# Patient Record
Sex: Female | Born: 1990 | Race: Black or African American | Hispanic: No | Marital: Single | Smoking: Former smoker
Health system: Southern US, Community
[De-identification: ages and names within clinical notes are randomized; demographics above are authoritative.]

## PROBLEM LIST (undated history)

## (undated) ENCOUNTER — Inpatient Hospital Stay (HOSPITAL_COMMUNITY): Payer: Self-pay

## (undated) DIAGNOSIS — E66813 Obesity, class 3: Secondary | ICD-10-CM

## (undated) DIAGNOSIS — Z973 Presence of spectacles and contact lenses: Secondary | ICD-10-CM

## (undated) DIAGNOSIS — Q513 Bicornate uterus: Secondary | ICD-10-CM

## (undated) DIAGNOSIS — S82153A Displaced fracture of unspecified tibial tuberosity, initial encounter for closed fracture: Secondary | ICD-10-CM

## (undated) DIAGNOSIS — O24419 Gestational diabetes mellitus in pregnancy, unspecified control: Secondary | ICD-10-CM

## (undated) DIAGNOSIS — O139 Gestational [pregnancy-induced] hypertension without significant proteinuria, unspecified trimester: Secondary | ICD-10-CM

## (undated) DIAGNOSIS — O1490 Unspecified pre-eclampsia, unspecified trimester: Secondary | ICD-10-CM

## (undated) HISTORY — PX: OPEN REDUCTION INTERNAL FIXATION (ORIF) TIBIAL TUBERCLE: SHX6482

## (undated) HISTORY — PX: WISDOM TOOTH EXTRACTION: SHX21

## (undated) HISTORY — DX: Gestational diabetes mellitus in pregnancy, unspecified control: O24.419

## (undated) HISTORY — DX: Gestational (pregnancy-induced) hypertension without significant proteinuria, unspecified trimester: O13.9

## (undated) HISTORY — DX: Unspecified pre-eclampsia, unspecified trimester: O14.90

---
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
## 2013-03-04 ENCOUNTER — Encounter (HOSPITAL_COMMUNITY): Payer: Self-pay | Admitting: Emergency Medicine

## 2013-03-04 ENCOUNTER — Emergency Department (HOSPITAL_COMMUNITY)
Admission: EM | Admit: 2013-03-04 | Discharge: 2013-03-04 | Disposition: A | Discharge status: Home / Self Care | Payer: Self-pay | Attending: Emergency Medicine | Admitting: Emergency Medicine

## 2013-03-04 DIAGNOSIS — F17200 Nicotine dependence, unspecified, uncomplicated: Secondary | ICD-10-CM | POA: Insufficient documentation

## 2013-03-04 DIAGNOSIS — K047 Periapical abscess without sinus: Secondary | ICD-10-CM | POA: Insufficient documentation

## 2013-03-04 MED ORDER — PENICILLIN V POTASSIUM 500 MG PO TABS: 500.0000 mg | ORAL_TABLET | Freq: Four times a day (QID) | ORAL | Status: AC

## 2013-03-04 MED ORDER — HYDROCODONE-ACETAMINOPHEN 5-325 MG PO TABS: 1.0000 | ORAL_TABLET | ORAL | Status: DC | PRN

## 2013-03-04 MED ORDER — HYDROCODONE-ACETAMINOPHEN 5-325 MG PO TABS
2.0000 | ORAL_TABLET | Freq: Once | ORAL | Status: AC
  Administered 2013-03-04: 2 via ORAL
  Filled 2013-03-04: qty 2

## 2013-03-04 NOTE — ED Notes (Signed)
Pt presents with swelling to right jaw area onset this morning. Pt denies injury. Reports that she had a toothache on same side earlier this week but no longer has the toothache. Pt tried over the counter pain reliever without relief.

## 2013-03-04 NOTE — ED Provider Notes (Signed)
CSN: [id number]     Arrival date & time 03/04/13  1152 History  This chart was scribed for non-physician practitioner, Coral Ceo, PA-C working with Juliet Rude. Rubin Payor, MD by Greggory Stallion, ED scribe. This patient was seen in room TR06C/TR06C and the patient's care was started at 1:41 PM.   Chief Complaint  Patient presents with  . Facial Swelling   The history is provided by the patient. No language interpreter was used.   HPI Comments: Tonya Walls is a 22 y.o. female with no PMH who presents to the Emergency Department complaining of swelling to her right jaw area that started earlier this morning. Denies injury or trauma to her face. States she had a toothache on the same side earlier this week but denies current dental pain. Pt has tried acetaminophen with no relief. Denies fever, chills, diaphoresis, abdominal pain, nausea, emesis, ear pain, sore throat, difficulty swallowing, cough, eye pain, neck pain, headache.  History reviewed. No pertinent past medical history. History reviewed. No pertinent past surgical history. No family history on file. History  Substance Use Topics  . Smoking status: Current Every Day Smoker  . Smokeless tobacco: Not on file  . Alcohol Use: No   OB History   Grav Para Term Preterm Abortions TAB SAB Ect Mult Living                 Review of Systems  Constitutional: Negative for fever, chills and diaphoresis.  HENT: Positive for dental problem and facial swelling (right jaw). Negative for ear pain, sore throat and trouble swallowing.   Eyes: Negative for pain.  Respiratory: Negative for cough.   Gastrointestinal: Negative for nausea, vomiting and abdominal pain.  Musculoskeletal: Negative for neck pain.  Neurological: Negative for headaches.  All other systems reviewed and are negative.   Allergies  Review of patient's allergies indicates no known allergies.  Home Medications  No current outpatient prescriptions on file.  BP 122/65   Pulse 114  Temp(Src) 99.6 F (37.6 C) (Oral)  Resp 20  Ht 5\' 2"  (1.575 m)  Wt 248 lb 5 oz (112.634 kg)  BMI 45.41 kg/m2  SpO2 98%  LMP 02/26/2013  Filed Vitals:   03/04/13 1202 03/04/13 1405  BP: 122/65 119/69  Pulse: 114 98  Temp: 99.6 F (37.6 C) 99.4 F (37.4 C)  TempSrc: Oral Oral  Resp: 20 15  Height: 5\' 2"  (1.575 m)   Weight: 248 lb 5 oz (112.634 kg)   SpO2: 98% 100%    Physical Exam  Nursing note and vitals reviewed. Constitutional: She is oriented to person, place, and time. She appears well-developed and well-nourished. No distress.  HENT:  Head: Normocephalic and atraumatic.    Right Ear: External ear normal.  Left Ear: External ear normal.  Nose: Nose normal.  Mouth/Throat: Oropharynx is clear and moist. No oropharyngeal exudate.    Tenderness to palpation to right mandible with surrounding edema, which does not go past the jaw line.  Dental cary present to the right lower molar.  Palpable dental abscess to the inner right lower jaw, which is indurated with mild fluctuance.  TM's gray and translucent.  No trismus.   Eyes: Conjunctivae and EOM are normal. Pupils are equal, round, and reactive to light.  Neck: Normal range of motion. Neck supple. No tracheal deviation present.  No submental fullness.  No LAD throughout.  No tenderness or edema to the neck throughout  Cardiovascular: Normal rate, regular rhythm and normal heart sounds.  Exam reveals no gallop and no friction rub.   No murmur heard. Pulmonary/Chest: Effort normal and breath sounds normal. No respiratory distress. She has no wheezes. She has no rhonchi. She has no rales. She exhibits no tenderness.  Abdominal: Soft. There is no tenderness.  Musculoskeletal: Normal range of motion. She exhibits no edema and no tenderness.  Neurological: She is alert and oriented to person, place, and time.  Skin: Skin is warm and dry.  Psychiatric: She has a normal mood and affect. Her behavior is normal.    ED  Course  Procedures (including critical care time)  DIAGNOSTIC STUDIES: Oxygen Saturation is 98% on RA, normal by my interpretation.    COORDINATION OF CARE: 1:47 PM-Discussed treatment plan which includes I&D and an antibiotic with pt at bedside and pt agreed to plan. Advised pt to follow up with a dentist.  INCISION AND DRAINAGE Performed by: Coral Ceo, PA-C Consent: Verbal consent obtained. Risks and benefits: risks, benefits and alternatives were discussed Type: abscess  Body area: right mandible  Anesthesia: local infiltration  Incision was made with a scalpel 11 blade   Local anesthetic: 0.5% bupivacaine   Anesthetic total: 0.5 ml  Drainage amount: none  Packing material: 1/4 in iodoform gauze  Patient tolerance: Patient tolerated the procedure well with no immediate complications.  Dental block successful for pain reduction.    Labs Review Labs Reviewed - No data to display Imaging Review No results found.  EKG Interpretation   None       MDM   Tonya Walls is a 22 y.o. female with no PMH who presents to the Emergency Department complaining of swelling to her right jaw area that started earlier this morning.    Rechecks  3:00 PM = Pain improved after dental block and Vicodin.     Etiology of facial swelling likely due to dental cary with developing abscess.  Unable to drain abscess, however, dental block led to a reduction in pain.  No concerning signs/symptoms for Ludwig's Angina at this time.  Patient afebrile and non-toxic in appearance.  Patient instructed to follow-up with a dentist for further evaluation and management.  Resources provided.  Return precautions were given.  Patient in agreement with discharge and plan. Visitor in ED to drive patient home.     Discharge Medication List as of 03/04/2013  2:54 PM    START taking these medications   Details  HYDROcodone-acetaminophen (NORCO/VICODIN) 5-325 MG per tablet Take 1-2 tablets by mouth  every 4 (four) hours as needed., Starting 03/04/2013, Until Discontinued, Print    penicillin v potassium (VEETID) 500 MG tablet Take 1 tablet (500 mg total) by mouth 4 (four) times daily., Starting 03/04/2013, Last dose on Sun 03/11/13, Print       Final impressions: 1. Dental abscess      Thomasenia Sales   I personally performed the services described in this documentation, which was scribed in my presence. The recorded information has been reviewed and is accurate.        Jillyn Ledger, PA-C 03/09/13 0030

## 2013-03-12 NOTE — ED Provider Notes (Signed)
Medical screening examination/treatment/procedure(s) were performed by non-physician practitioner and as supervising physician I was immediately available for consultation/collaboration.  EKG Interpretation   None        Kippy Melena R. Kross Swallows, MD 03/12/13 1542 

## 2013-12-07 ENCOUNTER — Encounter (HOSPITAL_COMMUNITY): Payer: Self-pay | Admitting: Emergency Medicine

## 2013-12-07 ENCOUNTER — Emergency Department (HOSPITAL_COMMUNITY)
Admission: EM | Admit: 2013-12-07 | Discharge: 2013-12-07 | Disposition: A | Discharge status: Home / Self Care | Payer: Self-pay | Attending: Emergency Medicine | Admitting: Emergency Medicine

## 2013-12-07 DIAGNOSIS — Z3202 Encounter for pregnancy test, result negative: Secondary | ICD-10-CM | POA: Insufficient documentation

## 2013-12-07 DIAGNOSIS — R109 Unspecified abdominal pain: Secondary | ICD-10-CM | POA: Insufficient documentation

## 2013-12-07 DIAGNOSIS — N926 Irregular menstruation, unspecified: Secondary | ICD-10-CM | POA: Insufficient documentation

## 2013-12-07 DIAGNOSIS — R112 Nausea with vomiting, unspecified: Secondary | ICD-10-CM | POA: Insufficient documentation

## 2013-12-07 DIAGNOSIS — Z87891 Personal history of nicotine dependence: Secondary | ICD-10-CM | POA: Insufficient documentation

## 2013-12-07 DIAGNOSIS — Z79899 Other long term (current) drug therapy: Secondary | ICD-10-CM | POA: Insufficient documentation

## 2013-12-07 DIAGNOSIS — R42 Dizziness and giddiness: Secondary | ICD-10-CM | POA: Insufficient documentation

## 2013-12-07 LAB — COMPREHENSIVE METABOLIC PANEL
ALBUMIN: 3.7 g/dL (ref 3.5–5.2)
ALT: 15 U/L (ref 0–35)
ANION GAP: 12 (ref 5–15)
AST: 14 U/L (ref 0–37)
Alkaline Phosphatase: 53 U/L (ref 39–117)
BUN: 15 mg/dL (ref 6–23)
CALCIUM: 9.2 mg/dL (ref 8.4–10.5)
CHLORIDE: 104 meq/L (ref 96–112)
CO2: 21 mEq/L (ref 19–32)
CREATININE: 0.61 mg/dL (ref 0.50–1.10)
GFR calc Af Amer: >90 mL/min (ref >90)
GFR calc non Af Amer: >90 mL/min (ref >90)
Glucose, Bld: 95 mg/dL (ref 70–99)
Potassium: 4 mEq/L (ref 3.7–5.3)
Sodium: 137 mEq/L (ref 137–147)
TOTAL PROTEIN: 7.5 g/dL (ref 6.0–8.3)
Total Bilirubin: 0.3 mg/dL (ref 0.3–1.2)

## 2013-12-07 LAB — CBC WITH DIFFERENTIAL/PLATELET
BASOS ABS: 0 10*3/uL (ref 0.0–0.1)
BASOS PCT: 0 % (ref 0–1)
EOS ABS: 0.1 10*3/uL (ref 0.0–0.7)
Eosinophils Relative: 2 % (ref 0–5)
HEMATOCRIT: 32.1 % — AB (ref 36.0–46.0)
HEMOGLOBIN: 10 g/dL — AB (ref 12.0–15.0)
Lymphocytes Relative: 26 % (ref 12–46)
Lymphs Abs: 1.3 10*3/uL (ref 0.7–4.0)
MCH: 24 pg — AB (ref 26.0–34.0)
MCHC: 31.2 g/dL (ref 30.0–36.0)
MCV: 77.2 fL — ABNORMAL LOW (ref 78.0–100.0)
MONO ABS: 0.4 10*3/uL (ref 0.1–1.0)
MONOS PCT: 8 % (ref 3–12)
NEUTROS ABS: 3.1 10*3/uL (ref 1.7–7.7)
Neutrophils Relative %: 64 % (ref 43–77)
Platelets: 345 10*3/uL (ref 150–400)
RBC: 4.16 MIL/uL (ref 3.87–5.11)
RDW: 16.2 % — AB (ref 11.5–15.5)
WBC: 4.9 10*3/uL (ref 4.0–10.5)

## 2013-12-07 LAB — URINALYSIS, ROUTINE W REFLEX MICROSCOPIC
BILIRUBIN URINE: NEGATIVE
Glucose, UA: NEGATIVE mg/dL
Hgb urine dipstick: NEGATIVE
Ketones, ur: 15 mg/dL — AB
NITRITE: NEGATIVE
PH: 5.5 (ref 5.0–8.0)
Protein, ur: NEGATIVE mg/dL
SPECIFIC GRAVITY, URINE: 1.03 (ref 1.005–1.030)
UROBILINOGEN UA: 0.2 mg/dL (ref 0.0–1.0)

## 2013-12-07 LAB — POC URINE PREG, ED: PREG TEST UR: NEGATIVE

## 2013-12-07 LAB — URINE MICROSCOPIC-ADD ON

## 2013-12-07 MED ORDER — OMEPRAZOLE 20 MG PO CPDR: 20.0000 mg | DELAYED_RELEASE_CAPSULE | Freq: Every day | ORAL | Status: DC

## 2013-12-07 MED ORDER — SODIUM CHLORIDE 0.9 % IV BOLUS (SEPSIS)
1000.0000 mL | Freq: Once | INTRAVENOUS | Status: AC
  Administered 2013-12-07: 1000 mL via INTRAVENOUS

## 2013-12-07 MED ORDER — ONDANSETRON HCL 4 MG/2ML IJ SOLN
4.0000 mg | Freq: Once | INTRAMUSCULAR | Status: AC
  Administered 2013-12-07: 4 mg via INTRAVENOUS
  Filled 2013-12-07: qty 2

## 2013-12-07 NOTE — ED Provider Notes (Signed)
Pt seen and evaluated.  Most symptoms arising since missing period for last 5-7 days.  Benign abdomen.  Not pregnant. Plan is PPI, expectant management.  PCP f/U.  Pt discussed with Resident. I agree with Dr. Roxan Hockey assessment and plan.  Rolland Porter, MD 12/07/13 1425

## 2013-12-07 NOTE — ED Notes (Signed)
Pt reports she missed her period. States for the last few weeks has been having abdominal pain, breast tenderness and nausea. Pt awake, alert, oriented x4, VSS.

## 2013-12-07 NOTE — ED Provider Notes (Signed)
CSN: [id number]     Arrival date & time 12/07/13  1122 History   First MD Initiated Contact with Patient 12/07/13 1151     Chief Complaint  Patient presents with  . Abdominal Pain     (Consider location/radiation/quality/duration/timing/severity/associated sxs/prior Treatment) Patient is a 23 y.o. female presenting with abdominal pain.  Abdominal Pain Associated symptoms: nausea and vomiting   Associated symptoms: no fever and no vaginal discharge    Pt is a 23 y/o female w/ no significant PMHx who presents to the ED w/ abdominal pain, vomiting, breast tenderness, and light headedness. Pt believes that she could be pregnant but has not used a home pregnancy test. Her last menstrual cycle was August 20th, and they are usually regular. She states she has been vomiting clear liquids x 1 week. Her breast have been tender x 1 week as well and she denies recent trauma to chest wall area or previous hx of breast tenderness. She also has been feeling light headed x 1 week and feels that the room is spinning. She denies any changes in appetite and has been able to keep down fluids. Her abdominal pain originated at there left lower quadrant and is not located along lower abdomen which has been present for 1 week as well.  She is sexually active and does not use condoms consistently, she is not on birth control. Denies vaginal discharge, fevers, and chills.    History reviewed. No pertinent past medical history. History reviewed. No pertinent past surgical history. No family history on file. History  Substance Use Topics  . Smoking status: Former Smoker    Quit date: 11/13/2012  . Smokeless tobacco: Not on file  . Alcohol Use: No   OB History   Grav Para Term Preterm Abortions TAB SAB Ect Mult Living                 Review of Systems  Constitutional: Negative for fever and appetite change.  Gastrointestinal: Positive for nausea, vomiting and abdominal pain.  Genitourinary: Negative for vaginal  discharge.  Neurological: Positive for light-headedness.      Allergies  Review of patient's allergies indicates no known allergies.  Home Medications   Prior to Admission medications   Medication Sig Start Date End Date Taking? Authorizing Provider  ibuprofen (ADVIL,MOTRIN) 200 MG tablet Take 600 mg by mouth every 6 (six) hours as needed (pain).   Yes Historical Provider, MD  omeprazole (PRILOSEC) 20 MG capsule Take 1 capsule (20 mg total) by mouth daily. 12/07/13   Gara Kroner, MD   BP 109/58  Pulse 84  Temp(Src) 98.5 F (36.9 C) (Oral)  Resp 20  SpO2 100%  LMP 11/01/2013 Physical Exam  Constitutional: She appears well-developed and well-nourished. No distress.  HENT:  Right Ear: External ear normal.  Left Ear: External ear normal.  Cardiovascular: Regular rhythm.   No murmur heard. Tachycardic   Pulmonary/Chest: Effort normal and breath sounds normal. She has no wheezes.  Abdominal: Soft. Bowel sounds are normal. There is tenderness (generalized). There is no rebound and no guarding.  Musculoskeletal: She exhibits no edema.    ED Course  Procedures (including critical care time) Labs Review Labs Reviewed  CBC WITH DIFFERENTIAL - Abnormal; Notable for the following:    Hemoglobin 10.0 (*)    HCT 32.1 (*)    MCV 77.2 (*)    MCH 24.0 (*)    RDW 16.2 (*)    All other components within normal limits  URINALYSIS, ROUTINE  W REFLEX MICROSCOPIC - Abnormal; Notable for the following:    APPearance HAZY (*)    Ketones, ur 15 (*)    Leukocytes, UA SMALL (*)    All other components within normal limits  URINE MICROSCOPIC-ADD ON - Abnormal; Notable for the following:    Squamous Epithelial / LPF MANY (*)    Bacteria, UA MANY (*)    All other components within normal limits  COMPREHENSIVE METABOLIC PANEL  POC URINE PREG, ED      MDM   Final diagnoses:  Irregular menstrual cycle  Abdominal pain in female    Pt presents to the ED w/ abdominal pain, breast  tenderness, light headedness, and vomiting x 1 week. Urine pregnancy test was negative thus r/o's ectopy and molar pregnancy. Pt could be having an irregular menstrual cycle since she is ~5 days late on her period thus causing the above symptoms, or GERD. Will check CBC and CMP. If WNL pt stable for d/c home w/ reflux meds.   UA positive for LE but likely contaminated considering many squamous epi cells were seen as well and pt denies dysuria. Hgb low but stable. Will d/c home w/ prilosec  once a day.    Gara Kroner, MD 12/07/13 [phone]

## 2013-12-07 NOTE — Discharge Instructions (Signed)
You can start taking omeprazole  once a day for acid reflux that could be causing you to feel nauseous.      Gastroesophageal Reflux Disease, Adult Gastroesophageal reflux disease (GERD) happens when acid from your stomach goes into your food pipe (esophagus). The acid can cause a burning feeling in your chest. Over time, the acid can make small holes (ulcers) in your food pipe.  HOME CARE  Ask your doctor for advice about:  Losing weight.  Quitting smoking.  Alcohol use.  Avoid foods and drinks that make your problems worse. You may want to avoid:  Caffeine and alcohol.  Chocolate.  Mints.  Garlic and onions.  Spicy foods.  Citrus fruits, such as oranges, lemons, or limes.  Foods that contain tomato, such as sauce, chili, salsa, and pizza.  Fried and fatty foods.  Avoid lying down for 3 hours before you go to bed or before you take a nap.  Eat small meals often, instead of large meals.  Wear loose-fitting clothing. Do not wear anything tight around your waist.  Raise (elevate) the head of your bed 6 to 8 inches with wood blocks. Using extra pillows does not help.  Only take medicines as told by your doctor.  Do not take aspirin or ibuprofen. GET HELP RIGHT AWAY IF:   You have pain in your arms, neck, jaw, teeth, or back.  Your pain gets worse or changes.  You feel sick to your stomach (nauseous), throw up (vomit), or sweat (diaphoresis).  You feel short of breath, or you pass out (faint).  Your throw up is green, yellow, black, or looks like coffee grounds or blood.  Your poop (stool) is red, bloody, or black. MAKE SURE YOU:   Understand these instructions.  Will watch your condition.  Will get help right away if you are not doing well or get worse. Document Released: 08/18/2007 Document Revised: 05/24/2011 Document Reviewed: 09/18/2010 [facility] Patient Information 2015 [place], [place]. This information is not intended to replace advice given to  you by your health care provider. Make sure you discuss any questions you have with your health care provider.

## 2013-12-09 NOTE — ED Provider Notes (Signed)
I saw and evaluated the patient, reviewed the resident's note and I agree with the findings and plan.   EKG Interpretation None         Rolland Porter, MD 12/09/13 1537

## 2014-03-15 ENCOUNTER — Encounter (HOSPITAL_COMMUNITY): Payer: Self-pay | Admitting: Emergency Medicine

## 2014-03-15 ENCOUNTER — Emergency Department (HOSPITAL_COMMUNITY)
Admission: EM | Admit: 2014-03-15 | Discharge: 2014-03-15 | Disposition: A | Discharge status: Home / Self Care | Payer: Self-pay | Attending: Emergency Medicine | Admitting: Emergency Medicine

## 2014-03-15 ENCOUNTER — Emergency Department (HOSPITAL_COMMUNITY): Payer: Self-pay

## 2014-03-15 DIAGNOSIS — R197 Diarrhea, unspecified: Secondary | ICD-10-CM | POA: Insufficient documentation

## 2014-03-15 DIAGNOSIS — Z3202 Encounter for pregnancy test, result negative: Secondary | ICD-10-CM | POA: Insufficient documentation

## 2014-03-15 DIAGNOSIS — Z87891 Personal history of nicotine dependence: Secondary | ICD-10-CM | POA: Insufficient documentation

## 2014-03-15 DIAGNOSIS — R112 Nausea with vomiting, unspecified: Secondary | ICD-10-CM | POA: Insufficient documentation

## 2014-03-15 DIAGNOSIS — Z79899 Other long term (current) drug therapy: Secondary | ICD-10-CM | POA: Insufficient documentation

## 2014-03-15 DIAGNOSIS — E876 Hypokalemia: Secondary | ICD-10-CM | POA: Insufficient documentation

## 2014-03-15 DIAGNOSIS — R059 Cough, unspecified: Secondary | ICD-10-CM

## 2014-03-15 DIAGNOSIS — R05 Cough: Secondary | ICD-10-CM | POA: Insufficient documentation

## 2014-03-15 DIAGNOSIS — E871 Hypo-osmolality and hyponatremia: Secondary | ICD-10-CM | POA: Insufficient documentation

## 2014-03-15 LAB — CBC WITH DIFFERENTIAL/PLATELET
BASOS ABS: 0 10*3/uL (ref 0.0–0.1)
Basophils Relative: 0 % (ref 0–1)
Eosinophils Absolute: 0 10*3/uL (ref 0.0–0.7)
Eosinophils Relative: 1 % (ref 0–5)
HCT: 34.2 % — ABNORMAL LOW (ref 36.0–46.0)
Hemoglobin: 11 g/dL — ABNORMAL LOW (ref 12.0–15.0)
LYMPHS PCT: 34 % (ref 12–46)
Lymphs Abs: 1.3 10*3/uL (ref 0.7–4.0)
MCH: 25.8 pg — ABNORMAL LOW (ref 26.0–34.0)
MCHC: 32.2 g/dL (ref 30.0–36.0)
MCV: 80.1 fL (ref 78.0–100.0)
Monocytes Absolute: 0.7 10*3/uL (ref 0.1–1.0)
Monocytes Relative: 17 % — ABNORMAL HIGH (ref 3–12)
NEUTROS ABS: 1.9 10*3/uL (ref 1.7–7.7)
Neutrophils Relative %: 48 % (ref 43–77)
PLATELETS: 366 10*3/uL (ref 150–400)
RBC: 4.27 MIL/uL (ref 3.87–5.11)
RDW: 15.9 % — AB (ref 11.5–15.5)
WBC: 3.9 10*3/uL — AB (ref 4.0–10.5)

## 2014-03-15 LAB — COMPREHENSIVE METABOLIC PANEL
ALBUMIN: 3.8 g/dL (ref 3.5–5.2)
ALT: 15 U/L (ref 0–35)
ANION GAP: 7 (ref 5–15)
AST: 24 U/L (ref 0–37)
Alkaline Phosphatase: 55 U/L (ref 39–117)
BUN: 9 mg/dL (ref 6–23)
CO2: 24 mmol/L (ref 19–32)
Calcium: 9.1 mg/dL (ref 8.4–10.5)
Chloride: 108 mEq/L (ref 96–112)
Creatinine, Ser: 0.7 mg/dL (ref 0.50–1.10)
Glucose, Bld: 106 mg/dL — ABNORMAL HIGH (ref 70–99)
POTASSIUM: 2.8 mmol/L — AB (ref 3.5–5.1)
SODIUM: 139 mmol/L (ref 135–145)
Total Bilirubin: 0.4 mg/dL (ref 0.3–1.2)
Total Protein: 7.5 g/dL (ref 6.0–8.3)

## 2014-03-15 LAB — LIPASE, BLOOD: Lipase: 22 U/L (ref 11–59)

## 2014-03-15 LAB — POC URINE PREG, ED: Preg Test, Ur: NEGATIVE

## 2014-03-15 MED ORDER — POTASSIUM CHLORIDE CRYS ER 20 MEQ PO TBCR
40.0000 meq | EXTENDED_RELEASE_TABLET | Freq: Once | ORAL | Status: AC
  Administered 2014-03-15: 40 meq via ORAL
  Filled 2014-03-15: qty 2

## 2014-03-15 MED ORDER — LACTATED RINGERS IV BOLUS (SEPSIS)
1000.0000 mL | Freq: Once | INTRAVENOUS | Status: AC
  Administered 2014-03-15: 1000 mL via INTRAVENOUS

## 2014-03-15 MED ORDER — ONDANSETRON HCL 4 MG PO TABS: 4.0000 mg | ORAL_TABLET | Freq: Four times a day (QID) | ORAL | Status: DC

## 2014-03-15 MED ORDER — SODIUM CHLORIDE 0.9 % IV BOLUS (SEPSIS)
1000.0000 mL | Freq: Once | INTRAVENOUS | Status: AC
  Administered 2014-03-15: 1000 mL via INTRAVENOUS

## 2014-03-15 MED ORDER — ONDANSETRON HCL 4 MG/2ML IJ SOLN
4.0000 mg | Freq: Once | INTRAMUSCULAR | Status: AC
  Administered 2014-03-15: 4 mg via INTRAVENOUS
  Filled 2014-03-15: qty 2

## 2014-03-15 NOTE — ED Notes (Signed)
Pt c/o N/V x 3 days with cough; pt c/o generalized weakness

## 2014-03-15 NOTE — ED Notes (Signed)
Reports she is feeling much improved, ready to go home.  MD aware.

## 2014-03-15 NOTE — ED Notes (Signed)
Patient transported to X-ray 

## 2014-03-15 NOTE — ED Notes (Signed)
Lactated ringers infused.  nss hung wo

## 2014-03-15 NOTE — ED Provider Notes (Signed)
CSN: [id number]     Arrival date & time 03/15/14  1626 History   First MD Initiated Contact with Patient 03/15/14 1856     Chief Complaint  Patient presents with  . Emesis  . Cough     (Consider location/radiation/quality/duration/timing/severity/associated sxs/prior Treatment) Patient is a 24 y.o. female presenting with GI illness. The history is provided by the patient.  GI Problem This is a new problem. Episode onset: 2 days ago. The problem occurs constantly. The problem has been unchanged. Associated symptoms include coughing (nonproductive), nausea and vomiting (NBNB). Pertinent negatives include no abdominal pain, arthralgias, chest pain, chills, diaphoresis, fatigue, fever, headaches, myalgias, rash, sore throat or weakness. The symptoms are aggravated by eating. She has tried nothing for the symptoms. The treatment provided no relief.    History reviewed. No pertinent past medical history. History reviewed. No pertinent past surgical history. History reviewed. No pertinent family history. History  Substance Use Topics  . Smoking status: Former Smoker    Quit date: 11/13/2012  . Smokeless tobacco: Not on file  . Alcohol Use: No   OB History    No data available     Review of Systems  Constitutional: Negative for fever, chills, diaphoresis, activity change, appetite change and fatigue.  HENT: Negative for ear pain, facial swelling, rhinorrhea, sore throat, trouble swallowing and voice change.   Eyes: Negative for photophobia, pain and visual disturbance.  Respiratory: Positive for cough (nonproductive). Negative for shortness of breath, wheezing and stridor.   Cardiovascular: Negative for chest pain, palpitations and leg swelling.  Gastrointestinal: Positive for nausea and vomiting (NBNB). Negative for abdominal pain, constipation and anal bleeding.  Endocrine: Negative.   Genitourinary: Negative for dysuria, vaginal bleeding, vaginal discharge and vaginal pain.   Musculoskeletal: Negative for myalgias, back pain and arthralgias.  Skin: Negative.  Negative for rash.  Allergic/Immunologic: Negative.   Neurological: Negative for dizziness, tremors, syncope, weakness and headaches.  Psychiatric/Behavioral: Negative for suicidal ideas, sleep disturbance and self-injury.  All other systems reviewed and are negative.     Allergies  Review of patient's allergies indicates no known allergies.  Home Medications   Prior to Admission medications   Medication Sig Start Date End Date Taking? Authorizing Provider  ibuprofen (ADVIL,MOTRIN) 200 MG tablet Take 600 mg by mouth every 6 (six) hours as needed (pain).   Yes Historical Provider, MD  omeprazole (PRILOSEC) 20 MG capsule Take 1 capsule (20 mg total) by mouth daily. 12/07/13   Gara Kroner, MD  ondansetron (ZOFRAN) 4 MG tablet Take 1 tablet (4 mg total) by mouth every 6 (six) hours. 03/15/14   Lula Olszewski, MD   BP 102/51 mmHg  Pulse 99  Temp(Src) 100.7 F (38.2 C) (Oral)  Resp 24  Ht  (1.575 m)  SpO2 97%  LMP 03/05/2014 (Approximate) Physical Exam  Constitutional: She is oriented to person, place, and time. She appears well-developed and well-nourished. No distress.  HENT:  Head: Normocephalic and atraumatic.  Right Ear: External ear normal.  Left Ear: External ear normal.  Mouth/Throat: Oropharynx is clear and moist. No oropharyngeal exudate.  Eyes: Conjunctivae and EOM are normal. Pupils are equal, round, and reactive to light. No scleral icterus.  Neck: Normal range of motion. Neck supple. No JVD present. No tracheal deviation present. No thyromegaly present.  Cardiovascular: Normal rate, regular rhythm and intact distal pulses.  Exam reveals no gallop and no friction rub.   No murmur heard. Pulmonary/Chest: Effort normal and breath sounds normal. No respiratory distress.  She has no wheezes. She has no rales.  Abdominal: Soft. Bowel sounds are normal. She exhibits no distension. There is  tenderness (mild tenderness diffusely).  Musculoskeletal: Normal range of motion. She exhibits no edema or tenderness.  Neurological: She is alert and oriented to person, place, and time. No cranial nerve deficit. She exhibits normal muscle tone. Coordination normal.  5/5 strength in all 4 extremities. Normal Gait.   Skin: Skin is warm and dry. She is not diaphoretic. No pallor.  Psychiatric: She has a normal mood and affect. She expresses no homicidal and no suicidal ideation. She expresses no suicidal plans and no homicidal plans.  Nursing note and vitals reviewed.   ED Course  Procedures (including critical care time) Labs Review Labs Reviewed  CBC WITH DIFFERENTIAL - Abnormal; Notable for the following:    WBC 3.9 (*)    Hemoglobin 11.0 (*)    HCT 34.2 (*)    MCH 25.8 (*)    RDW 15.9 (*)    Monocytes Relative 17 (*)    All other components within normal limits  COMPREHENSIVE METABOLIC PANEL - Abnormal; Notable for the following:    Potassium 2.8 (*)    Glucose, Bld 106 (*)    All other components within normal limits  LIPASE, BLOOD  POC URINE PREG, ED    Imaging Review Dg Chest 2 View  03/15/2014   CLINICAL DATA:  One week history of cough, sore throat and fever. Current smoker.  EXAM: CHEST  2 VIEW  COMPARISON:  None.  FINDINGS: Suboptimal inspiration due to body habitus accounts for crowded bronchovascular markings, especially in the bases, and accentuates the cardiac silhouette. Patent as into account, cardiomediastinal silhouette unremarkable. Lungs clear. Bronchovascular markings normal. Pulmonary vascularity normal. No visible pleural effusions. No pneumothorax. Visualized bony thorax intact.  IMPRESSION: Suboptimal inspiration.  No acute cardiopulmonary disease.   Electronically Signed   By: Hulan Saas M.D.   On: 03/15/2014 19:21     EKG Interpretation None      MDM   Final diagnoses:  Cough  Nausea vomiting and diarrhea    The patient is a 24 y.o. F who  presents for 3 days of cough, N/V/D. Brother has the same symptoms. Patient non-toxic appearing. No further episodes of emesis after receiving zofran in the ED. Patient noted to be hypokalemic and hyponatremic, repleated in the ED. Patient appears well. No signs of toxicity, patient is interactive and playful. Not in distress. No signs of clinical dehydration. Doubt appendicitis, bowel obstruction, intussusception and no evidence of any other illness. History does not support DKA.  Discussed symptomatic treatment with the parents and they will follow closely with their PCP.  I have alerted the parents to seek further medical attention for dehydration, marked weakness, fainting, increased abdominal pain, blood in stool or vomit. They are agreeable with discharge plan and understanding of return precautions.  Patient seen with attending, Dr. Rubin Payor, who oversaw clinical decision making.     Lula Olszewski, MD 03/15/14 1610  Juliet Rude. Rubin Payor, MD 03/18/14 [phone]

## 2014-05-11 ENCOUNTER — Encounter (HOSPITAL_COMMUNITY): Payer: Self-pay | Admitting: *Deleted

## 2014-05-11 ENCOUNTER — Emergency Department (HOSPITAL_COMMUNITY)
Admission: EM | Admit: 2014-05-11 | Discharge: 2014-05-12 | Disposition: A | Discharge status: Home / Self Care | Payer: Self-pay | Attending: Emergency Medicine | Admitting: Emergency Medicine

## 2014-05-11 DIAGNOSIS — H9202 Otalgia, left ear: Secondary | ICD-10-CM | POA: Insufficient documentation

## 2014-05-11 DIAGNOSIS — Z3202 Encounter for pregnancy test, result negative: Secondary | ICD-10-CM | POA: Insufficient documentation

## 2014-05-11 DIAGNOSIS — D649 Anemia, unspecified: Secondary | ICD-10-CM

## 2014-05-11 DIAGNOSIS — K0889 Other specified disorders of teeth and supporting structures: Secondary | ICD-10-CM

## 2014-05-11 DIAGNOSIS — Z87891 Personal history of nicotine dependence: Secondary | ICD-10-CM | POA: Insufficient documentation

## 2014-05-11 DIAGNOSIS — E876 Hypokalemia: Secondary | ICD-10-CM

## 2014-05-11 DIAGNOSIS — Z79899 Other long term (current) drug therapy: Secondary | ICD-10-CM | POA: Insufficient documentation

## 2014-05-11 DIAGNOSIS — K088 Other specified disorders of teeth and supporting structures: Secondary | ICD-10-CM | POA: Insufficient documentation

## 2014-05-11 DIAGNOSIS — K029 Dental caries, unspecified: Secondary | ICD-10-CM | POA: Insufficient documentation

## 2014-05-11 DIAGNOSIS — R112 Nausea with vomiting, unspecified: Secondary | ICD-10-CM

## 2014-05-11 LAB — COMPREHENSIVE METABOLIC PANEL
ALK PHOS: 51 U/L (ref 39–117)
ALT: 16 U/L (ref 0–35)
ANION GAP: 5 (ref 5–15)
AST: 19 U/L (ref 0–37)
Albumin: 3.6 g/dL (ref 3.5–5.2)
BUN: 14 mg/dL (ref 6–23)
CALCIUM: 8.7 mg/dL (ref 8.4–10.5)
CHLORIDE: 107 mmol/L (ref 96–112)
CO2: 25 mmol/L (ref 19–32)
Creatinine, Ser: 0.59 mg/dL (ref 0.50–1.10)
GLUCOSE: 101 mg/dL — AB (ref 70–99)
Potassium: 3.2 mmol/L — ABNORMAL LOW (ref 3.5–5.1)
SODIUM: 137 mmol/L (ref 135–145)
TOTAL PROTEIN: 6.9 g/dL (ref 6.0–8.3)
Total Bilirubin: 0.4 mg/dL (ref 0.3–1.2)

## 2014-05-11 LAB — URINE MICROSCOPIC-ADD ON

## 2014-05-11 LAB — URINALYSIS, ROUTINE W REFLEX MICROSCOPIC
GLUCOSE, UA: NEGATIVE mg/dL
Hgb urine dipstick: NEGATIVE
Ketones, ur: 15 mg/dL — AB
Nitrite: NEGATIVE
PH: 5.5 (ref 5.0–8.0)
PROTEIN: 30 mg/dL — AB
Specific Gravity, Urine: 1.04 — ABNORMAL HIGH (ref 1.005–1.030)
Urobilinogen, UA: 1 mg/dL (ref 0.0–1.0)

## 2014-05-11 LAB — CBC WITH DIFFERENTIAL/PLATELET
BASOS ABS: 0 10*3/uL (ref 0.0–0.1)
BASOS PCT: 0 % (ref 0–1)
EOS PCT: 1 % (ref 0–5)
Eosinophils Absolute: 0.1 10*3/uL (ref 0.0–0.7)
HCT: 31.5 % — ABNORMAL LOW (ref 36.0–46.0)
Hemoglobin: 9.8 g/dL — ABNORMAL LOW (ref 12.0–15.0)
LYMPHS ABS: 1.4 10*3/uL (ref 0.7–4.0)
LYMPHS PCT: 35 % (ref 12–46)
MCH: 25.3 pg — AB (ref 26.0–34.0)
MCHC: 31.1 g/dL (ref 30.0–36.0)
MCV: 81.2 fL (ref 78.0–100.0)
MONOS PCT: 13 % — AB (ref 3–12)
Monocytes Absolute: 0.5 10*3/uL (ref 0.1–1.0)
NEUTROS ABS: 2 10*3/uL (ref 1.7–7.7)
Neutrophils Relative %: 51 % (ref 43–77)
Platelets: 321 10*3/uL (ref 150–400)
RBC: 3.88 MIL/uL (ref 3.87–5.11)
RDW: 16.9 % — ABNORMAL HIGH (ref 11.5–15.5)
WBC: 4 10*3/uL (ref 4.0–10.5)

## 2014-05-11 LAB — POC URINE PREG, ED: Preg Test, Ur: NEGATIVE

## 2014-05-11 LAB — LIPASE, BLOOD: Lipase: 21 U/L (ref 11–59)

## 2014-05-11 MED ORDER — PROMETHAZINE HCL 12.5 MG PO TABS
12.5000 mg | ORAL_TABLET | Freq: Once | ORAL | Status: AC
  Administered 2014-05-11: 12.5 mg via ORAL
  Filled 2014-05-11: qty 1

## 2014-05-11 MED ORDER — ONDANSETRON 4 MG PO TBDP
8.0000 mg | ORAL_TABLET | Freq: Once | ORAL | Status: AC
  Administered 2014-05-11: 8 mg via ORAL

## 2014-05-11 MED ORDER — OXYCODONE-ACETAMINOPHEN 5-325 MG PO TABS
1.0000 | ORAL_TABLET | Freq: Once | ORAL | Status: AC
  Administered 2014-05-11: 1 via ORAL
  Filled 2014-05-11: qty 1

## 2014-05-11 MED ORDER — ONDANSETRON 4 MG PO TBDP
ORAL_TABLET | ORAL | Status: AC
  Filled 2014-05-11: qty 2

## 2014-05-11 MED ORDER — PENICILLIN V POTASSIUM 250 MG PO TABS
500.0000 mg | ORAL_TABLET | Freq: Once | ORAL | Status: AC
Start: 2014-05-11 — End: 2014-05-11
  Administered 2014-05-11: 500 mg via ORAL
  Filled 2014-05-11: qty 2

## 2014-05-11 NOTE — ED Notes (Signed)
Patient presents stating that she has had N/V over the past couple of days.  Denies diarrhea

## 2014-05-11 NOTE — ED Provider Notes (Signed)
CSN: [id number]     Arrival date & time 05/11/14  2155 History  This chart was scribed for Tonya Pel, PA-C with Vanetta Mulders, MD by Tonye Royalty, ED Scribe. This patient was seen in room TR06C/TR06C and the patient's care was started at 11:20 PM.    Chief Complaint  Patient presents with  . Emesis   HPI  HPI Comments: Tonya Walls is a 24 y.o. female who presents to the Emergency Department complaining of nausea and vomiting with onset 3 days ago. She states she has not been able to tolerate solids but has been able to drink fluids. She states sweet smells and smells of food worsen her symptoms. She reports associated sore throat that has resolved. She states her twin brother is having similar symptoms and is here to be seen as well. She also complains of dental pain to bottom left molar with onset a few weeks ago. She has not seen dentist yet and requests referral. She reports ear pain on left side.  She states she has not had diarrhea because she has not been having many bowel movements. She denies coughing or abdominal pain.  History reviewed. No pertinent past medical history. History reviewed. No pertinent past surgical history. No family history on file. History  Substance Use Topics  . Smoking status: Former Smoker    Quit date: 11/13/2012  . Smokeless tobacco: Never Used  . Alcohol Use: No   OB History    No data available     Review of Systems  Constitutional: Positive for appetite change.  HENT: Positive for dental problem, ear pain and sore throat.   Respiratory: Negative for cough.   Gastrointestinal: Positive for nausea and vomiting. Negative for abdominal pain and diarrhea.  All other systems reviewed and are negative.     Allergies  Review of patient's allergies indicates no known allergies.  Home Medications   Prior to Admission medications   Medication Sig Start Date End Date Taking? Authorizing Provider  ibuprofen (ADVIL,MOTRIN) 200 MG tablet Take  600 mg by mouth every 6 (six) hours as needed (pain).    Historical Provider, MD  metoCLOPramide (REGLAN) 10 MG tablet Take 1 tablet (10 mg total) by mouth every 6 (six) hours. 05/12/14   Joclynn Lumb Irine Seal, PA-C  omeprazole (PRILOSEC) 20 MG capsule Take 1 capsule (20 mg total) by mouth daily. 12/07/13   Gara Kroner, MD  ondansetron (ZOFRAN) 4 MG tablet Take 1 tablet (4 mg total) by mouth every 6 (six) hours. 03/15/14   Lula Olszewski, MD  penicillin v potassium (VEETID) 500 MG tablet Take 1 tablet (500 mg total) by mouth 3 (three) times daily. 05/12/14   Xitlali Kastens Irine Seal, PA-C  traMADol (ULTRAM) 50 MG tablet Take 1 tablet (50 mg total) by mouth every 6 (six) hours as needed. 05/12/14   Jaysiah Marchetta Irine Seal, PA-C   BP 111/60 mmHg  Pulse 79  Temp(Src) 98.4 F (36.9 C) (Oral)  Resp 18  Ht  (1.575 m)  Wt 248 lb 2 oz (112.549 kg)  BMI 45.37 kg/m2  LMP 04/26/2014 Physical Exam  Constitutional: She is oriented to person, place, and time. She appears well-developed and well-nourished. No distress.  HENT:  Head: Normocephalic and atraumatic.  Mouth/Throat: Uvula is midline, oropharynx is clear and moist and mucous membranes are normal. Normal dentition. Dental caries (Pts tooth shows no obvious abscess but moderate to severe tenderness to palpation of marked tooth) present. No uvula swelling.    Eyes: Conjunctivae  are normal. Pupils are equal, round, and reactive to light.  Neck: Trachea normal, normal range of motion and full passive range of motion without pain. Neck supple. No spinous process tenderness and no muscular tenderness present. Normal range of motion present.  Cardiovascular: Normal rate, regular rhythm, normal heart sounds and normal pulses.   Pulmonary/Chest: Effort normal and breath sounds normal. No respiratory distress. Chest wall is not dull to percussion. She exhibits no tenderness, no crepitus, no edema, no deformity and no retraction.  Abdominal: Normal appearance and bowel sounds  are normal. She exhibits no distension and no fluid wave. There is no tenderness. There is no rigidity, no rebound, no guarding and no CVA tenderness.  Musculoskeletal: Normal range of motion.  Neurological: She is alert and oriented to person, place, and time. She has normal strength.  Skin: Skin is warm, dry and intact. She is not diaphoretic.  Psychiatric: She has a normal mood and affect. Her speech is normal. Cognition and memory are normal.  Nursing note and vitals reviewed.   ED Course  Procedures (including critical care time)  COORDINATION OF CARE: 11:24 PM Discussed treatment plan with patient at beside, the patient agrees with the plan and has no further questions at this time.   Labs Review Labs Reviewed  URINALYSIS, ROUTINE W REFLEX MICROSCOPIC - Abnormal; Notable for the following:    Color, Urine AMBER (*)    APPearance TURBID (*)    Specific Gravity, Urine 1.040 (*)    Bilirubin Urine SMALL (*)    Ketones, ur 15 (*)    Protein, ur 30 (*)    Leukocytes, UA TRACE (*)    All other components within normal limits  CBC WITH DIFFERENTIAL/PLATELET - Abnormal; Notable for the following:    Hemoglobin 9.8 (*)    HCT 31.5 (*)    MCH 25.3 (*)    RDW 16.9 (*)    Monocytes Relative 13 (*)    All other components within normal limits  COMPREHENSIVE METABOLIC PANEL - Abnormal; Notable for the following:    Potassium 3.2 (*)    Glucose, Bld 101 (*)    All other components within normal limits  URINE MICROSCOPIC-ADD ON - Abnormal; Notable for the following:    Squamous Epithelial / LPF MANY (*)    Bacteria, UA MANY (*)    All other components within normal limits  LIPASE, BLOOD  POC URINE PREG, ED    Imaging Review No results found.   EKG Interpretation None      MDM   Final diagnoses:  Non-intractable vomiting with nausea, vomiting of unspecified type  Hypokalemia  Toothache   Medications  ondansetron (ZOFRAN-ODT) disintegrating tablet 8 mg (8 mg Oral  Given 05/11/14 2211)  ondansetron (ZOFRAN-ODT) 4 MG disintegrating tablet (  Duplicate 05/11/14 2212)  oxyCODONE-acetaminophen (PERCOCET/ROXICET) 5-325 MG per tablet 1 tablet (1 tablet Oral Given 05/11/14 2345)  promethazine (PHENERGAN) tablet 12.5 mg (12.5 mg Oral Given 05/11/14 2345)  penicillin v potassium (VEETID) tablet 500 mg (500 mg Oral Given 05/11/14 2345)   The patient has a negative POC urine pregnancy test. Her hemoglobin is low at 9.8 when it was previously 10. She admits to heavy mesntrual cycles. She was given an rx for iron.  She has a toothache as well, no trismus, drooling, or noticeable abscess- pain medications and penicillin- given referral to dentist  Nausea and vomiting: CMP shows mild hypokalemia (potassium replaced in ED) otherwise unremarkable metabolic panel. Her lipase is negative. Urinalysis shows  signs of dehydration but no infection. Pt denies pain throughout her stay. She has been able to tolerate her medications and passed fluid challenge. The phenergan has helped the nausea resolve.  Pt advised she needs to follow-up with PCP to have labs rechecked.  23 y.o.Prescious L Emmitt's evaluation in the Emergency Department is complete. It has been determined that no acute conditions requiring further emergency intervention are present at this time. The patient/guardian have been advised of the diagnosis and plan. We have discussed signs and symptoms that warrant return to the ED, such as changes or worsening in symptoms.  Vital signs are stable at discharge. Filed Vitals:   05/11/14 2202  BP: 111/60  Pulse: 79  Temp: 98.4 F (36.9 C)  Resp: 18    Patient/guardian has voiced understanding and agreed to follow-up with the PCP or specialist.   I personally performed the services described in this documentation, which was scribed in my presence. The recorded information has been reviewed and is accurate.   Dorthula Matas, PA-C 05/12/14 0110  Vanetta Mulders,  MD 05/12/14 [phone]

## 2014-05-11 NOTE — ED Notes (Addendum)
Patient stated she last vomited a few hours ago.  States she is able to keep fluids down but has not tried to eat

## 2014-05-12 MED ORDER — PENICILLIN V POTASSIUM 500 MG PO TABS: 500.0000 mg | ORAL_TABLET | Freq: Three times a day (TID) | ORAL | Status: DC

## 2014-05-12 MED ORDER — METOCLOPRAMIDE HCL 10 MG PO TABS: 10.0000 mg | ORAL_TABLET | Freq: Four times a day (QID) | ORAL | Status: DC

## 2014-05-12 MED ORDER — FERROUS SULFATE 325 (65 FE) MG PO TABS: 325.0000 mg | ORAL_TABLET | Freq: Every day | ORAL | Status: DC

## 2014-05-12 MED ORDER — TRAMADOL HCL 50 MG PO TABS: 50.0000 mg | ORAL_TABLET | Freq: Four times a day (QID) | ORAL | Status: DC | PRN

## 2014-05-12 NOTE — Discharge Instructions (Signed)
Dental Pain °A tooth ache may be caused by cavities (tooth decay). Cavities expose the nerve of the tooth to air and hot or cold temperatures. It may come from an infection or abscess (also called a boil or furuncle) around your tooth. It is also often caused by dental caries (tooth decay). This causes the pain you are having. °DIAGNOSIS  °Your caregiver can diagnose this problem by exam. °TREATMENT  °· If caused by an infection, it may be treated with medications which kill germs (antibiotics) and pain medications as prescribed by your caregiver. Take medications as directed. °· Only take over-the-counter or prescription medicines for pain, discomfort, or fever as directed by your caregiver. °· Whether the tooth ache today is caused by infection or dental disease, you should see your dentist as soon as possible for further care. °SEEK MEDICAL CARE IF: °The exam and treatment you received today has been provided on an emergency basis only. This is not a substitute for complete medical or dental care. If your problem worsens or new problems (symptoms) appear, and you are unable to meet with your dentist, call or return to this location. °SEEK IMMEDIATE MEDICAL CARE IF:  °· You have a fever. °· You develop redness and swelling of your face, jaw, or neck. °· You are unable to open your mouth. °· You have severe pain uncontrolled by pain medicine. °MAKE SURE YOU:  °· Understand these instructions. °· Will watch your condition. °· Will get help right away if you are not doing well or get worse. °Document Released: 03/01/2005 Document Revised: 05/24/2011 Document Reviewed: 10/18/2007 °ExitCare® Patient Information ©2015 [employer]. This information is not intended to replace advice given to you by your health care provider. Make sure you discuss any questions you have with your health care provider. °Nausea and Vomiting °Nausea is a sick feeling that often comes before throwing up (vomiting). Vomiting is a reflex where  stomach contents come out of your mouth. Vomiting can cause severe loss of body fluids (dehydration). Children and elderly adults can become dehydrated quickly, especially if they also have diarrhea. Nausea and vomiting are symptoms of a condition or disease. It is important to find the cause of your symptoms. °CAUSES  °· Direct irritation of the stomach lining. This irritation can result from increased acid production (gastroesophageal reflux disease), infection, food poisoning, taking certain medicines (such as nonsteroidal anti-inflammatory drugs), alcohol use, or tobacco use. °· Signals from the brain. These signals could be caused by a headache, heat exposure, an inner ear disturbance, increased pressure in the brain from injury, infection, a tumor, or a concussion, pain, emotional stimulus, or metabolic problems. °· An obstruction in the gastrointestinal tract (bowel obstruction). °· Illnesses such as diabetes, hepatitis, gallbladder problems, appendicitis, kidney problems, cancer, sepsis, atypical symptoms of a heart attack, or eating disorders. °· Medical treatments such as chemotherapy and radiation. °· Receiving medicine that makes you sleep (general anesthetic) during surgery. °DIAGNOSIS °Your caregiver may ask for tests to be done if the problems do not improve after a few days. Tests may also be done if symptoms are severe or if the reason for the nausea and vomiting is not clear. Tests may include: °· Urine tests. °· Blood tests. °· Stool tests. °· Cultures (to look for evidence of infection). °· X-rays or other imaging studies. °Test results can help your caregiver make decisions about treatment or the need for additional tests. °TREATMENT °You need to stay well hydrated. Drink frequently but in small amounts. You may wish   to drink water, sports drinks, clear broth, or eat frozen ice pops or gelatin dessert to help stay hydrated. When you eat, eating slowly may help prevent nausea. There are also some  antinausea medicines that may help prevent nausea. °HOME CARE INSTRUCTIONS  °· Take all medicine as directed by your caregiver. °· If you do not have an appetite, do not force yourself to eat. However, you must continue to drink fluids. °· If you have an appetite, eat a normal diet unless your caregiver tells you differently. °¨ Eat a variety of complex carbohydrates (rice, wheat, potatoes, bread), lean meats, yogurt, fruits, and vegetables. °¨ Avoid high-fat foods because they are more difficult to digest. °· Drink enough water and fluids to keep your urine clear or pale yellow. °· If you are dehydrated, ask your caregiver for specific rehydration instructions. Signs of dehydration may include: °¨ Severe thirst. °¨ Dry lips and mouth. °¨ Dizziness. °¨ Dark urine. °¨ Decreasing urine frequency and amount. °¨ Confusion. °¨ Rapid breathing or pulse. °SEEK IMMEDIATE MEDICAL CARE IF:  °· You have blood or brown flecks (like coffee grounds) in your vomit. °· You have black or bloody stools. °· You have a severe headache or stiff neck. °· You are confused. °· You have severe abdominal pain. °· You have chest pain or trouble breathing. °· You do not urinate at least once every 8 hours. °· You develop cold or clammy skin. °· You continue to vomit for longer than 24 to 48 hours. °· You have a fever. °MAKE SURE YOU:  °· Understand these instructions. °· Will watch your condition. °· Will get help right away if you are not doing well or get worse. °Document Released: 03/01/2005 Document Revised: 05/24/2011 Document Reviewed: 07/29/2010 °ExitCare® Patient Information ©2015 [employer] LLC. This information is not intended to replace advice given to you by your health care provider. Make sure you discuss any questions you have with your health care provider. ° °

## 2014-07-21 ENCOUNTER — Encounter (HOSPITAL_COMMUNITY): Payer: Self-pay

## 2014-07-21 ENCOUNTER — Emergency Department (INDEPENDENT_AMBULATORY_CARE_PROVIDER_SITE_OTHER)
Admission: EM | Admit: 2014-07-21 | Discharge: 2014-07-21 | Disposition: A | Discharge status: Home / Self Care | Payer: Self-pay | Source: Home / Self Care | Attending: Family Medicine | Admitting: Family Medicine

## 2014-07-21 DIAGNOSIS — K029 Dental caries, unspecified: Secondary | ICD-10-CM

## 2014-07-21 LAB — POCT PREGNANCY, URINE: Preg Test, Ur: NEGATIVE

## 2014-07-21 MED ORDER — AMOXICILLIN 500 MG PO CAPS: 500.0000 mg | ORAL_CAPSULE | Freq: Three times a day (TID) | ORAL | Status: DC

## 2014-07-21 MED ORDER — DICLOFENAC SODIUM 75 MG PO TBEC: 75.0000 mg | DELAYED_RELEASE_TABLET | Freq: Two times a day (BID) | ORAL | Status: DC | PRN

## 2014-07-21 NOTE — ED Notes (Signed)
Pain in mouth x 3 days

## 2014-07-21 NOTE — Discharge Instructions (Signed)
Thank you for coming in today. °[facility]: ° °[facility] - [phone] (ext [phone]) ° °[address] ° °Please call Dr. Civils office [phone] or cell [phone] °[address], [place]  °Cost for tooth removal $200 includes exam, Xray, and extraction and follow up visit.  °Bring list of current medications with you.  ° °[facility] - [phone] ° °[facility] - [phone] ° °[address] ° °[facility] ° °[address], [place], [place], [address] ° °[phone], Ext. [phone] ° °2nd and 4th Thursday of the month at 6:30am (Simple extractions only - no wisdom teeth or surgery) First come/First serve -First 10 clients served ° °[facility] ([place] residents only) ° °[address], [place], [place], [address] ° °[phone] ° °[facility] ° °[phone] ° °[facility] ° °[phone] ° °[facility] ° °[phone] ° °Please call [facility] at [phone] to get the details to get your tooth pulled.  ° ° ° °Dental Pain °A tooth ache may be caused by cavities (tooth decay). Cavities expose the nerve of the tooth to air and hot or cold temperatures. It may come from an infection or abscess (also called a boil or furuncle) around your tooth. It is also often caused by dental caries (tooth decay). This causes the pain you are having. °DIAGNOSIS  °Your caregiver can diagnose this problem by exam. °TREATMENT  °· If caused by an infection, it may be treated with medications which kill germs (antibiotics) and pain medications as prescribed by your caregiver. Take medications as directed. °· Only take over-the-counter or prescription medicines for pain, discomfort, or fever as directed by your caregiver. °· Whether the tooth ache today is caused by infection or dental disease, you should see your dentist as soon as  possible for further care. °SEEK MEDICAL CARE IF: °The exam and treatment you received today has been provided on an emergency basis only. This is not a substitute for complete medical or dental care. If your problem worsens or new problems (symptoms) appear, and you are unable to meet with your dentist, call or return to this location. °SEEK IMMEDIATE MEDICAL CARE IF:  °· You have a fever. °· You develop redness and swelling of your face, jaw, or neck. °· You are unable to open your mouth. °· You have severe pain uncontrolled by pain medicine. °MAKE SURE YOU:  °· Understand these instructions. °· Will watch your condition. °· Will get help right away if you are not doing well or get worse. °Document Released: 03/01/2005 Document Revised: 05/24/2011 Document Reviewed: 10/18/2007 °ExitCare® Patient Information ©2015 [employer]. This information is not intended to replace advice given to you by your health care provider. Make sure you discuss any questions you have with your health care provider. ° °

## 2014-07-21 NOTE — ED Provider Notes (Signed)
Tonya Walls is a 24 y.o. female who presents to Urgent Care today for dental pain. Patient has 3-4 days of worsening left-sided mandible dental pain radiating to the left jaw and face. No fevers or chills nausea vomiting or diarrhea. Patient has not contacted the dentist yet because she does not have a dentist nor is she have dental insurance. The patient additionally would like a pregnancy test.  She notes that she uses condoms intermittently and last had a period on April 25 but the menstrual period was very brief and not typical.   History reviewed. No pertinent past medical history. History reviewed. No pertinent past surgical history. History  Substance Use Topics  . Smoking status: Former Smoker    Quit date: 11/13/2012  . Smokeless tobacco: Never Used  . Alcohol Use: No   ROS as above Medications: No current facility-administered medications for this encounter.   Current Outpatient Prescriptions  Medication Sig Dispense Refill  . amoxicillin (AMOXIL) 500 MG capsule Take 1 capsule (500 mg total) by mouth 3 (three) times daily. 21 capsule 0  . diclofenac (VOLTAREN) 75 MG EC tablet Take 1 tablet (75 mg total) by mouth 2 (two) times daily as needed. 30 tablet 0  . ferrous sulfate 325 (65 FE) MG tablet Take 1 tablet (325 mg total) by mouth daily. 30 tablet 0  . ibuprofen (ADVIL,MOTRIN) 200 MG tablet Take 600 mg by mouth every 6 (six) hours as needed (pain).    Marland Kitchen. omeprazole (PRILOSEC) 20 MG capsule Take 1 capsule (20 mg total) by mouth daily. 30 capsule 3  . ondansetron (ZOFRAN) 4 MG tablet Take 1 tablet (4 mg total) by mouth every 6 (six) hours. 12 tablet 0  . traMADol (ULTRAM) 50 MG tablet Take 1 tablet (50 mg total) by mouth every 6 (six) hours as needed. 15 tablet 0  . [DISCONTINUED] metoCLOPramide (REGLAN) 10 MG tablet Take 1 tablet (10 mg total) by mouth every 6 (six) hours. 30 tablet 0   No Known Allergies   Exam:  BP 144/91 mmHg  Pulse 94  Temp(Src) 98.1 F (36.7 C)  (Oral)  Resp 18  SpO2 100% Gen: Well NAD HEENT: EOMI,  MMM visible dental caries tooth #18 tender to touch with gumline erythema without visible abscess. Lungs: Normal work of breathing. CTABL Heart: RRR no MRG Abd: NABS, Soft. Nondistended, Nontender Exts: Brisk capillary refill, warm and well perfused.   Results for orders placed or performed during the hospital encounter of 07/21/14 (from the past 24 hour(s))  Pregnancy, urine POC     Status: None   Collection Time: 07/21/14  4:22 PM  Result Value Ref Range   Preg Test, Ur NEGATIVE NEGATIVE   No results found.  Assessment and Plan: 24 y.o. female with dental pain due to dental caries. Treat with amoxicillin and diclofenac. Follow-up with dentist.  Discussed warning signs or symptoms. Please see discharge instructions. Patient expresses understanding.     Rodolph BongEvan S [place] Ndiaye, MD 07/21/14 1630

## 2014-08-14 ENCOUNTER — Emergency Department (HOSPITAL_COMMUNITY)
Admission: EM | Admit: 2014-08-14 | Discharge: 2014-08-14 | Disposition: A | Discharge status: Home / Self Care | Payer: Self-pay | Attending: Emergency Medicine | Admitting: Emergency Medicine

## 2014-08-14 DIAGNOSIS — Z79899 Other long term (current) drug therapy: Secondary | ICD-10-CM | POA: Insufficient documentation

## 2014-08-14 DIAGNOSIS — K0889 Other specified disorders of teeth and supporting structures: Secondary | ICD-10-CM

## 2014-08-14 DIAGNOSIS — Z87891 Personal history of nicotine dependence: Secondary | ICD-10-CM | POA: Insufficient documentation

## 2014-08-14 DIAGNOSIS — K0381 Cracked tooth: Secondary | ICD-10-CM | POA: Insufficient documentation

## 2014-08-14 DIAGNOSIS — K088 Other specified disorders of teeth and supporting structures: Secondary | ICD-10-CM | POA: Insufficient documentation

## 2014-08-14 DIAGNOSIS — Z792 Long term (current) use of antibiotics: Secondary | ICD-10-CM | POA: Insufficient documentation

## 2014-08-14 NOTE — ED Notes (Signed)
R lower dental pain.   Patient changed story several times.   Patient first states L lower dental pain.  Patient then stated R lower dental pain but couldn't pinpoint area.   Patient stated had not been to dentist then stated she had.   Patient stated she is here for pain medicine.

## 2014-08-14 NOTE — Discharge Instructions (Signed)
You may take ibuprofen or Tylenol for your pain. Follow-up with the dentist, resources attached.  Dental Pain A tooth ache may be caused by cavities (tooth decay). Cavities expose the nerve of the tooth to air and hot or cold temperatures. It may come from an infection or abscess (also called a boil or furuncle) around your tooth. It is also often caused by dental caries (tooth decay). This causes the pain you are having. DIAGNOSIS  Your caregiver can diagnose this problem by exam. TREATMENT   If caused by an infection, it may be treated with medications which kill germs (antibiotics) and pain medications as prescribed by your caregiver. Take medications as directed.  Only take over-the-counter or prescription medicines for pain, discomfort, or fever as directed by your caregiver.  Whether the tooth ache today is caused by infection or dental disease, you should see your dentist as soon as possible for further care. SEEK MEDICAL CARE IF: The exam and treatment you received today has been provided on an emergency basis only. This is not a substitute for complete medical or dental care. If your problem worsens or new problems (symptoms) appear, and you are unable to meet with your dentist, call or return to this location. SEEK IMMEDIATE MEDICAL CARE IF:   You have a fever.  You develop redness and swelling of your face, jaw, or neck.  You are unable to open your mouth.  You have severe pain uncontrolled by pain medicine. MAKE SURE YOU:   Understand these instructions.  Will watch your condition.  Will get help right away if you are not doing well or get worse. Document Released: 03/01/2005 Document Revised: 05/24/2011 Document Reviewed: 10/18/2007 [facility] Patient Information 2015 [place]Care, [place]. This information is not intended to replace advice given to you by your health care provider. Make sure you discuss any questions you have with your health care provider.

## 2014-08-14 NOTE — ED Provider Notes (Signed)
CSN: [id number]     Arrival date & time 08/14/14  1146 History  This chart was scribed for non-physician practitioner, Celene Skeenobyn Marylou Wages, working with Zadie Rhineonald Wickline, MD by Richarda Overlieichard Holland, ED Scribe. This patient was seen in room TR07C/TR07C and the patient's care was started at 1:20 PM.    Chief Complaint  Patient presents with  . Dental Pain   The history is provided by the patient. No language interpreter was used.   HPI Comments: Tonya Walls is a 24 y.o. female who presents to the Emergency Department complaining of right lower dental pain for the last several days. Pt states that a tooth broke off in this area a few days ago. She rates her pain as a 10/10, worse with chewing. Pt states that she has taken aleve and ibuprofen without relief. She says that she was seen at an UC recently for left lower dental pain. Pt requests a work note for yesterday since she left early. She denies fever.   No past medical history on file. No past surgical history on file. No family history on file. History  Substance Use Topics  . Smoking status: Former Smoker    Quit date: 11/13/2012  . Smokeless tobacco: Never Used  . Alcohol Use: No   OB History    No data available     Review of Systems  Constitutional: Negative for fever.  HENT: Positive for dental problem.     Allergies  Review of patient's allergies indicates no known allergies.  Home Medications   Prior to Admission medications   Medication Sig Start Date End Date Taking? Authorizing Provider  amoxicillin (AMOXIL) 500 MG capsule Take 1 capsule (500 mg total) by mouth 3 (three) times daily. 07/21/14   Rodolph BongEvan S Corey, MD  diclofenac (VOLTAREN) 75 MG EC tablet Take 1 tablet (75 mg total) by mouth 2 (two) times daily as needed. 07/21/14   Rodolph BongEvan S Corey, MD  ferrous sulfate 325 (65 FE) MG tablet Take 1 tablet (325 mg total) by mouth daily. 05/12/14   Tiffany Neva SeatGreene, PA-C  ibuprofen (ADVIL,MOTRIN) 200 MG tablet Take 600 mg by mouth every 6 (six)  hours as needed (pain).    Historical Provider, MD  omeprazole (PRILOSEC) 20 MG capsule Take 1 capsule (20 mg total) by mouth daily. 12/07/13   Denton Brickiana M Truong, MD  ondansetron (ZOFRAN) 4 MG tablet Take 1 tablet (4 mg total) by mouth every 6 (six) hours. 03/15/14   Lula OlszewskiMike Goebel, MD  traMADol (ULTRAM) 50 MG tablet Take 1 tablet (50 mg total) by mouth every 6 (six) hours as needed. 05/12/14   Tiffany Neva SeatGreene, PA-C   BP 119/68 mmHg  Pulse 88  Temp(Src) 97.8 F (36.6 C) (Oral)  Resp 16  Ht 5\' 3"  (1.6 m)  Wt 250 lb (113.399 kg)  BMI 44.30 kg/m2  SpO2 100% Physical Exam  Constitutional: She is oriented to person, place, and time. She appears well-developed and well-nourished. No distress.  HENT:  Head: Normocephalic and atraumatic.  Mouth/Throat: Oropharynx is clear and moist. No oral lesions. No trismus in the jaw. No dental abscesses or dental caries.    Eyes: Conjunctivae and EOM are normal.  Neck: Normal range of motion. Neck supple.  Cardiovascular: Normal rate, regular rhythm and normal heart sounds.   Pulmonary/Chest: Effort normal and breath sounds normal. No respiratory distress.  Musculoskeletal: Normal range of motion. She exhibits no edema.  Neurological: She is alert and oriented to person, place, and time. No sensory deficit.  Skin: Skin is warm and dry.  Psychiatric: She has a normal mood and affect. Her behavior is normal.  Nursing note and vitals reviewed.   ED Course  Procedures   DIAGNOSTIC STUDIES: Oxygen Saturation is 99% on RA, normal by my interpretation.    COORDINATION OF CARE: 1:23 PM Discussed treatment plan with pt at bedside and pt agreed to plan.   Labs Review Labs Reviewed - No data to display  Imaging Review No results found.   EKG Interpretation None      MDM   Final diagnoses:  Pain, dental   Dental pain without any signs of infection, abscess or Ludwig angina. Requesting pain medication other than over-the-counter medication. I advised  her that she will need to follow-up with the dentist for further care and can continue over-the-counter medication. Stable for discharge. Return precautions given. Patient states understanding of treatment care plan and is agreeable.  I personally performed the services described in this documentation, which was scribed in my presence. The recorded information has been reviewed and is accurate.     Kathrynn Speed, PA-C 08/14/14 1331  Zadie Rhine, MD 08/14/14 1425

## 2014-08-14 NOTE — ED Notes (Signed)
Pt reports dental pain and known cavity on left lower molar.

## 2014-08-22 ENCOUNTER — Encounter (HOSPITAL_COMMUNITY): Payer: Self-pay | Admitting: Cardiology

## 2014-08-22 ENCOUNTER — Emergency Department (HOSPITAL_COMMUNITY)
Admission: EM | Admit: 2014-08-22 | Discharge: 2014-08-22 | Disposition: A | Discharge status: Home / Self Care | Payer: Self-pay | Attending: Emergency Medicine | Admitting: Emergency Medicine

## 2014-08-22 DIAGNOSIS — H9202 Otalgia, left ear: Secondary | ICD-10-CM | POA: Insufficient documentation

## 2014-08-22 DIAGNOSIS — K047 Periapical abscess without sinus: Secondary | ICD-10-CM | POA: Insufficient documentation

## 2014-08-22 DIAGNOSIS — J029 Acute pharyngitis, unspecified: Secondary | ICD-10-CM | POA: Insufficient documentation

## 2014-08-22 DIAGNOSIS — Z79899 Other long term (current) drug therapy: Secondary | ICD-10-CM | POA: Insufficient documentation

## 2014-08-22 DIAGNOSIS — Z87891 Personal history of nicotine dependence: Secondary | ICD-10-CM | POA: Insufficient documentation

## 2014-08-22 MED ORDER — PENICILLIN V POTASSIUM 500 MG PO TABS: 500.0000 mg | ORAL_TABLET | Freq: Four times a day (QID) | ORAL | Status: DC

## 2014-08-22 MED ORDER — ACETAMINOPHEN 325 MG PO TABS
650.0000 mg | ORAL_TABLET | Freq: Once | ORAL | Status: AC
  Administered 2014-08-22: 650 mg via ORAL
  Filled 2014-08-22: qty 2

## 2014-08-22 MED ORDER — NAPROXEN 500 MG PO TABS: 500.0000 mg | ORAL_TABLET | Freq: Two times a day (BID) | ORAL | Status: DC

## 2014-08-22 NOTE — ED Provider Notes (Signed)
CSN: [id number]     Arrival date & time 08/22/14  1352 History  This chart was scribed for non-physician practitioner Will Marijo File, PA-C working with Gwyneth Sprout, MD by Murriel Hopper, ED Scribe. This patient was seen in room TR03C/TR03C and the patient's care was started at 2:57 PM.    Chief Complaint  Patient presents with  . Facial Swelling      The history is provided by the patient. No language interpreter was used.     HPI Comments: Tonya Walls is a 24 y.o. female who presents to the Emergency Department complaining of left-sided facial swelling with associated sore throat, left dental pain and left ear pain that began yesterday. Pt describes her facial swelling pain as a 5/10. Pt states that she has had a cavity in a left-sided mandibular molar for a few weeks but has not been to see a dentist yet. She was given information about a dentist at last visit but did not go.  Pt states that her dental pain began a few weeks ago, and currently she has none and just the side of her face by her lower teeth hurt. Pt also states that she had a sore throat that began a few days ago. Pt denies trouble swallowing, fever, chills, nausea, vomiting, or purulent drainage from area in mouth, numbness, tingling, weakness, neck pain, neck stiffness, dizziness, lightheadedness, headache or rashes. She denies recent antibiotic use.  History reviewed. No pertinent past medical history. History reviewed. No pertinent past surgical history. History reviewed. No pertinent family history. History  Substance Use Topics  . Smoking status: Former Smoker    Quit date: 11/13/2012  . Smokeless tobacco: Never Used  . Alcohol Use: No   OB History    No data available     Review of Systems  Constitutional: Negative for fever and chills.  HENT: Positive for dental problem, ear pain, facial swelling and sore throat. Negative for ear discharge and trouble swallowing.   Eyes: Negative for visual disturbance.   Respiratory: Negative for cough and shortness of breath.   Gastrointestinal: Negative for nausea, vomiting and abdominal pain.  Skin: Negative for rash.  Neurological: Negative for dizziness, weakness, light-headedness, numbness and headaches.      Allergies  Review of patient's allergies indicates no known allergies.  Home Medications   Prior to Admission medications   Medication Sig Start Date End Date Taking? Authorizing Provider  amoxicillin (AMOXIL) 500 MG capsule Take 1 capsule (500 mg total) by mouth 3 (three) times daily. 07/21/14   Rodolph Bong, MD  diclofenac (VOLTAREN) 75 MG EC tablet Take 1 tablet (75 mg total) by mouth 2 (two) times daily as needed. 07/21/14   Rodolph Bong, MD  ferrous sulfate 325 (65 FE) MG tablet Take 1 tablet (325 mg total) by mouth daily. 05/12/14   Tiffany Neva Seat, PA-C  ibuprofen (ADVIL,MOTRIN) 200 MG tablet Take 600 mg by mouth every 6 (six) hours as needed (pain).    Historical Provider, MD  naproxen (NAPROSYN) 500 MG tablet Take 1 tablet (500 mg total) by mouth 2 (two) times daily with a meal. 08/22/14   Everlene Farrier, PA-C  omeprazole (PRILOSEC) 20 MG capsule Take 1 capsule (20 mg total) by mouth daily. 12/07/13   Denton Brick, MD  ondansetron (ZOFRAN) 4 MG tablet Take 1 tablet (4 mg total) by mouth every 6 (six) hours. 03/15/14   Lula Olszewski, MD  penicillin v potassium (VEETID) 500 MG tablet Take 1 tablet (500  mg total) by mouth 4 (four) times daily. 08/22/14   Everlene Farrier, PA-C  traMADol (ULTRAM) 50 MG tablet Take 1 tablet (50 mg total) by mouth every 6 (six) hours as needed. 05/12/14   Tiffany Neva Seat, PA-C   BP 114/68 mmHg  Pulse 76  Temp(Src) 98 F (36.7 C) (Oral)  Resp 18  Ht 5\' 3"  (1.6 m)  Wt 244 lb (110.678 kg)  BMI 43.23 kg/m2  SpO2 99% Physical Exam  Constitutional: She is oriented to person, place, and time. She appears well-developed and well-nourished. No distress.  Nontoxic appearing.  HENT:  Head: Normocephalic and atraumatic.   Right Ear: External ear normal.  Left Ear: External ear normal.  Mouth/Throat: Oropharynx is clear and moist. No oropharyngeal exudate.  Uvula midline without edema. Soft palate rises symmetrically. Generally poor dentition and cracked left lower molar. Area of induration and mild edema to her left lower cheek around her cracked lower molar. No fluctuance or drainage. No cervical lymphadenopathy. No overlying erythema. No loose teeth. No tonsillar hypertrophy or exudates.  Bilateral tympanic membranes are pearly-gray without erythema or loss of landmarks.   Eyes: Conjunctivae and EOM are normal. Pupils are equal, round, and reactive to light. Right eye exhibits no discharge. Left eye exhibits no discharge.  Neck: Normal range of motion. Neck supple. No JVD present. No tracheal deviation present.  Cardiovascular: Normal rate, regular rhythm, normal heart sounds and intact distal pulses.   Pulmonary/Chest: Effort normal and breath sounds normal. No respiratory distress. She has no wheezes. She has no rales.  Abdominal: Soft. There is no tenderness.  Lymphadenopathy:    She has no cervical adenopathy.  Neurological: She is alert and oriented to person, place, and time. No cranial nerve deficit. Coordination normal.  Cranial nerves are intact bilaterally. EOMs intact bilaterally. Sensation is intact in her bilateral face.  Skin: Skin is warm and dry. No rash noted. She is not diaphoretic. No erythema. No pallor.  Psychiatric: She has a normal mood and affect. Her behavior is normal.  Nursing note and vitals reviewed.   ED Course  Procedures (including critical care time)  DIAGNOSTIC STUDIES: Oxygen Saturation is 99% on room air, normal by my interpretation.    COORDINATION OF CARE: 3:05 PM Discussed treatment plan with pt at bedside and pt agreed to plan.   Labs Review Labs Reviewed - No data to display  Imaging Review No results found.   EKG Interpretation None      Filed  Vitals:   08/22/14 1427 08/22/14 1518 08/22/14 1525  BP: 119/67 113/70 114/68  Pulse: 86 76 76  Temp: 98.2 F (36.8 C) 98.2 F (36.8 C) 98 F (36.7 C)  TempSrc:  Oral Oral  Resp: 18 16 18   Height: 5\' 3"  (1.6 m)    Weight: 244 lb (110.678 kg)    SpO2: 99% 100% 99%     MDM   Meds given in ED:  Medications  acetaminophen (TYLENOL) tablet 650 mg (650 mg Oral Given 08/22/14 1519)    Discharge Medication List as of 08/22/2014  3:15 PM    START taking these medications   Details  naproxen (NAPROSYN) 500 MG tablet Take 1 tablet (500 mg total) by mouth 2 (two) times daily with a meal., Starting 08/22/2014, Until Discontinued, Print    penicillin v potassium (VEETID) 500 MG tablet Take 1 tablet (500 mg total) by mouth 4 (four) times daily., Starting 08/22/2014, Until Discontinued, Print        Final diagnoses:  Dental abscess   This is a 24 year old female who presents to the emergency department complaining of swelling to the left side of her cheek since yesterday. Patient reports she is also having left sided lower molar dental pain but now she is just having pain in her cheek. On exam patient is afebrile and nontoxic appearing. She does have a mild amount of edema to her left lower cheek near where there is a cracked lower molar. There is an area of induration however no fluctuance and no gross abscess that would be able to be drained at this time. The patient's uvula is midline without edema. No tonsillar hypertrophy or exudates. Her soft palate rises symmetrically. No concern for Ludwig angina. Patient was recently seen for dental pain but did not follow-up with a dentist. She denies recent antibiotic use. She has taken nothing for treatment today. Patient provided with Tylenol in the ED. We'll discharge with prescriptions for penicillin and naproxen for pain control. I advised the patient to call make a follow-up appointment with dentist Dr. Mayford Knife. I advised her to take her discharge  instructions to this appointment. I advised the patient to follow-up with their primary care provider this week. I advised the patient to return to the emergency department with new or worsening symptoms or new concerns. The patient verbalized understanding and agreement with plan.    I personally performed the services described in this documentation, which was scribed in my presence. The recorded information has been reviewed and is accurate.    Everlene Farrier, PA-C 08/22/14 1654  Gwyneth Sprout, MD 08/23/14 2214

## 2014-08-22 NOTE — ED Notes (Signed)
Pt reports a normal appetite . Pain is a 5/10

## 2014-08-22 NOTE — ED Notes (Signed)
Pt reports she has a cavity on the left side of her mouth. Reports she noticed swelling to the same side of her face last night. Denies any pain. Skin warm and dry.

## 2014-08-22 NOTE — Discharge Instructions (Signed)

## 2015-03-16 NOTE — L&D Delivery Note (Signed)
   Delivery Note  25 y/o G1 now P1 at 40 and 5 days with SOL today.   At 7:55 PM a viable female was delivered via Vaginal, Spontaneous Delivery (Presentation:cephalic ; OA ).  APGAR: 8, 9; weight pending .   Placenta status: delivere with gentle traction. Cord: three vessel  with the following complications: none.  Anesthesia:  epidrual Episiotomy:  none Lacerations:  2nd degree midline perineal Suture Repair: 3.0 vicryl Est. Blood Loss (mL):  150  Mom to postpartum.  Baby to Couplet care / Skin to Skin.  Ernestina Pennaicholas Schenk 12/15/2015, 8:40 PM

## 2015-04-04 ENCOUNTER — Emergency Department (HOSPITAL_COMMUNITY): Payer: Medicaid Other

## 2015-04-04 ENCOUNTER — Encounter (HOSPITAL_COMMUNITY): Payer: Self-pay | Admitting: Emergency Medicine

## 2015-04-04 ENCOUNTER — Emergency Department (HOSPITAL_COMMUNITY)
Admission: EM | Admit: 2015-04-04 | Discharge: 2015-04-04 | Disposition: A | Discharge status: Home / Self Care | Payer: Medicaid Other | Attending: Emergency Medicine | Admitting: Emergency Medicine

## 2015-04-04 DIAGNOSIS — Z87891 Personal history of nicotine dependence: Secondary | ICD-10-CM | POA: Diagnosis not present

## 2015-04-04 DIAGNOSIS — D649 Anemia, unspecified: Secondary | ICD-10-CM | POA: Diagnosis not present

## 2015-04-04 DIAGNOSIS — Z331 Pregnant state, incidental: Secondary | ICD-10-CM | POA: Insufficient documentation

## 2015-04-04 DIAGNOSIS — R1030 Lower abdominal pain, unspecified: Secondary | ICD-10-CM | POA: Diagnosis not present

## 2015-04-04 DIAGNOSIS — R110 Nausea: Secondary | ICD-10-CM | POA: Diagnosis not present

## 2015-04-04 DIAGNOSIS — Z3490 Encounter for supervision of normal pregnancy, unspecified, unspecified trimester: Secondary | ICD-10-CM

## 2015-04-04 LAB — COMPREHENSIVE METABOLIC PANEL
ALBUMIN: 3.5 g/dL (ref 3.5–5.0)
ALK PHOS: 48 U/L (ref 38–126)
ALT: 15 U/L (ref 14–54)
ANION GAP: 9 (ref 5–15)
AST: 16 U/L (ref 15–41)
BILIRUBIN TOTAL: 0.7 mg/dL (ref 0.3–1.2)
BUN: 15 mg/dL (ref 6–20)
CALCIUM: 9.2 mg/dL (ref 8.9–10.3)
CO2: 22 mmol/L (ref 22–32)
Chloride: 108 mmol/L (ref 101–111)
Creatinine, Ser: 0.57 mg/dL (ref 0.44–1.00)
GFR calc Af Amer: >60 mL/min (ref >60)
GLUCOSE: 110 mg/dL — AB (ref 65–99)
Potassium: 3.7 mmol/L (ref 3.5–5.1)
Sodium: 139 mmol/L (ref 135–145)
TOTAL PROTEIN: 7.4 g/dL (ref 6.5–8.1)

## 2015-04-04 LAB — URINALYSIS, ROUTINE W REFLEX MICROSCOPIC
Bilirubin Urine: NEGATIVE
GLUCOSE, UA: NEGATIVE mg/dL
HGB URINE DIPSTICK: NEGATIVE
KETONES UR: 15 mg/dL — AB
NITRITE: NEGATIVE
PH: 6.5 (ref 5.0–8.0)
PROTEIN: NEGATIVE mg/dL
Specific Gravity, Urine: 1.035 — ABNORMAL HIGH (ref 1.005–1.030)

## 2015-04-04 LAB — CBC
HCT: 32.4 % — ABNORMAL LOW (ref 36.0–46.0)
HEMOGLOBIN: 10.4 g/dL — AB (ref 12.0–15.0)
MCH: 26.8 pg (ref 26.0–34.0)
MCHC: 32.1 g/dL (ref 30.0–36.0)
MCV: 83.5 fL (ref 78.0–100.0)
Platelets: 390 10*3/uL (ref 150–400)
RBC: 3.88 MIL/uL (ref 3.87–5.11)
RDW: 16.1 % — AB (ref 11.5–15.5)
WBC: 5 10*3/uL (ref 4.0–10.5)

## 2015-04-04 LAB — WET PREP, GENITAL
Clue Cells Wet Prep HPF POC: NONE SEEN
SPERM: NONE SEEN
TRICH WET PREP: NONE SEEN
WBC WET PREP: NONE SEEN
Yeast Wet Prep HPF POC: NONE SEEN

## 2015-04-04 LAB — URINE MICROSCOPIC-ADD ON

## 2015-04-04 LAB — LIPASE, BLOOD: Lipase: 22 U/L (ref 11–51)

## 2015-04-04 LAB — I-STAT BETA HCG BLOOD, ED (MC, WL, AP ONLY): I-stat hCG, quantitative: 929.5 m[IU]/mL — ABNORMAL HIGH (ref <5)

## 2015-04-04 MED ORDER — PRENATAL COMPLETE 14-0.4 MG PO TABS: 2.0000 | ORAL_TABLET | Freq: Every day | ORAL | Status: DC

## 2015-04-04 NOTE — Discharge Instructions (Signed)
1. Medications: Prenatal Vitamins, usual home medications 2. Treatment: rest, drink plenty of fluids,  3. Follow Up: Please followup with the [facility] or women's outpatient clinic in 48 hours (Sunday afternoon) for repeat HCG testing; return sooner if you develop worsening pain, vaginal bleeding, fevers or other concerns    Eating Plan for Pregnant Women While you are pregnant, your body will require additional nutrition to help support your growing baby. It is recommended that you consume:  150 additional calories each day during your first trimester.  300 additional calories each day during your second trimester.  300 additional calories each day during your third trimester. Eating a healthy, well-balanced diet is very important for your health and for your baby's health. You also have a higher need for some vitamins and minerals, such as folic acid, calcium, iron, and vitamin D. WHAT DO I NEED TO KNOW ABOUT EATING DURING PREGNANCY?  Do not try to lose weight or go on a diet during pregnancy.  Choose healthy, nutritious foods. Choose  of a sandwich with a glass of milk instead of a candy bar or a high-calorie sugar-sweetened beverage.  Limit your overall intake of foods that have "empty calories." These are foods that have little nutritional value, such as sweets, desserts, candies, sugar-sweetened beverages, and fried foods.  Eat a variety of foods, especially fruits and vegetables.  Take a prenatal vitamin to help meet the additional needs during pregnancy, specifically for folic acid, iron, calcium, and vitamin D.  Remember to stay active. Ask your health care provider for exercise recommendations that are specific to you.  Practice good food safety and cleanliness, such as washing your hands before you eat and after you prepare raw meat. This helps to prevent foodborne illnesses, such as listeriosis, that can be very dangerous for your baby. Ask your health care provider for more  information about listeriosis. WHAT DOES 150 EXTRA CALORIES LOOK LIKE? Healthy options for an additional 150 calories each day could be any of the following:  Plain low-fat yogurt (6-8 oz) with  cup of berries.  1 apple with 2 teaspoons of peanut butter.  Cut-up vegetables with  cup of hummus.  Low-fat chocolate milk (8 oz or 1 cup).  1 string cheese with 1 medium orange.   of a peanut butter and jelly sandwich on whole-wheat bread (1 tsp of peanut butter). For 300 calories, you could eat two of those healthy options each day.  WHAT IS A HEALTHY AMOUNT OF WEIGHT TO GAIN? The recommended amount of weight for you to gain is based on your pre-pregnancy BMI. If your pre-pregnancy BMI was:  Less than 18 (underweight), you should gain 28-40 lb.  18-24.9 (normal), you should gain 25-35 lb.  25-29.9 (overweight), you should gain 15-25 lb.  Greater than 30 (obese), you should gain 11-20 lb. WHAT IF I AM HAVING TWINS OR MULTIPLES? Generally, pregnant women who will be having twins or multiples may need to increase their daily calories by 300-600 calories each day. The recommended range for total weight gain is 25-54 lb, depending on your pre-pregnancy BMI. Talk with your health care provider for specific guidance about additional nutritional needs, weight gain, and exercise during your pregnancy. WHAT FOODS CAN I EAT? Grains Any grains. Try to choose whole grains, such as whole-wheat bread, oatmeal, or brown rice. Vegetables Any vegetables. Try to eat a variety of colors and types of vegetables to get a full range of vitamins and minerals. Remember to wash your vegetables well  before eating. Fruits Any fruits. Try to eat a variety of colors and types of fruit to get a full range of vitamins and minerals. Remember to wash your fruits well before eating. Meats and Other Protein Sources Lean meats, including chicken, [place], fish, and lean cuts of beef, veal, or pork. Make sure that all  meats are cooked to "well done." Tofu. Tempeh. Beans. Eggs. Peanut butter and other nut butters. Seafood, such as shrimp, crab, and lobster. If you choose fish, select types that are higher in omega-3 fatty acids, including salmon, herring, mussels, trout, sardines, and pollock. Make sure that all meats are cooked to food-safe temperatures. Dairy Pasteurized milk and milk alternatives. Pasteurized yogurt and pasteurized cheese. Cottage cheese. Sour cream. Beverages Water. Juices that contain 100% fruit juice or vegetable juice. Caffeine-free teas and decaffeinated coffee. Drinks that contain caffeine are okay to drink, but it is better to avoid caffeine. Keep your total caffeine intake to less than 200 mg each day (12 oz of coffee, tea, or soda) or as directed by your health care provider. Condiments Any pasteurized condiments. Sweets and Desserts Any sweets and desserts. Fats and Oils Any fats and oils. The items listed above may not be a complete list of recommended foods or beverages. Contact your dietitian for more options. WHAT FOODS ARE NOT RECOMMENDED? Vegetables Unpasteurized (raw) vegetable juices. Fruits Unpasteurized (raw) fruit juices. Meats and Other Protein Sources Cured meats that have nitrates, such as bacon, salami, and hotdogs. Luncheon meats, bologna, or other deli meats (unless they are reheated until they are steaming hot). Refrigerated pate, meat spreads from a meat counter, smoked seafood that is found in the refrigerated section of a store. Raw fish, such as sushi or sashimi. High mercury content fish, such as tilefish, shark, swordfish, and king mackerel. Raw meats, such as tuna or beef tartare. Undercooked meats and poultry. Make sure that all meats are cooked to food-safe temperatures. Dairy Unpasteurized (raw) milk and any foods that have raw milk in them. Soft cheeses, such as feta, queso blanco, queso fresco, Brie, Camembert cheeses, blue-veined cheeses, and Panela  cheese (unless it is made with pasteurized milk, which must be stated on the label). Beverages Alcohol. Sugar-sweetened beverages, such as sodas, teas, or energy drinks. Condiments Homemade fermented foods and drinks, such as pickles, sauerkraut, or kombucha drinks. (Store-bought pasteurized versions of these are okay.) Other Salads that are made in the store, such as ham salad, chicken salad, egg salad, tuna salad, and seafood salad. The items listed above may not be a complete list of foods and beverages to avoid. Contact your dietitian for more information.   This information is not intended to replace advice given to you by your health care provider. Make sure you discuss any questions you have with your health care provider.   Document Released: 12/14/2013 Document Reviewed: 12/14/2013 Elsevier Interactive Patient Education [employer]   First Trimester of Pregnancy The first trimester of pregnancy is from week 1 until the end of week 12 (months 1 through 3). During this time, your baby will begin to develop inside you. At 6-8 weeks, the eyes and face are formed, and the heartbeat can be seen on ultrasound. At the end of 12 weeks, all the baby's organs are formed. Prenatal care is all the medical care you receive before the birth of your baby. Make sure you get good prenatal care and follow all of your doctor's instructions. HOME CARE  Medicines  Take medicine only as told  by your doctor. Some medicines are safe and some are not during pregnancy.  Take your prenatal vitamins as told by your doctor.  Take medicine that helps you poop (stool softener) as needed if your doctor says it is okay. Diet  Eat regular, healthy meals.  Your doctor will tell you the amount of weight gain that is right for you.  Avoid raw meat and uncooked cheese.  If you feel sick to your stomach (nauseous) or throw up (vomit):  Eat 4 or 5 small meals a day instead of 3 large meals.  Try eating  a few soda crackers.  Drink liquids between meals instead of during meals.  If you have a hard time pooping (constipation):  Eat high-fiber foods like fresh vegetables, fruit, and whole grains.  Drink enough fluids to keep your pee (urine) clear or pale yellow. Activity and Exercise  Exercise only as told by your doctor. Stop exercising if you have cramps or pain in your lower belly (abdomen) or low back.  Try to avoid standing for long periods of time. Move your legs often if you must stand in one place for a long time.  Avoid heavy lifting.  Wear low-heeled shoes. Sit and stand up straight.  You can have sex unless your doctor tells you not to. Relief of Pain or Discomfort  Wear a good support bra if your breasts are sore.  Take warm water baths (sitz baths) to soothe pain or discomfort caused by hemorrhoids. Use hemorrhoid cream if your doctor says it is okay.  Rest with your legs raised if you have leg cramps or low back pain.  Wear support hose if you have puffy, bulging veins (varicose veins) in your legs. Raise (elevate) your feet for 15 minutes, 3-4 times a day. Limit salt in your diet. Prenatal Care  Schedule your prenatal visits by the twelfth week of pregnancy.  Write down your questions. Take them to your prenatal visits.  Keep all your prenatal visits as told by your doctor. Safety  Wear your seat belt at all times when driving.  Make a list of emergency phone numbers. The list should include numbers for family, friends, the hospital, and police and fire departments. General Tips  Ask your doctor for a referral to a local prenatal class. Begin classes no later than at the start of month 6 of your pregnancy.  Ask for help if you need counseling or help with nutrition. Your doctor can give you advice or tell you where to go for help.  Do not use hot tubs, steam rooms, or saunas.  Do not douche or use tampons or scented sanitary pads.  Do not cross your  legs for long periods of time.  Avoid litter boxes and soil used by cats.  Avoid all smoking, herbs, and alcohol. Avoid drugs not approved by your doctor.  Do not use any tobacco products, including cigarettes, chewing tobacco, and electronic cigarettes. If you need help quitting, ask your doctor. You may get counseling or other support to help you quit.  Visit your dentist. At home, brush your teeth with a soft toothbrush. Be gentle when you floss. GET HELP IF:  You are dizzy.  You have mild cramps or pressure in your lower belly.  You have a nagging pain in your belly area.  You continue to feel sick to your stomach, throw up, or have watery poop (diarrhea).  You have a bad smelling fluid coming from your vagina.  You have pain  with peeing (urination).  You have increased puffiness (swelling) in your face, hands, legs, or ankles. GET HELP RIGHT AWAY IF:   You have a fever.  You are leaking fluid from your vagina.  You have spotting or bleeding from your vagina.  You have very bad belly cramping or pain.  You gain or lose weight rapidly.  You throw up blood. It may look like coffee grounds.  You are around people who have [place] measles, fifth disease, or chickenpox.  You have a very bad headache.  You have shortness of breath.  You have any kind of trauma, such as from a fall or a car accident.   This information is not intended to replace advice given to you by your health care provider. Make sure you discuss any questions you have with your health care provider.   Document Released: 08/18/2007 Document Revised: 03/22/2014 Document Reviewed: 01/09/2013 Elsevier Interactive Patient Education 2016 [employer].   Emergency Department Resource Guide 1) Find a Doctor and Pay Out of Pocket Although you won't have to find out who is covered by your insurance plan, it is a good idea to ask around and get recommendations. You will then need to call the office and see  if the doctor you have chosen will accept you as a new patient and what types of options they offer for patients who are self-pay. Some doctors offer discounts or will set up payment plans for their patients who do not have insurance, but you will need to ask so you aren't surprised when you get to your appointment.  2) Contact Your Local Health Department Not all health departments have doctors that can see patients for sick visits, but many do, so it is worth a call to see if yours does. If you don't know where your local health department is, you can check in your phone book. The CDC also has a tool to help you locate your state's health department, and many state websites also have listings of all of their local health departments.  3) Find a Walk-in Clinic If your illness is not likely to be very severe or complicated, you may want to try a walk in clinic. These are popping up all over the country in pharmacies, drugstores, and shopping centers. They're usually staffed by nurse practitioners or physician assistants that have been trained to treat common illnesses and complaints. They're usually fairly quick and inexpensive. However, if you have serious medical issues or chronic medical problems, these are probably not your best option.  No Primary Care Doctor: - Call Health Connect at  [phone] - they can help you locate a primary care doctor that  accepts your insurance, provides certain services, etc. - Physician Referral Service- [phone]  Chronic Pain Problems: Organization         Address  Phone   Notes  [facility]  [phone] Patients need to be referred by their primary care doctor.   Medication Assistance: Organization         Address  Phone   Notes  [facility] [address] [place], [place] [address] [phone] --Must be a resident of [facility] -- Must have NO insurance coverage whatsoever (no  Medicaid/ Medicare, etc.) -- The pt. MUST have a primary care doctor that directs their care regularly and follows them in the community   [facility]  [phone]   [address]  [phone]  Agencies that provide inexpensive medical care: Organization         Address  Phone   Notes  [facility]  [phone]   [facility]    [phone]   [facility] [address] [place], [place] [address] [phone]   [facility] [address] [place] [place] [phone]   Planned Parenthood    [phone]   [facility]    [phone]   [facility]  [address], [place] Phone:  [phone], Fax:  [phone] Hours of Operation:  9 am - 6 pm, M-F.  Also accepts Medicaid/Medicare and self-pay.  [facility]  [address], [place] Phone: [phone], Fax: [phone]. Hours of Operation:  8:30 am - 5:30 pm, M-F.  Also accepts Medicaid and self-pay.  [facility] [place] [address] [place] Phone: [phone]   [facility] [address] Natasha Bence [place], [place] [phone], Ext. [phone] Mondays & Thursdays: 7-9 AM.  First 15 patients are seen on a first come, first serve basis.    Medicaid-accepting [facility] Providers:  Organization         Address  Phone   Notes  [facility] [address], [place] [phone] Also accepts self-pay patients.  [facility] [address] Laurell Josephs [place], [place]  [phone]   [facility] [address], [place] [phone]   [facility] [address], [place] [phone]   Renaye Rakers [address], [place]   [phone] Only accepts [place] patients after they have their name applied to their card.    Self-Pay (no insurance) in [facility]:  Organization         Address  Phone   Notes  Sickle Cell Patients, [facility] [address] [place], [place] [phone]   [facility] [address] [place], [place] [phone]   [facility] [place]  [address],  [phone]   [facility]/Dr. Osei-Bonsu  [address] [place] or [address], [place] [phone] Phone number for both [place] and [place] locations is the same.  [facility] [address], [place] [phone]   [facility] [address] [place] or [address] [phone]   [facility] [address], [place] [phone], phone; [phone], fax Sees patients 1st and 3rd Saturday of every month.  Must not qualify for public or private insurance (i.e. Medicaid, Medicare, [place] Health Choice, Veterans' Benefits)  Household income should be no more than 200% of the poverty level The clinic cannot treat you if you are pregnant or think you are pregnant  Sexually transmitted diseases are not treated at the clinic.    Dental Care: Organization         Address  Phone  Notes  [facility] [address] [place], [place] [phone] Accepts children up to age 21 who are enrolled in [place] or [place] Health Choice; pregnant women with a Medicaid card; and children who have applied for Medicaid or   Health Choice, but were declined, whose parents can pay a reduced fee at time of service.  [facility] Department of [facility]  [address], [place] [phone] Accepts children up to age 38 who are enrolled in [place] or [place] Health Choice; pregnant women with a Medicaid card; and children who have applied for Medicaid or [place] Health Choice,  but were declined, whose parents can pay a reduced fee at time of service.  [facility]  [address] [place], [place] [phone] Patients are seen by appointment only. Walk-ins are not accepted. [facility] will see patients 46 years of age and older. Monday - Tuesday (8am-5pm) Most Wednesdays (8:30-5pm) $30 per visit, cash only  [facility]  [address], [facility] [phone] Patients are seen by appointment only. Walk-ins are not accepted. [facility] will see patients 32 years of age and older. One Wednesday Evening (Monthly: Volunteer Based).  $30 per visit, cash only  [employer]  [phone] for adults; Children under age 43, call Graduate Pediatric Dentistry at [phone]. Children aged 81-14, please call [phone] to request a pediatric application.  Dental services are provided in all areas of dental care including fillings, crowns and bridges, complete and partial dentures, implants, gum treatment, root canals, and extractions. Preventive care is also provided. Treatment is provided to both adults and children. Patients are selected via a lottery and there is often a waiting list.   [facility] [address], [place]  [phone] [email or web address]   [facility] [address] [place], [place] [phone], Ext. [phone] Second and Fourth Thursday of each month, opens at 6:30 AM; Clinic ends at 9 AM.  Patients are seen on a first-come first-served basis, and a limited number are seen during each clinic.   [facility]  [address] Ether Griffins [place], [place] [phone]   Eligibility Requirements You must have lived in [place], [place], or [place] counties for at least the last three months.   You cannot be eligible for state or federal sponsored [place], including [employer], [place], or [employer].   You generally  cannot be eligible for healthcare insurance through your employer.    How to apply: Eligibility screenings are held every Tuesday and Wednesday afternoon from 1:00 pm until 4:00 pm. You do not need an appointment for the interview!  [facility] [address], [place], [place] [phone]   [facility] Health Department  [phone]   [facility]  [phone]   [facility]  [phone]    Behavioral Health Resources in the Community: Intensive Outpatient Programs Organization         Address  Phone  Notes  [facility] [address], [place], [place] [phone]   [facility] Outpatient [address] [place], [place] [phone]   [facility] [address], [place], [place]  [phone]   [facility] [address],  [place], [place] [phone] or [phone]   Substance Abuse Resources Organization         Address  Phone  Notes  [facility] Drug [facility]  [phone]   [facility]  [phone]   [facility]  [phone]   Floydene Flock  [phone]   Residential & Outpatient Substance Abuse Program  [phone]  Psychological Services Organization         Address  Phone  Notes  [facility]  [phone]- [phone]   [facility]  [phone]- [phone]   [facility] [phone], [place] [phone] or [phone]    Mobile Crisis Teams Organization         Address  Phone  Notes  [facility], Mobile Crisis Care Unit  [phone]   [facility]  [address] [place], [place] [phone]   Doristine LocksSharon DeEsch [address] [place] [phone]    Self-Help/Support Groups Organization         Address  Phone             Notes  [facility] [place] - variety of support groups  [phone] Call for more  information  Narcotics Anonymous (NA), [facility] [address], [place]  2 meetings at this location   [facility]istician[facility]idential Treatment Programs Organization         Address  Phone  Notes  [facility] [address],    [place]  [phone]   [facility]  [address], [place] [phone], [place], [place] [phone]   [facility] [address], [place] [phone] Admissions: 8am-3pm M-F  [facility] [address]    [place], [place] [phone]   [facility] [address], [place], [place] [phone]   [facility] [address]  [place], [place] [phone]   [facility] - Intensive Outpatient [address], [place], [place] [phone]   [facility] ([facility] [address]  [place], [place] [phone] or [phone]   [facility] ([facility]) [address] [place], [place] [phone] Accepts Medicaid  [facility] [address]  [place] [phone] Substance Abuse/Addiction Treatment   [facility] Resources Organization         Address  Phone  Notes  [facility]  [phone]   Angie FavaJulie Brannon, PhD [address], [place], [place]   [phone] or [phone]) [phone]   [facility]   [address] [place], [place] [phone]) [phone]   [facility] [address], [place], [place] [phone] Insurance/Medicaid/sponsorship through [facility]  [facility] [address]                                    [place], [place] [phone]) [phone] Therapy/tele-psych/case  [facility] [address]   [place], [place] [phone]) [phone]    Dr. Lolly MustacheArfeen  [phone]) [phone]   [facility]  [employer] [facility] 1) [address], [place] 2) [address], [place] 3)  [address], [place] [phone])  [phone]) [phone]  [phone]) [phone]   [facility] Child Abuse Hotline [phone](336) [phone] or [phone]) [phone] (After Hours)

## 2015-04-04 NOTE — ED Provider Notes (Signed)
CSN: [id number]     Arrival date & time 04/04/15  1025 History   First MD Initiated Contact with Patient 04/04/15 1352     Chief Complaint  Patient presents with  . Abdominal Pain     (Consider location/radiation/quality/duration/timing/severity/associated sxs/prior Treatment) The history is provided by the patient and medical records. No language interpreter was used.     ALIYA SOL is a 25 y.o. female  G1P0 with no major medical problems presents to the Emergency Department complaining of gradual, persistent, progressively worsening lower abdominal cramping onset 2 weeks ago. Associated symptoms include nausea today without vomiting.  Patient reports she is sexually active with one female partner. She states they are not actively preventing pregnancy. She denies vaginal discharge, vaginal bleeding, dysuria, hematuria, fever, chills.  Regarding relieving factors.  LMP: 03/06/15   History reviewed. No pertinent past medical history. History reviewed. No pertinent past surgical history. No family history on file. Social History  Substance Use Topics  . Smoking status: Former Smoker    Quit date: 11/13/2012  . Smokeless tobacco: Never Used  . Alcohol Use: No   OB History    No data available     Review of Systems  Constitutional: Negative for fever, diaphoresis, appetite change, fatigue and unexpected weight change.  HENT: Negative for mouth sores.   Eyes: Negative for visual disturbance.  Respiratory: Negative for cough, chest tightness, shortness of breath and wheezing.   Cardiovascular: Negative for chest pain.  Gastrointestinal: Positive for abdominal pain. Negative for nausea, vomiting, diarrhea and constipation.  Endocrine: Negative for polydipsia, polyphagia and polyuria.  Genitourinary: Negative for dysuria, urgency, frequency and hematuria.  Musculoskeletal: Negative for back pain and neck stiffness.  Skin: Negative for rash.  Allergic/Immunologic: Negative for  immunocompromised state.  Neurological: Negative for syncope, light-headedness and headaches.  Hematological: Does not bruise/bleed easily.  Psychiatric/Behavioral: Negative for sleep disturbance. The patient is not nervous/anxious.       Allergies  Review of patient's allergies indicates no known allergies.  Home Medications   Prior to Admission medications   Medication Sig Start Date End Date Taking? Authorizing Provider  Prenatal Vit-Fe Fumarate-FA (PRENATAL COMPLETE) 14-0.4 MG TABS Take 2 tablets by mouth daily. 04/04/15   Kyah Buesing, PA-C   BP 115/65 mmHg  Pulse 94  Temp(Src) 98.5 F (36.9 C) (Oral)  Resp 18  Ht  (1.6 m)  Wt 122.925 kg  BMI 48.02 kg/m2  SpO2 98%  LMP 03/06/2015 Physical Exam  Constitutional: She appears well-developed and well-nourished. No distress.  Awake, alert, nontoxic appearance  HENT:  Head: Normocephalic and atraumatic.  Mouth/Throat: Oropharynx is clear and moist. No oropharyngeal exudate.  Eyes: Conjunctivae are normal. No scleral icterus.  Neck: Normal range of motion. Neck supple.  Cardiovascular: Normal rate, regular rhythm, normal heart sounds and intact distal pulses.   No murmur heard. Pulmonary/Chest: Effort normal and breath sounds normal. No respiratory distress. She has no wheezes.  Equal chest expansion  Abdominal: Soft. Bowel sounds are normal. She exhibits no mass. There is no tenderness. There is no rebound and no guarding. Hernia confirmed negative in the right inguinal area and confirmed negative in the left inguinal area.  Genitourinary: Uterus normal. No labial fusion. There is no rash, tenderness or lesion on the right labia. There is no rash, tenderness or lesion on the left labia. Uterus is not deviated, not enlarged, not fixed and not tender. Cervix exhibits no motion tenderness, no discharge and no friability. Right adnexum displays  no mass, no tenderness and no fullness. Left adnexum displays no mass, no  tenderness and no fullness. No erythema, tenderness or bleeding in the vagina. No foreign body around the vagina. No signs of injury around the vagina. No vaginal discharge (Leukorrhea) found.  Musculoskeletal: Normal range of motion. She exhibits no edema.  Lymphadenopathy:       Right: No inguinal adenopathy present.       Left: No inguinal adenopathy present.  Neurological: She is alert.  Speech is clear and goal oriented Moves extremities without ataxia  Skin: Skin is warm and dry. She is not diaphoretic. No erythema.  Psychiatric: She has a normal mood and affect.  Nursing note and vitals reviewed.   ED Course  Procedures (including critical care time) Labs Review Labs Reviewed  COMPREHENSIVE METABOLIC PANEL - Abnormal; Notable for the following:    Glucose, Bld 110 (*)    All other components within normal limits  CBC - Abnormal; Notable for the following:    Hemoglobin 10.4 (*)    HCT 32.4 (*)    RDW 16.1 (*)    All other components within normal limits  URINALYSIS, ROUTINE W REFLEX MICROSCOPIC (NOT AT [facility]) - Abnormal; Notable for the following:    Color, Urine AMBER (*)    APPearance CLOUDY (*)    Specific Gravity, Urine 1.035 (*)    Ketones, ur 15 (*)    Leukocytes, UA SMALL (*)    All other components within normal limits  URINE MICROSCOPIC-ADD ON - Abnormal; Notable for the following:    Squamous Epithelial / LPF 6-30 (*)    Bacteria, UA FEW (*)    Casts HYALINE CASTS (*)    All other components within normal limits  I-STAT BETA HCG BLOOD, ED ([facility], [facility], [facility] ONLY) - Abnormal; Notable for the following:    I-stat hCG, quantitative 929.5 (*)    All other components within normal limits  WET PREP, GENITAL  LIPASE, BLOOD  GC/CHLAMYDIA PROBE AMP ([place]) NOT AT [facility]    Imaging Review US Ob Comp Less 14 Wks  04/04/2015  CLINICAL DATA:  Pregnant patient with midline pelvic pain for 2 weeks. Quantitative HCG 929.5. EXAM: OBSTETRIC <14 WK [place] AND TRANSVAGINAL OB US  TECHNIQUE: Both transabdominal and transvaginal ultrasound examinations were performed for complete evaluation of the gestation as well as the maternal uterus, adnexal regions, and pelvic cul-de-sac. Transvaginal technique was performed to assess early pregnancy. COMPARISON:  None. FINDINGS: Intrauterine gestational sac: Not visualized. The endometrial stripe measures up to 2.1 cm. Bicornuate uterus is noted. Yolk sac:  Not visualized. Embryo:  Not visualized. Cardiac Activity: Not applicable. MSD: Not applicable. Maternal uterus/adnexae: Unremarkable. IMPRESSION: No ectopic or intrauterine pregnancy is identified. The endometrial stripe is thickened. Findings are nonspecific and could be due to early pregnancy or impending abortion. Recommend follow-up quantitative HCG. Repeat ultrasound in 10 days could also be used for further evaluation. Bicornuate uterus. Electronically Signed   By: Drusilla Kanner M.D.   On: 04/04/2015 15:02   US Ob Transvaginal  04/04/2015  CLINICAL DATA:  Pregnant patient with midline pelvic pain for 2 weeks. Quantitative HCG 929.5. EXAM: OBSTETRIC <14 WK [place] AND TRANSVAGINAL OB US TECHNIQUE: Both transabdominal and transvaginal ultrasound examinations were performed for complete evaluation of the gestation as well as the maternal uterus, adnexal regions, and pelvic cul-de-sac. Transvaginal technique was performed to assess early pregnancy. COMPARISON:  None. FINDINGS: Intrauterine gestational sac: Not visualized. The endometrial stripe measures up to 2.1  cm. Bicornuate uterus is noted. Yolk sac:  Not visualized. Embryo:  Not visualized. Cardiac Activity: Not applicable. MSD: Not applicable. Maternal uterus/adnexae: Unremarkable. IMPRESSION: No ectopic or intrauterine pregnancy is identified. The endometrial stripe is thickened. Findings are nonspecific and could be due to early pregnancy or impending abortion. Recommend follow-up quantitative HCG. Repeat ultrasound in 10 days could also  be used for further evaluation. Bicornuate uterus. Electronically Signed   By: Drusilla Kanner M.D.   On: 04/04/2015 15:02   I have personally reviewed and evaluated these images and lab results as part of my medical decision-making.    MDM   Final diagnoses:  Lower abdominal pain  Pregnancy   Bijou L Genet resents for several weeks of lower abdominal cramping and new onset nausea today.  Vomiting and tolerating by mouth. Labs reassuring. Mild anemia at 10.4. Positive pregnancy test with hCG of 929.  Pelvic exam reassuring without vaginal discharge, vaginal bleeding or adnexal tenderness.  Ultrasound shows no ectopic or intrauterine pregnancy identified at this time, the patient does have a thickened endometrial stripe.  Prep without evidence of infection.  Discussed with patient that this is potentially an early pregnancy versus early ectopic. Discussed the importance of 48 hr follow-up for repeat ECG testing.  She does not have an OB/GYN. She will follow-up at the [facility] and then with the women's clinic for further evaluation and testing. Discussed reasons to return sooner including worsening abdominal pain, syncope, vaginal bleeding.  BP 115/65 mmHg  Pulse 94  Temp(Src) 98.5 F (36.9 C) (Oral)  Resp 18  Ht  (1.6 m)  Wt 122.925 kg  BMI 48.02 kg/m2  SpO2 98%  LMP 03/06/2015   Dierdre Forth, PA-C 04/04/15 1620  Laurence Spates, MD 04/05/15 [phone]

## 2015-04-04 NOTE — ED Notes (Signed)
Patient states abdominal pain x 2 weeks.  Patient states no nausea and vomiting until today.   Patient states just nausea this morning.  Denies other symptoms.

## 2015-04-06 ENCOUNTER — Encounter (HOSPITAL_COMMUNITY): Payer: Self-pay | Admitting: Obstetrics and Gynecology

## 2015-04-06 ENCOUNTER — Inpatient Hospital Stay (HOSPITAL_COMMUNITY)
Admission: AD | Admit: 2015-04-06 | Discharge: 2015-04-06 | Disposition: A | Discharge status: Home / Self Care | Payer: Medicaid Other | Source: Ambulatory Visit | Attending: Obstetrics & Gynecology | Admitting: Obstetrics & Gynecology

## 2015-04-06 DIAGNOSIS — O3401 Maternal care for unspecified congenital malformation of uterus, first trimester: Secondary | ICD-10-CM | POA: Diagnosis not present

## 2015-04-06 DIAGNOSIS — O3680X0 Pregnancy with inconclusive fetal viability, not applicable or unspecified: Secondary | ICD-10-CM

## 2015-04-06 DIAGNOSIS — O9989 Other specified diseases and conditions complicating pregnancy, childbirth and the puerperium: Secondary | ICD-10-CM

## 2015-04-06 DIAGNOSIS — R109 Unspecified abdominal pain: Secondary | ICD-10-CM | POA: Insufficient documentation

## 2015-04-06 DIAGNOSIS — O26899 Other specified pregnancy related conditions, unspecified trimester: Secondary | ICD-10-CM

## 2015-04-06 DIAGNOSIS — Z3A01 Less than 8 weeks gestation of pregnancy: Secondary | ICD-10-CM | POA: Insufficient documentation

## 2015-04-06 DIAGNOSIS — Q513 Bicornate uterus: Secondary | ICD-10-CM | POA: Diagnosis not present

## 2015-04-06 LAB — HCG, QUANTITATIVE, PREGNANCY: hCG, Beta Chain, Quant, S: 2724 m[IU]/mL — ABNORMAL HIGH (ref <5)

## 2015-04-06 MED ORDER — PRENATAL PLUS 27-1 MG PO TABS: 1.0000 | ORAL_TABLET | Freq: Every day | ORAL | Status: DC

## 2015-04-06 NOTE — MAU Note (Signed)
Pt here for F/U BHCG.  Pt has lower abd pain today that she says is the same as 2 days ago.  Denies bleeding.

## 2015-04-06 NOTE — Discharge Instructions (Signed)

## 2015-04-06 NOTE — MAU Provider Note (Signed)
REASON for VISIT:  follow-up quantitative beta hCG  SUBJECTIVE:  Tonya Walls is a 25 y.o. G1P0 at 34w3dby LMP 03/06/2015. [facility] for two-week history of lower abdominal cramping pain. She's had no further abdominal pain and no vaginal bleeding. Results from that visit follow: Recent Results (from the past 2160 hour(s))  Lipase, blood     Status: None   Collection Time: 04/04/15 11:11 AM  Result Value Ref Range   Lipase 22 11 - 51 U/L  Comprehensive metabolic panel     Status: Abnormal   Collection Time: 04/04/15 11:11 AM  Result Value Ref Range   Sodium 139 135 - 145 mmol/L   Potassium 3.7 3.5 - 5.1 mmol/L   Chloride 108 101 - 111 mmol/L   CO2 22 22 - 32 mmol/L   Glucose, Bld 110 (H) 65 - 99 mg/dL   BUN 15 6 - 20 mg/dL   Creatinine, Ser 0.57 0.44 - 1.00 mg/dL   Calcium 9.2 8.9 - 10.3 mg/dL   Total Protein 7.4 6.5 - 8.1 g/dL   Albumin 3.5 3.5 - 5.0 g/dL   AST 16 15 - 41 U/L   ALT 15 14 - 54 U/L   Alkaline Phosphatase 48 38 - 126 U/L   Total Bilirubin 0.7 0.3 - 1.2 mg/dL   GFR calc non Af Amer >60 >60 mL/min   GFR calc Af Amer >60 >60 mL/min    Comment: (NOTE) The eGFR has been calculated using the CKD EPI equation. This calculation has not been validated in all clinical situations. eGFR's persistently <60 mL/min signify possible Chronic Kidney Disease.    Anion gap 9 5 - 15  CBC     Status: Abnormal   Collection Time: 04/04/15 11:11 AM  Result Value Ref Range   WBC 5.0 4.0 - 10.5 K/uL   RBC 3.88 3.87 - 5.11 MIL/uL   Hemoglobin 10.4 (L) 12.0 - 15.0 g/dL   HCT 32.4 (L) 36.0 - 46.0 %   MCV 83.5 78.0 - 100.0 fL   MCH 26.8 26.0 - 34.0 pg   MCHC 32.1 30.0 - 36.0 g/dL   RDW 16.1 (H) 11.5 - 15.5 %   Platelets 390 150 - 400 K/uL  I-Stat beta hCG blood, ED ([facility], [facility], [facility] only)     Status: Abnormal   Collection Time: 04/04/15 11:19 AM  Result Value Ref Range   I-stat hCG, quantitative 929.5 (H) <5 mIU/mL   Comment 3             Comment:   GEST. AGE      CONC.  (mIU/mL)   <=1 WEEK        5 - 50     2 WEEKS       50 - 500     3 WEEKS       100 - 10,000     4 WEEKS     1,000 - 30,000        FEMALE AND NON-PREGNANT FEMALE:     LESS THAN 5 mIU/mL   Urinalysis, Routine w reflex microscopic ([facility]     Status: Abnormal   Collection Time: 04/04/15  2:12 PM  Result Value Ref Range   Color, Urine AMBER (A) YELLOW    Comment: BIOCHEMICALS MAY BE AFFECTED BY COLOR   APPearance CLOUDY (A) CLEAR   Specific Gravity, Urine 1.035 (H) 1.005 - 1.030   pH 6.5 5.0 - 8.0  Glucose, UA NEGATIVE NEGATIVE mg/dL   Hgb urine dipstick NEGATIVE NEGATIVE   Bilirubin Urine NEGATIVE NEGATIVE   Ketones, ur 15 (A) NEGATIVE mg/dL   Protein, ur NEGATIVE NEGATIVE mg/dL   Nitrite NEGATIVE NEGATIVE   Leukocytes, UA SMALL (A) NEGATIVE  Urine microscopic-add on     Status: Abnormal   Collection Time: 04/04/15  2:12 PM  Result Value Ref Range   Squamous Epithelial / LPF 6-30 (A) NONE SEEN   WBC, UA 0-5 0 - 5 WBC/hpf   RBC / HPF 0-5 0 - 5 RBC/hpf   Bacteria, UA FEW (A) NONE SEEN   Casts HYALINE CASTS (A) NEGATIVE   Urine-Other MUCOUS PRESENT   Wet prep, genital     Status: None   Collection Time: 04/04/15  3:35 PM  Result Value Ref Range   Yeast Wet Prep HPF POC NONE SEEN NONE SEEN   Trich, Wet Prep NONE SEEN NONE SEEN   Clue Cells Wet Prep HPF POC NONE SEEN NONE SEEN   WBC, Wet Prep HPF POC NONE SEEN NONE SEEN   Sperm NONE SEEN    US Ob Comp Less 14 Wks  04/04/2015  CLINICAL DATA:  Pregnant patient with midline pelvic pain for 2 weeks. Quantitative HCG 929.5. EXAM: OBSTETRIC <14 WK [place] AND TRANSVAGINAL OB US TECHNIQUE: Both transabdominal and transvaginal ultrasound examinations were performed for complete evaluation of the gestation as well as the maternal uterus, adnexal regions, and pelvic cul-de-sac. Transvaginal technique was performed to assess early pregnancy. COMPARISON:  None. FINDINGS: Intrauterine gestational sac: Not  visualized. The endometrial stripe measures up to 2.1 cm. Bicornuate uterus is noted. Yolk sac:  Not visualized. Embryo:  Not visualized. Cardiac Activity: Not applicable. MSD: Not applicable. Maternal uterus/adnexae: Unremarkable. IMPRESSION: No ectopic or intrauterine pregnancy is identified. The endometrial stripe is thickened. Findings are nonspecific and could be due to early pregnancy or impending abortion. Recommend follow-up quantitative HCG. Repeat ultrasound in 10 days could also be used for further evaluation. Bicornuate uterus. Electronically Signed   By: Inge Rise M.D.   On: 04/04/2015 15:02   US Ob Transvaginal  04/04/2015  CLINICAL DATA:  Pregnant patient with midline pelvic pain for 2 weeks. Quantitative HCG 929.5. EXAM: OBSTETRIC <14 WK [place] AND TRANSVAGINAL OB US TECHNIQUE: Both transabdominal and transvaginal ultrasound examinations were performed for complete evaluation of the gestation as well as the maternal uterus, adnexal regions, and pelvic cul-de-sac. Transvaginal technique was performed to assess early pregnancy. COMPARISON:  None. FINDINGS: Intrauterine gestational sac: Not visualized. The endometrial stripe measures up to 2.1 cm. Bicornuate uterus is noted. Yolk sac:  Not visualized. Embryo:  Not visualized. Cardiac Activity: Not applicable. MSD: Not applicable. Maternal uterus/adnexae: Unremarkable. IMPRESSION: No ectopic or intrauterine pregnancy is identified. The endometrial stripe is thickened. Findings are nonspecific and could be due to early pregnancy or impending abortion. Recommend follow-up quantitative HCG. Repeat ultrasound in 10 days could also be used for further evaluation. Bicornuate uterus. Electronically Signed   By: Inge Rise M.D.   On: 04/04/2015 15:02   OBJECTIVE: Filed Vitals:   04/06/15 1507  BP: 131/71  Pulse: 103  Temp: 98 F (36.7 C)  Resp: 18   General: WN/WD in NAD Abd: soft, NT   Results for orders placed or performed during the  hospital encounter of 04/06/15 (from the past 24 hour(s))  hCG, quantitative, pregnancy     Status: Abnormal   Collection Time: 04/06/15  3:16 PM  Result Value Ref Range  hCG, Beta Chain, Quant, S 2724 (H) <5 mIU/mL   ASSESSMENT: 1. 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 Pregnancy of unknown anatomic location   3. Abdominal pain affecting pregnancy    PLAN: Discharge with reassurance that quantitative beta hCG is rising F/U US scheduled with [facility] to review with patient Informed of bicornuate uterus    Medication List    STOP taking these medications        PRENATAL COMPLETE 14-0.4 MG Tabs  Replaced by:  prenatal vitamin w/FE, FA 27-1 MG Tabs tablet      TAKE these medications        prenatal vitamin w/FE, FA 27-1 MG Tabs tablet  Take 1 tablet by mouth daily.       Follow-up Information    Follow up with [facility].   Specialty:  Obstetrics and Gynecology   Why:  Someone from Clinic will call you with appt. After your [place] you will go to the Outpatient clinic   Contact information:   [place] [phone]

## 2015-04-07 LAB — GC/CHLAMYDIA PROBE AMP (~~LOC~~) NOT AT ARMC
Chlamydia: NEGATIVE
Neisseria Gonorrhea: NEGATIVE

## 2015-04-09 ENCOUNTER — Telehealth: Payer: Self-pay | Admitting: General Practice

## 2015-04-09 DIAGNOSIS — Z3490 Encounter for supervision of normal pregnancy, unspecified, unspecified trimester: Secondary | ICD-10-CM

## 2015-04-09 NOTE — Telephone Encounter (Signed)
Patient needs f/u ultrasound for viability and clinic appt after u/s. Ultrasound scheduled for 1/31 @ 8am. Called patient, no answer- left message stating we are trying to reach you regarding an appt, please call us back at the clinics

## 2015-04-10 ENCOUNTER — Encounter: Payer: Self-pay | Admitting: *Deleted

## 2015-04-10 NOTE — Telephone Encounter (Signed)
Called Tonya Walls and left a message we are trying to reach you about an appointment - please call the clinic. Also called her contact person and asked her if she had a better phone number to reach Tonya Walls as we have not been able to reach her. She had same number as I had, but will tell Tonya Walls when she sees her to check her messages and call us.  Will also send certified letter.

## 2015-04-15 ENCOUNTER — Encounter: Payer: Self-pay | Admitting: *Deleted

## 2015-04-15 ENCOUNTER — Encounter: Payer: Self-pay | Admitting: Family Medicine

## 2015-04-15 ENCOUNTER — Ambulatory Visit (HOSPITAL_COMMUNITY)
Admission: RE | Admit: 2015-04-15 | Discharge: 2015-04-15 | Disposition: A | Discharge status: Home / Self Care | Payer: Medicaid Other | Source: Ambulatory Visit | Attending: Obstetrics and Gynecology | Admitting: Obstetrics and Gynecology

## 2015-04-15 ENCOUNTER — Ambulatory Visit (INDEPENDENT_AMBULATORY_CARE_PROVIDER_SITE_OTHER): Payer: Self-pay | Admitting: Advanced Practice Midwife

## 2015-04-15 ENCOUNTER — Other Ambulatory Visit: Payer: Self-pay | Admitting: General Practice

## 2015-04-15 DIAGNOSIS — O3400 Maternal care for unspecified congenital malformation of uterus, unspecified trimester: Secondary | ICD-10-CM | POA: Insufficient documentation

## 2015-04-15 DIAGNOSIS — O3680X0 Pregnancy with inconclusive fetal viability, not applicable or unspecified: Secondary | ICD-10-CM

## 2015-04-15 DIAGNOSIS — O3481 Maternal care for other abnormalities of pelvic organs, first trimester: Secondary | ICD-10-CM | POA: Diagnosis not present

## 2015-04-15 DIAGNOSIS — N8311 Corpus luteum cyst of right ovary: Secondary | ICD-10-CM | POA: Diagnosis not present

## 2015-04-15 DIAGNOSIS — Z3401 Encounter for supervision of normal first pregnancy, first trimester: Secondary | ICD-10-CM

## 2015-04-15 DIAGNOSIS — Z36 Encounter for antenatal screening of mother: Secondary | ICD-10-CM | POA: Diagnosis not present

## 2015-04-15 DIAGNOSIS — Z3490 Encounter for supervision of normal pregnancy, unspecified, unspecified trimester: Secondary | ICD-10-CM

## 2015-04-15 DIAGNOSIS — O34591 Maternal care for other abnormalities of gravid uterus, first trimester: Secondary | ICD-10-CM

## 2015-04-15 DIAGNOSIS — Q513 Bicornate uterus: Secondary | ICD-10-CM | POA: Diagnosis not present

## 2015-04-15 DIAGNOSIS — Z3A01 Less than 8 weeks gestation of pregnancy: Secondary | ICD-10-CM | POA: Insufficient documentation

## 2015-04-15 DIAGNOSIS — O3401 Maternal care for unspecified congenital malformation of uterus, first trimester: Secondary | ICD-10-CM | POA: Diagnosis not present

## 2015-04-15 NOTE — Progress Notes (Signed)
Updated dating based on U/S 04/15/15.

## 2015-04-15 NOTE — Progress Notes (Signed)
Ultrasounds Results Note  SUBJECTIVE HPI:  [name] is a 25 y.o. G1P0 at [redacted]w[redacted]d by LMP who presents to the [facility] for followup ultrasound results. The patient denies abdominal pain or vaginal bleeding.  Upon review of the patient's records, patient was first seen in ED on 1/20 for abdominal pain in pregnancy.   BHCG on that day was 929.  Ultrasound showed no IUP, no ectopic.  Last seen in [facility]U on 1/22. BHCG was 2724.  Repeat ultrasound was performed earlier today.   No past medical history on file. No past surgical history on file. Social History   Social History  . Marital Status: Single    Spouse Name: N/A  . Number of Children: N/A  . Years of Education: N/A   Occupational History  . Not on file.   Social History Main Topics  . Smoking status: Former Smoker    Quit date: 11/13/2012  . Smokeless tobacco: Never Used  . Alcohol Use: No  . Drug Use: No  . Sexual Activity: Yes    Birth Control/ Protection: None   Other Topics Concern  . Not on file   Social History Narrative   Current Outpatient Prescriptions on File Prior to Visit  Medication Sig Dispense Refill  . prenatal vitamin w/FE, FA (PRENATAL 1 + 1) 27-1 MG TABS tablet Take 1 tablet by mouth daily. 30 each 0  . [DISCONTINUED] ferrous sulfate 325 (65 FE) MG tablet Take 1 tablet (325 mg total) by mouth daily. (Patient not taking: Reported on 04/04/2015) 30 tablet 0  . [DISCONTINUED] metoCLOPramide (REGLAN) 10 MG tablet Take 1 tablet (10 mg total) by mouth every 6 (six) hours. 30 tablet 0  . [DISCONTINUED] omeprazole (PRILOSEC) 20 MG capsule Take 1 capsule (20 mg total) by mouth daily. (Patient not taking: Reported on 04/04/2015) 30 capsule 3   No current facility-administered medications on file prior to visit.   No Known Allergies  I have reviewed patient's Past Medical Hx, Surgical Hx, Family Hx, Social Hx, medications and allergies.   Review of Systems Review of Systems  Constitutional:  Negative for fever and chills.  Gastrointestinal: Negative for nausea, vomiting, abdominal pain, diarrhea and constipation.  Genitourinary: Negative for dysuria.  Musculoskeletal: Negative for back pain.  Neurological: Negative for dizziness and weakness.    Physical Exam  LMP 03/06/2015 (Approximate)  GENERAL: Well-developed, well-nourished female in no acute distress.  HEENT: Normocephalic, atraumatic.   LUNGS: Effort normal ABDOMEN: soft, non-tender HEART: Regular rate  SKIN: Warm, dry and without erythema PSYCH: Normal mood and affect NEURO: Alert and oriented x 4  LAB RESULTS No results found for this or any previous visit (from the past 24 hour(s)).  IMAGING US Ob Comp Less 14 Wks  04/04/2015  CLINICAL DATA:  Pregnant patient with midline pelvic pain for 2 weeks. Quantitative HCG 929.5. EXAM: OBSTETRIC <14 WK [place] AND TRANSVAGINAL OB US TECHNIQUE: Both transabdominal and transvaginal ultrasound examinations were performed for complete evaluation of the gestation as well as the maternal uterus, adnexal regions, and pelvic cul-de-sac. Transvaginal technique was performed to assess early pregnancy. COMPARISON:  None. FINDINGS: Intrauterine gestational sac: Not visualized. The endometrial stripe measures up to 2.1 cm. Bicornuate uterus is noted. Yolk sac:  Not visualized. Embryo:  Not visualized. Cardiac Activity: Not applicable. MSD: Not applicable. Maternal uterus/adnexae: Unremarkable. IMPRESSION: No ectopic or intrauterine pregnancy is identified. The endometrial stripe is thickened. Findings are nonspecific and could be due to early pregnancy or impending abortion.  Recommend follow-up quantitative HCG. Repeat ultrasound in 10 days could also be used for further evaluation. Bicornuate uterus. Electronically Signed   By: Drusilla Kanner M.D.   On: 04/04/2015 15:02   US Ob Transvaginal  04/15/2015  CLINICAL DATA:  Early stage pregnancy. EXAM: TRANSVAGINAL OB ULTRASOUND TECHNIQUE:  Transvaginal ultrasound was performed for complete evaluation of the gestation as well as the maternal uterus, adnexal regions, and pelvic cul-de-sac. COMPARISON:  04/04/2015. FINDINGS: Intrauterine gestational sac: Visualized/normal in shape. Bicornuate uterus. Gestational sac on the right. Yolk sac:  Visualized. Embryo:  Visualized. Cardiac Activity: Visualized. Heart Rate: 102 bpm CRL:   0.3 cm 5 w 6 d                  [place] EDC: 12/10/2015. Subchorionic hemorrhage: Questionable 4.6 x 4.1 x 1.5 cm subchorionic hemorrhage. Maternal uterus/adnexae: Adnexae and ovaries poorly visualized due to bowel gas. Questionable small right ovarian corpus luteal cyst IMPRESSION: 1. Bicornuate uterus. Single viable intrauterine 5 week 6 day pregnancy on the right. 2. Fetal heart rate of 102 beats per minute. 3. Small subchorionic hemorrhage. These results will be called to the ordering clinician or representative by the Radiologist Assistant, and communication documented in the PACS or zVision Dashboard. Electronically Signed   By: Maisie Fus  Register   On: 04/15/2015 08:36   US Ob Transvaginal  04/04/2015  CLINICAL DATA:  Pregnant patient with midline pelvic pain for 2 weeks. Quantitative HCG 929.5. EXAM: OBSTETRIC <14 WK [place] AND TRANSVAGINAL OB US TECHNIQUE: Both transabdominal and transvaginal ultrasound examinations were performed for complete evaluation of the gestation as well as the maternal uterus, adnexal regions, and pelvic cul-de-sac. Transvaginal technique was performed to assess early pregnancy. COMPARISON:  None. FINDINGS: Intrauterine gestational sac: Not visualized. The endometrial stripe measures up to 2.1 cm. Bicornuate uterus is noted. Yolk sac:  Not visualized. Embryo:  Not visualized. Cardiac Activity: Not applicable. MSD: Not applicable. Maternal uterus/adnexae: Unremarkable. IMPRESSION: No ectopic or intrauterine pregnancy is identified. The endometrial stripe is thickened. Findings are nonspecific and could  be due to early pregnancy or impending abortion. Recommend follow-up quantitative HCG. Repeat ultrasound in 10 days could also be used for further evaluation. Bicornuate uterus. Electronically Signed   By: Drusilla Kanner M.D.   On: 04/04/2015 15:02    ASSESSMENT 1. Pregnancy, first, first trimester   2. 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

## 2015-04-15 NOTE — Patient Instructions (Signed)
First Trimester of Pregnancy The first trimester of pregnancy is from week 1 until the end of week 12 (months 1 through 3). A week after a sperm fertilizes an egg, the egg will implant on the wall of the uterus. This embryo will begin to develop into a baby. Genes from you and your partner are forming the baby. The female genes determine whether the baby is a boy or a girl. At 6-8 weeks, the eyes and face are formed, and the heartbeat can be seen on ultrasound. At the end of 12 weeks, all the baby's organs are formed.  Now that you are pregnant, you will want to do everything you can to have a healthy baby. Two of the most important things are to get good prenatal care and to follow your health care provider's instructions. Prenatal care is all the medical care you receive before the baby's birth. This care will help prevent, find, and treat any problems during the pregnancy and childbirth. BODY CHANGES Your body goes through many changes during pregnancy. The changes vary from woman to woman.   You may gain or lose a couple of pounds at first.  You may feel sick to your stomach (nauseous) and throw up (vomit). If the vomiting is uncontrollable, call your health care provider.  You may tire easily.  You may develop headaches that can be relieved by medicines approved by your health care provider.  You may urinate more often. Painful urination may mean you have a bladder infection.  You may develop heartburn as a result of your pregnancy.  You may develop constipation because certain hormones are causing the muscles that push waste through your intestines to slow down.  You may develop hemorrhoids or swollen, bulging veins (varicose veins).  Your breasts may begin to grow larger and become tender. Your nipples may stick out more, and the tissue that surrounds them (areola) may become darker.  Your gums may bleed and may be sensitive to brushing and flossing.  Dark spots or blotches (chloasma,  mask of pregnancy) may develop on your face. This will likely fade after the baby is born.  Your menstrual periods will stop.  You may have a loss of appetite.  You may develop cravings for certain kinds of food.  You may have changes in your emotions from day to day, such as being excited to be pregnant or being concerned that something may go wrong with the pregnancy and baby.  You may have more vivid and strange dreams.  You may have changes in your hair. These can include thickening of your hair, rapid growth, and changes in texture. Some women also have hair loss during or after pregnancy, or hair that feels dry or thin. Your hair will most likely return to normal after your baby is born. WHAT TO EXPECT AT YOUR PRENATAL VISITS During a routine prenatal visit:  You will be weighed to make sure you and the baby are growing normally.  Your blood pressure will be taken.  Your abdomen will be measured to track your baby's growth.  The fetal heartbeat will be listened to starting around week 10 or 12 of your pregnancy.  Test results from any previous visits will be discussed. Your health care provider may ask you:  How you are feeling.  If you are feeling the baby move.  If you have had any abnormal symptoms, such as leaking fluid, bleeding, severe headaches, or abdominal cramping.  If you are using any tobacco products,   including cigarettes, chewing tobacco, and electronic cigarettes.  If you have any questions. Other tests that may be performed during your first trimester include:  Blood tests to find your blood type and to check for the presence of any previous infections. They will also be used to check for low iron levels (anemia) and Rh antibodies. Later in the pregnancy, blood tests for diabetes will be done along with other tests if problems develop.  Urine tests to check for infections, diabetes, or protein in the urine.  An ultrasound to confirm the proper growth  and development of the baby.  An amniocentesis to check for possible genetic problems.  Fetal screens for spina bifida and Down syndrome.  You may need other tests to make sure you and the baby are doing well.  HIV (human immunodeficiency virus) testing. Routine prenatal testing includes screening for HIV, unless you choose not to have this test. HOME CARE INSTRUCTIONS  Medicines  Follow your health care provider's instructions regarding medicine use. Specific medicines may be either safe or unsafe to take during pregnancy.  Take your prenatal vitamins as directed.  If you develop constipation, try taking a stool softener if your health care provider approves. Diet  Eat regular, well-balanced meals. Choose a variety of foods, such as meat or vegetable-based protein, fish, milk and low-fat dairy products, vegetables, fruits, and whole grain breads and cereals. Your health care provider will help you determine the amount of weight gain that is right for you.  Avoid raw meat and uncooked cheese. These carry germs that can cause birth defects in the baby.  Eating four or five small meals rather than three large meals a day may help relieve nausea and vomiting. If you start to feel nauseous, eating a few soda crackers can be helpful. Drinking liquids between meals instead of during meals also seems to help nausea and vomiting.  If you develop constipation, eat more high-fiber foods, such as fresh vegetables or fruit and whole grains. Drink enough fluids to keep your urine clear or pale yellow. Activity and Exercise  Exercise only as directed by your health care provider. Exercising will help you:  Control your weight.  Stay in shape.  Be prepared for labor and delivery.  Experiencing pain or cramping in the lower abdomen or low back is a good sign that you should stop exercising. Check with your health care provider before continuing normal exercises.  Try to avoid standing for long  periods of time. Move your legs often if you must stand in one place for a long time.  Avoid heavy lifting.  Wear low-heeled shoes, and practice good posture.  You may continue to have sex unless your health care provider directs you otherwise. Relief of Pain or Discomfort  Wear a good support bra for breast tenderness.   Take warm sitz baths to soothe any pain or discomfort caused by hemorrhoids. Use hemorrhoid cream if your health care provider approves.   Rest with your legs elevated if you have leg cramps or low back pain.  If you develop varicose veins in your legs, wear support hose. Elevate your feet for 15 minutes, 3-4 times a day. Limit salt in your diet. Prenatal Care  Schedule your prenatal visits by the twelfth week of pregnancy. They are usually scheduled monthly at first, then more often in the last 2 months before delivery.  Write down your questions. Take them to your prenatal visits.  Keep all your prenatal visits as directed by your   health care provider. °Safety °· Wear your seat belt at all times when driving. °· Make a list of emergency phone numbers, including numbers for family, friends, the hospital, and police and fire departments. °General Tips °· Ask your health care provider for a referral to a local prenatal education class. Begin classes no later than at the beginning of month 6 of your pregnancy. °· Ask for help if you have counseling or nutritional needs during pregnancy. Your health care provider can offer advice or refer you to specialists for help with various needs. °· Do not use hot tubs, steam rooms, or saunas. °· Do not douche or use tampons or scented sanitary pads. °· Do not cross your legs for long periods of time. °· Avoid cat litter boxes and soil used by cats. These carry germs that can cause birth defects in the baby and possibly loss of the fetus by miscarriage or stillbirth. °· Avoid all smoking, herbs, alcohol, and medicines not prescribed by  your health care provider. Chemicals in these affect the formation and growth of the baby. °· Do not use any tobacco products, including cigarettes, chewing tobacco, and electronic cigarettes. If you need help quitting, ask your health care provider. You may receive counseling support and other resources to help you quit. °· Schedule a dentist appointment. At home, brush your teeth with a soft toothbrush and be gentle when you floss. °SEEK MEDICAL CARE IF:  °· You have dizziness. °· You have mild pelvic cramps, pelvic pressure, or nagging pain in the abdominal area. °· You have persistent nausea, vomiting, or diarrhea. °· You have a bad smelling vaginal discharge. °· You have pain with urination. °· You notice increased swelling in your face, hands, legs, or ankles. °SEEK IMMEDIATE MEDICAL CARE IF:  °· You have a fever. °· You are leaking fluid from your vagina. °· You have spotting or bleeding from your vagina. °· You have severe abdominal cramping or pain. °· You have rapid weight gain or loss. °· You vomit blood or material that looks like coffee grounds. °· You are exposed to German measles and have never had them. °· You are exposed to fifth disease or chickenpox. °· You develop a severe headache. °· You have shortness of breath. °· You have any kind of trauma, such as from a fall or a car accident. °  °This information is not intended to replace advice given to you by your health care provider. Make sure you discuss any questions you have with your health care provider. °  °Document Released: 02/23/2001 Document Revised: 03/22/2014 Document Reviewed: 01/09/2013 °Elsevier Interactive Patient Education ©2016 Elsevier Inc. ° ° °Prenatal Care Providers °[facility]    [facility] ° Phone- [phone]     Phone: [phone] °         °[facility]                      [facility] ° @[place]     Phone: [phone] ° Phone: [phone] ° °[facility]  °           @ [place] °           Phone: [phone] °        [facility] °[facility]     Phone: [phone] ° Phone: [phone]   °        [facility] @ [place]                hone: [phone]  Phone: [phone]         [facility] Dr. Francoise Ceo      Phone: [phone]  Phone: [phone]         [facility]                Phone: [phone]  Women's Health   Phone:[phone]                 Family [address] [place])          Phone: [phone] [facility]   Phone: [phone]

## 2015-05-28 ENCOUNTER — Encounter: Payer: Self-pay | Admitting: Advanced Practice Midwife

## 2015-05-28 ENCOUNTER — Other Ambulatory Visit (HOSPITAL_COMMUNITY)
Admission: RE | Admit: 2015-05-28 | Discharge: 2015-05-28 | Disposition: A | Discharge status: Home / Self Care | Payer: Medicaid Other | Source: Ambulatory Visit | Attending: Advanced Practice Midwife | Admitting: Advanced Practice Midwife

## 2015-05-28 ENCOUNTER — Ambulatory Visit (INDEPENDENT_AMBULATORY_CARE_PROVIDER_SITE_OTHER): Payer: Medicaid Other | Admitting: Advanced Practice Midwife

## 2015-05-28 VITALS — BP 133/66 | HR 103 | Temp 98.8°F | Wt 279.4 lb

## 2015-05-28 DIAGNOSIS — Q513 Bicornate uterus: Secondary | ICD-10-CM | POA: Diagnosis not present

## 2015-05-28 DIAGNOSIS — E669 Obesity, unspecified: Secondary | ICD-10-CM

## 2015-05-28 DIAGNOSIS — O99211 Obesity complicating pregnancy, first trimester: Secondary | ICD-10-CM

## 2015-05-28 DIAGNOSIS — O34591 Maternal care for other abnormalities of gravid uterus, first trimester: Secondary | ICD-10-CM

## 2015-05-28 DIAGNOSIS — O26891 Other specified pregnancy related conditions, first trimester: Secondary | ICD-10-CM

## 2015-05-28 DIAGNOSIS — Z124 Encounter for screening for malignant neoplasm of cervix: Secondary | ICD-10-CM

## 2015-05-28 DIAGNOSIS — O3680X1 Pregnancy with inconclusive fetal viability, fetus 1: Secondary | ICD-10-CM

## 2015-05-28 DIAGNOSIS — O09899 Supervision of other high risk pregnancies, unspecified trimester: Secondary | ICD-10-CM

## 2015-05-28 DIAGNOSIS — N898 Other specified noninflammatory disorders of vagina: Secondary | ICD-10-CM

## 2015-05-28 DIAGNOSIS — O3680X0 Pregnancy with inconclusive fetal viability, not applicable or unspecified: Secondary | ICD-10-CM | POA: Diagnosis not present

## 2015-05-28 DIAGNOSIS — O3401 Maternal care for unspecified congenital malformation of uterus, first trimester: Secondary | ICD-10-CM

## 2015-05-28 DIAGNOSIS — Z01419 Encounter for gynecological examination (general) (routine) without abnormal findings: Secondary | ICD-10-CM | POA: Insufficient documentation

## 2015-05-28 DIAGNOSIS — O0990 Supervision of high risk pregnancy, unspecified, unspecified trimester: Secondary | ICD-10-CM | POA: Insufficient documentation

## 2015-05-28 LAB — POCT URINALYSIS DIP (DEVICE)
Glucose, UA: NEGATIVE mg/dL
Hgb urine dipstick: NEGATIVE
NITRITE: NEGATIVE
Protein, ur: 30 mg/dL — AB
Specific Gravity, Urine: 1.025 (ref 1.005–1.030)
Urobilinogen, UA: 2 mg/dL — ABNORMAL HIGH (ref 0.0–1.0)
pH: 6 (ref 5.0–8.0)

## 2015-05-28 MED ORDER — CONCEPT OB 130-92.4-1 MG PO CAPS: 1.0000 | ORAL_CAPSULE | Freq: Every day | ORAL | Status: DC

## 2015-05-28 NOTE — Progress Notes (Signed)
Patient declined flu

## 2015-05-28 NOTE — Progress Notes (Signed)
   Subjective:    Tonya Walls is a G1P0 5032w0d by 5.6 week US being seen today for her first obstetrical visit.  Her obstetrical history is significant for obesity. Patient does intend to breast feed. Pregnancy history fully reviewed.  Patient reports no bleeding, no cramping and Increased vaginal discharge. No odor or itching. Ceasar Mons.  Filed Vitals:   05/28/15 0847  BP: 133/66  Pulse: 103  Temp: 98.8 F (37.1 C)  Weight: 279 lb 6.4 oz (126.735 kg)    HISTORY: OB History  Gravida Para Term Preterm AB SAB TAB Ectopic Multiple Living  1             # Outcome Date GA Lbr Len/2nd Weight Sex Delivery Anes PTL Lv  1 Current              History reviewed. No pertinent past medical history. History reviewed. No pertinent past surgical history. Family History  Problem Relation Age of Onset  . Diabetes Father      Exam    Uterus:     Pelvic Exam:    Perineum: No Hemorrhoids, Normal Perineum   Vulva: normal, Bartholin's, Urethra, Skene's normal   Vagina:  normal mucosa, normal discharge   pH: NA   Cervix: no bleeding following Pap, no cervical motion tenderness and nulliparous appearance   Adnexa: normal adnexa and no mass, fullness, tenderness   Bony Pelvis: average  System: Breast:  normal appearance, no masses or tenderness, No nipple retraction or dimpling, No nipple discharge or bleeding, No axillary or supraclavicular adenopathy   Skin: normal coloration and turgor, no rashes    Neurologic: oriented, normal mood, grossly non-focal   Extremities: normal strength, tone, and muscle mass   HEENT sclera clear, anicteric and neck supple with midline trachea   Mouth/Teeth dental hygiene good   Neck supple and no masses   Cardiovascular: regular rate and rhythm   Respiratory:  appears well, vitals normal, no respiratory distress, acyanotic, normal RR, chest clear, no wheezing, crepitations, rhonchi, normal symmetric air entry   Abdomen: soft, non-tender; bowel sounds normal; no  masses,  no organomegaly   Urinary: urethral meatus normal      Assessment:    Pregnancy: G1P0 Patient Active Problem List   Diagnosis Date Noted  . Supervision of other high risk pregnancies, unspecified trimester 05/28/2015  . Bicornuate uterus affecting pregnancy, antepartum 04/15/2015        Plan:     Initial labs drawn. Prenatal vitamins. Problem list reviewed and updated. Genetic Screening discussed First Screen: declined.  Ultrasound discussed; fetal survey: requested.  Follow up in 4 weeks. Declined flu.   Dorathy KinsmanSMITH, Keirstan Iannello 05/28/2015

## 2015-05-28 NOTE — Addendum Note (Signed)
Addended by: Jill SideAY, DIANE L on: 05/28/2015 10:06 AM   Modules accepted: Orders

## 2015-05-28 NOTE — Patient Instructions (Signed)
Round Ligament Pain  The round ligament is a cord of muscle and tissue that helps to support the uterus. It can become a source of pain during pregnancy if it becomes stretched or twisted as the baby grows. The pain usually begins in the second trimester of pregnancy, and it can come and go until the baby is delivered. It is not a serious problem, and it does not cause harm to the baby.  Round ligament pain is usually a short, sharp, and pinching pain, but it can also be a dull, lingering, and aching pain. The pain is felt in the lower side of the abdomen or in the groin. It usually starts deep in the groin and moves up to the outside of the hip area. Pain can occur with:   A sudden change in position.   Rolling over in bed.   Coughing or sneezing.   Physical activity.  HOME CARE INSTRUCTIONS  Watch your condition for any changes. Take these steps to help with your pain:   When the pain starts, relax. Then try:    Sitting down.    Flexing your knees up to your abdomen.    Lying on your side with one pillow under your abdomen and another pillow between your legs.    Sitting in a warm bath for 15-20 minutes or until the pain goes away.   Take over-the-counter and prescription medicines only as told by your health care provider.   Move slowly when you sit and stand.   Avoid long walks if they cause pain.   Stop or lessen your physical activities if they cause pain.  SEEK MEDICAL CARE IF:   Your pain does not go away with treatment.   You feel pain in your back that you did not have before.   Your medicine is not helping.  SEEK IMMEDIATE MEDICAL CARE IF:   You develop a fever or chills.   You develop uterine contractions.   You develop vaginal bleeding.   You develop nausea or vomiting.   You develop diarrhea.   You have pain when you urinate.     This information is not intended to replace advice given to you by your health care provider. Make sure you discuss any questions you have with your health  care provider.     Document Released: 12/09/2007 Document Revised: 05/24/2011 Document Reviewed: 05/08/2014  Elsevier Interactive Patient Education 2016 Elsevier Inc.

## 2015-05-28 NOTE — Progress Notes (Signed)
Bedside US for viability = Single IUP,  FHR - 158 bpm per PW doppler, FM present. Undecided about flu vac.

## 2015-05-29 LAB — GLUCOSE TOLERANCE, 1 HOUR (50G) W/O FASTING: Glucose, 1 Hr, gestational: 96 mg/dL (ref <140)

## 2015-05-29 LAB — WET PREP, GENITAL
TRICH WET PREP: NONE SEEN
Yeast Wet Prep HPF POC: NONE SEEN

## 2015-05-30 LAB — PRENATAL PROFILE (SOLSTAS)
ANTIBODY SCREEN: NEGATIVE
BASOS PCT: 0 % (ref 0–1)
Basophils Absolute: 0 10*3/uL (ref 0.0–0.1)
Eosinophils Absolute: 0.1 10*3/uL (ref 0.0–0.7)
Eosinophils Relative: 1 % (ref 0–5)
HEMATOCRIT: 31.3 % — AB (ref 36.0–46.0)
HEMOGLOBIN: 10.6 g/dL — AB (ref 12.0–15.0)
HEP B S AG: NEGATIVE
HIV 1&2 Ab, 4th Generation: NONREACTIVE
Lymphocytes Relative: 27 % (ref 12–46)
Lymphs Abs: 2 10*3/uL (ref 0.7–4.0)
MCH: 28.6 pg (ref 26.0–34.0)
MCHC: 33.9 g/dL (ref 30.0–36.0)
MCV: 84.6 fL (ref 78.0–100.0)
MONO ABS: 0.6 10*3/uL (ref 0.1–1.0)
MPV: 9.1 fL (ref 8.6–12.4)
Monocytes Relative: 8 % (ref 3–12)
NEUTROS ABS: 4.7 10*3/uL (ref 1.7–7.7)
NEUTROS PCT: 64 % (ref 43–77)
Platelets: 383 10*3/uL (ref 150–400)
RBC: 3.7 MIL/uL — AB (ref 3.87–5.11)
RDW: 15.4 % (ref 11.5–15.5)
Rh Type: POSITIVE
Rubella: 6.88 Index — ABNORMAL HIGH (ref <0.90)
WBC: 7.4 10*3/uL (ref 4.0–10.5)

## 2015-05-30 LAB — PRESCRIPTION MONITORING PROFILE (19 PANEL)
Amphetamine/Meth: NEGATIVE ng/mL
BARBITURATE SCREEN, URINE: NEGATIVE ng/mL
BENZODIAZEPINE SCREEN, URINE: NEGATIVE ng/mL
BUPRENORPHINE, URINE: NEGATIVE ng/mL
Cannabinoid Scrn, Ur: NEGATIVE ng/mL
Carisoprodol, Urine: NEGATIVE ng/mL
Cocaine Metabolites: NEGATIVE ng/mL
Creatinine, Urine: 293.6 mg/dL (ref >20.0)
ECSTASY: NEGATIVE ng/mL
Fentanyl, Ur: NEGATIVE ng/mL
METHAQUALONE SCREEN (URINE): NEGATIVE ng/mL
Meperidine, Ur: NEGATIVE ng/mL
Methadone Screen, Urine: NEGATIVE ng/mL
NITRITES URINE, INITIAL: NEGATIVE ug/mL
OPIATE SCREEN, URINE: NEGATIVE ng/mL
Oxycodone Screen, Ur: NEGATIVE ng/mL
PROPOXYPHENE: NEGATIVE ng/mL
Phencyclidine, Ur: NEGATIVE ng/mL
TRAMADOL UR: NEGATIVE ng/mL
Tapentadol, urine: NEGATIVE ng/mL
ZOLPIDEM, URINE: NEGATIVE ng/mL
pH, Initial: 6.1 pH (ref 4.5–8.9)

## 2015-05-30 LAB — CULTURE, OB URINE
Colony Count: NO GROWTH
Organism ID, Bacteria: NO GROWTH

## 2015-05-30 LAB — CYTOLOGY - PAP

## 2015-06-25 ENCOUNTER — Ambulatory Visit (INDEPENDENT_AMBULATORY_CARE_PROVIDER_SITE_OTHER): Payer: Medicaid Other | Admitting: Advanced Practice Midwife

## 2015-06-25 VITALS — BP 121/75 | HR 102 | Temp 98.2°F | Wt 278.6 lb

## 2015-06-25 DIAGNOSIS — O09892 Supervision of other high risk pregnancies, second trimester: Secondary | ICD-10-CM

## 2015-06-25 DIAGNOSIS — O09899 Supervision of other high risk pregnancies, unspecified trimester: Secondary | ICD-10-CM

## 2015-06-25 DIAGNOSIS — Z3689 Encounter for other specified antenatal screening: Secondary | ICD-10-CM

## 2015-06-25 LAB — POCT URINALYSIS DIP (DEVICE)
Bilirubin Urine: NEGATIVE
GLUCOSE, UA: NEGATIVE mg/dL
Hgb urine dipstick: NEGATIVE
Ketones, ur: NEGATIVE mg/dL
Nitrite: NEGATIVE
PROTEIN: 30 mg/dL — AB
UROBILINOGEN UA: 1 mg/dL (ref 0.0–1.0)
pH: 6 (ref 5.0–8.0)

## 2015-06-25 NOTE — Progress Notes (Signed)
Educated pt on Benefits of Breastfeeding for Baby Edema- feet   Pain- "cramping"  Pt declines Quad Screen at this time; "would like time to think about it" Anatomy US scheduled for May 3 @ 0815.  Pt notified.

## 2015-06-25 NOTE — Patient Instructions (Signed)

## 2015-07-07 ENCOUNTER — Encounter (HOSPITAL_COMMUNITY): Payer: Self-pay | Admitting: Advanced Practice Midwife

## 2015-07-16 ENCOUNTER — Other Ambulatory Visit: Payer: Self-pay | Admitting: Advanced Practice Midwife

## 2015-07-16 ENCOUNTER — Ambulatory Visit (HOSPITAL_COMMUNITY)
Admission: RE | Admit: 2015-07-16 | Discharge: 2015-07-16 | Disposition: A | Discharge status: Home / Self Care | Payer: Medicaid Other | Source: Ambulatory Visit | Attending: Advanced Practice Midwife | Admitting: Advanced Practice Midwife

## 2015-07-16 DIAGNOSIS — Z3689 Encounter for other specified antenatal screening: Secondary | ICD-10-CM

## 2015-07-16 DIAGNOSIS — Z36 Encounter for antenatal screening of mother: Secondary | ICD-10-CM | POA: Insufficient documentation

## 2015-07-16 DIAGNOSIS — Z3A18 18 weeks gestation of pregnancy: Secondary | ICD-10-CM | POA: Diagnosis not present

## 2015-07-16 DIAGNOSIS — O99212 Obesity complicating pregnancy, second trimester: Secondary | ICD-10-CM | POA: Diagnosis not present

## 2015-07-16 DIAGNOSIS — O09899 Supervision of other high risk pregnancies, unspecified trimester: Secondary | ICD-10-CM

## 2015-07-23 ENCOUNTER — Encounter: Payer: Medicaid Other | Admitting: Advanced Practice Midwife

## 2015-07-28 ENCOUNTER — Inpatient Hospital Stay (HOSPITAL_COMMUNITY)
Admission: AD | Admit: 2015-07-28 | Discharge: 2015-07-28 | Disposition: A | Discharge status: Home / Self Care | Payer: Medicaid Other | Source: Ambulatory Visit | Attending: Obstetrics | Admitting: Obstetrics

## 2015-07-28 ENCOUNTER — Encounter (HOSPITAL_COMMUNITY): Payer: Self-pay | Admitting: *Deleted

## 2015-07-28 DIAGNOSIS — O99012 Anemia complicating pregnancy, second trimester: Secondary | ICD-10-CM | POA: Diagnosis not present

## 2015-07-28 DIAGNOSIS — Z87891 Personal history of nicotine dependence: Secondary | ICD-10-CM | POA: Insufficient documentation

## 2015-07-28 DIAGNOSIS — D649 Anemia, unspecified: Secondary | ICD-10-CM | POA: Diagnosis not present

## 2015-07-28 DIAGNOSIS — R1010 Upper abdominal pain, unspecified: Secondary | ICD-10-CM | POA: Diagnosis not present

## 2015-07-28 DIAGNOSIS — Z3A21 21 weeks gestation of pregnancy: Secondary | ICD-10-CM

## 2015-07-28 DIAGNOSIS — O99612 Diseases of the digestive system complicating pregnancy, second trimester: Secondary | ICD-10-CM | POA: Diagnosis not present

## 2015-07-28 DIAGNOSIS — Z3A20 20 weeks gestation of pregnancy: Secondary | ICD-10-CM | POA: Diagnosis not present

## 2015-07-28 DIAGNOSIS — K219 Gastro-esophageal reflux disease without esophagitis: Secondary | ICD-10-CM | POA: Diagnosis not present

## 2015-07-28 DIAGNOSIS — O9989 Other specified diseases and conditions complicating pregnancy, childbirth and the puerperium: Secondary | ICD-10-CM

## 2015-07-28 DIAGNOSIS — O99019 Anemia complicating pregnancy, unspecified trimester: Secondary | ICD-10-CM

## 2015-07-28 DIAGNOSIS — R109 Unspecified abdominal pain: Secondary | ICD-10-CM | POA: Diagnosis present

## 2015-07-28 LAB — COMPREHENSIVE METABOLIC PANEL
ALK PHOS: 62 U/L (ref 38–126)
ALT: 19 U/L (ref 14–54)
AST: 16 U/L (ref 15–41)
Albumin: 3 g/dL — ABNORMAL LOW (ref 3.5–5.0)
Anion gap: 7 (ref 5–15)
BUN: 13 mg/dL (ref 6–20)
CALCIUM: 8.7 mg/dL — AB (ref 8.9–10.3)
CO2: 21 mmol/L — ABNORMAL LOW (ref 22–32)
CREATININE: 0.39 mg/dL — AB (ref 0.44–1.00)
Chloride: 107 mmol/L (ref 101–111)
Glucose, Bld: 101 mg/dL — ABNORMAL HIGH (ref 65–99)
Potassium: 3.6 mmol/L (ref 3.5–5.1)
Sodium: 135 mmol/L (ref 135–145)
Total Bilirubin: 0.6 mg/dL (ref 0.3–1.2)
Total Protein: 6.7 g/dL (ref 6.5–8.1)

## 2015-07-28 LAB — URINALYSIS, ROUTINE W REFLEX MICROSCOPIC
Bilirubin Urine: NEGATIVE
GLUCOSE, UA: NEGATIVE mg/dL
Hgb urine dipstick: NEGATIVE
KETONES UR: NEGATIVE mg/dL
Nitrite: NEGATIVE
PROTEIN: NEGATIVE mg/dL
Specific Gravity, Urine: 1.025 (ref 1.005–1.030)
pH: 6 (ref 5.0–8.0)

## 2015-07-28 LAB — CBC
HCT: 29.9 % — ABNORMAL LOW (ref 36.0–46.0)
HEMOGLOBIN: 9.9 g/dL — AB (ref 12.0–15.0)
MCH: 28.7 pg (ref 26.0–34.0)
MCHC: 33.1 g/dL (ref 30.0–36.0)
MCV: 86.7 fL (ref 78.0–100.0)
Platelets: 305 10*3/uL (ref 150–400)
RBC: 3.45 MIL/uL — ABNORMAL LOW (ref 3.87–5.11)
RDW: 14.3 % (ref 11.5–15.5)
WBC: 8.5 10*3/uL (ref 4.0–10.5)

## 2015-07-28 LAB — AMYLASE: AMYLASE: 32 U/L (ref 28–100)

## 2015-07-28 LAB — LIPASE, BLOOD: LIPASE: 15 U/L (ref 11–51)

## 2015-07-28 LAB — URINE MICROSCOPIC-ADD ON

## 2015-07-28 MED ORDER — ONDANSETRON 4 MG PO TBDP: 4.0000 mg | ORAL_TABLET | Freq: Three times a day (TID) | ORAL | Status: DC | PRN

## 2015-07-28 MED ORDER — GI COCKTAIL ~~LOC~~
30.0000 mL | Freq: Once | ORAL | Status: AC
  Administered 2015-07-28: 30 mL via ORAL
  Filled 2015-07-28: qty 30

## 2015-07-28 MED ORDER — ONDANSETRON 8 MG PO TBDP
8.0000 mg | ORAL_TABLET | Freq: Once | ORAL | Status: AC
  Administered 2015-07-28: 8 mg via ORAL
  Filled 2015-07-28: qty 1

## 2015-07-28 MED ORDER — PANTOPRAZOLE SODIUM 40 MG PO TBEC: 40.0000 mg | DELAYED_RELEASE_TABLET | Freq: Every day | ORAL | Status: DC

## 2015-07-28 MED ORDER — FAMOTIDINE 20 MG PO TABS
40.0000 mg | ORAL_TABLET | Freq: Once | ORAL | Status: AC
  Administered 2015-07-28: 40 mg via ORAL
  Filled 2015-07-28: qty 2

## 2015-07-28 MED ORDER — FERROUS SULFATE 325 (65 FE) MG PO TABS: 325.0000 mg | ORAL_TABLET | Freq: Two times a day (BID) | ORAL | Status: DC

## 2015-07-28 NOTE — MAU Note (Signed)
Patient states she started having upper abdominal pain and vomiting that started today.  Denies vaginal bleeding, LOF, or diarrhea.

## 2015-07-28 NOTE — MAU Provider Note (Signed)
History     CSN: [id number]  Arrival date and time: 07/28/15 0745   First Provider Initiated Contact with Patient 07/28/15 [phone]      Chief Complaint  Patient presents with  . Abdominal Pain  . Emesis   HPI   Tonya Walls is a 25 y.o. female G1P0 @ 19w5dpresenting to MAU with stomach pain. The pain is located in both sides of her abdomen and her upper abdomen. The pain started this morning. She had oatmeal for breakfast, last night for dinner she had a sandwhich. She also ate a lemon and is wondering if this may have contributed to the pain. She has had this pain before however it was not this bad. She is not taking anything for GERD at this time.    She has vomited at least 5 times this morning and the pain worsened after she vomited. She denies issues with N/V in this pregnancy other than first trimester N/V.   The pain is sharp in nature. Nothing makes it better or worse. The pain does not radiate. It starts at the top, in the center of her abdomen and is sharp to the top of her belly button.   OB History    Gravida Para Term Preterm AB TAB SAB Ectopic Multiple Living   1               History reviewed. No pertinent past medical history.  Past Surgical History  Procedure Laterality Date  . No past surgeries      Family History  Problem Relation Age of Onset  . Diabetes Father     Social History  Substance Use Topics  . Smoking status: Former Smoker    Quit date: 11/13/2012  . Smokeless tobacco: Never Used  . Alcohol Use: No    Allergies: No Known Allergies  Prescriptions prior to admission  Medication Sig Dispense Refill Last Dose  . Prenat w/o A Vit-FeFum-FePo-FA (CONCEPT OB) 130-92.4-1 MG CAPS Take 1 tablet by mouth daily. 30 capsule 12 07/28/2015 at Unknown time   Results for orders placed or performed during the hospital encounter of 07/28/15 (from the past 48 hour(s))  [facility]inalysis, [facility]     Status: Abnormal   Collection Time: 07/28/15  8:11 AM  Result Value Ref Range   Color, Urine YELLOW YELLOW   APPearance CLEAR CLEAR   Specific Gravity, Urine 1.025 1.005 - 1.030   pH 6.0 5.0 - 8.0   Glucose, UA NEGATIVE NEGATIVE mg/dL   Hgb urine dipstick NEGATIVE NEGATIVE   Bilirubin Urine NEGATIVE NEGATIVE   Ketones, ur NEGATIVE NEGATIVE mg/dL   Protein, ur NEGATIVE NEGATIVE mg/dL   Nitrite NEGATIVE NEGATIVE   Leukocytes, UA TRACE (A) NEGATIVE  Urine microscopic-add on     Status: Abnormal   Collection Time: 07/28/15  8:11 AM  Result Value Ref Range   Squamous Epithelial / LPF 6-30 (A) NONE SEEN   WBC, UA 0-5 0 - 5 WBC/hpf   RBC / HPF 0-5 0 - 5 RBC/hpf   Bacteria, UA MANY (A) NONE SEEN   Urine-Other MUCOUS PRESENT   CBC     Status: Abnormal   Collection Time: 07/28/15  8:59 AM  Result Value Ref Range   WBC 8.5 4.0 - 10.5 K/uL   RBC 3.45 (L) 3.87 - 5.11 MIL/uL   Hemoglobin 9.9 (L) 12.0 - 15.0 g/dL   HCT 29.9 (L) 36.0 - 46.0 %   MCV 86.7 78.0 -  100.0 fL   MCH 28.7 26.0 - 34.0 pg   MCHC 33.1 30.0 - 36.0 g/dL   RDW 14.3 11.5 - 15.5 %   Platelets 305 150 - 400 K/uL  Comprehensive metabolic panel     Status: Abnormal   Collection Time: 07/28/15  8:59 AM  Result Value Ref Range   Sodium 135 135 - 145 mmol/L   Potassium 3.6 3.5 - 5.1 mmol/L   Chloride 107 101 - 111 mmol/L   CO2 21 (L) 22 - 32 mmol/L   Glucose, Bld 101 (H) 65 - 99 mg/dL   BUN 13 6 - 20 mg/dL   Creatinine, Ser 0.39 (L) 0.44 - 1.00 mg/dL   Calcium 8.7 (L) 8.9 - 10.3 mg/dL   Total Protein 6.7 6.5 - 8.1 g/dL   Albumin 3.0 (L) 3.5 - 5.0 g/dL   AST 16 15 - 41 U/L   ALT 19 14 - 54 U/L   Alkaline Phosphatase 62 38 - 126 U/L   Total Bilirubin 0.6 0.3 - 1.2 mg/dL   GFR calc non Af Amer >60 >60 mL/min   GFR calc Af Amer >60 >60 mL/min    Comment: (NOTE) The eGFR has been calculated using the CKD EPI equation. This calculation has not been validated in all clinical situations. eGFR's persistently <60 mL/min signify possible Chronic  Kidney Disease.    Anion gap 7 5 - 15  Amylase     Status: None   Collection Time: 07/28/15  8:59 AM  Result Value Ref Range   Amylase 32 28 - 100 U/L  Lipase, blood     Status: None   Collection Time: 07/28/15  8:59 AM  Result Value Ref Range   Lipase 15 11 - 51 U/L    Review of Systems  Constitutional: Negative for fever.  Eyes: Negative for blurred vision.  Respiratory: Negative for shortness of breath.   Cardiovascular: Negative for chest pain.  Gastrointestinal: Positive for heartburn, nausea, vomiting and abdominal pain. Negative for diarrhea and constipation.  Genitourinary: Negative for dysuria.  Neurological: Negative for headaches.   Physical Exam   Blood pressure 115/58, pulse 100, temperature 98.1 F (36.7 C), temperature source Oral, resp. rate 18, height '5\' 2"'$  (1.575 m), weight 279 lb 12.8 oz (126.916 kg), last menstrual period 03/06/2015, SpO2 96 %.  Physical Exam  Constitutional: She is oriented to person, place, and time. She appears well-developed and well-nourished. No distress.  HENT:  Head: Normocephalic.  Eyes: Pupils are equal, round, and reactive to light.  Respiratory: Effort normal.  GI: Soft. Normal appearance. There is tenderness in the epigastric area. There is no rigidity, no rebound and no guarding.    Genitourinary:  Dilation: Closed Effacement (%): Thick Cervical Position: Posterior Exam by:: J.Javious Hallisey,NP  Musculoskeletal: Normal range of motion.  Neurological: She is alert and oriented to person, place, and time.  Skin: Skin is warm. She is not diaphoretic.  Psychiatric: Her behavior is normal.    MAU Course  Procedures  None  MDM  + fetal heart tones Zofran 4 mg ODT Pepcid 40 mg  Patient feels the pain has improved following medications. No vomiting noted while in MAU Gi cocktail prior to DC.   Assessment and Plan   A:  1. Upper abdominal pain   2. Gastroesophageal reflux disease, esophagitis presence not specified     3. Anemia affecting pregnancy     P:  Discharge home in stable condition  Right upper Quad [place] ordered and scheduled  for 5/17 @ 0830- patient to be NPO prior to scan Low fat diet encouraged. No fried foods, no citrus foods Return to MAU if symptoms worsen Rx: Protonix        Iron BID Follow up with Dr. Truddie Hidden I Saharra Santo, NP 07/28/2015 1:19 PM

## 2015-07-28 NOTE — Discharge Instructions (Signed)
Food Choices for Gastroesophageal Reflux Disease, Adult When you have gastroesophageal reflux disease (GERD), the foods you eat and your eating habits are very important. Choosing the right foods can help ease the discomfort of GERD. WHAT GENERAL GUIDELINES DO I NEED TO FOLLOW?  Choose fruits, vegetables, whole grains, low-fat dairy products, and low-fat meat, fish, and poultry.  Limit fats such as oils, salad dressings, butter, nuts, and avocado.  Keep a food diary to identify foods that cause symptoms.  Avoid foods that cause reflux. These may be different for different people.  Eat frequent small meals instead of three large meals each day.  Eat your meals slowly, in a relaxed setting.  Limit fried foods.  Cook foods using methods other than frying.  Avoid drinking alcohol.  Avoid drinking large amounts of liquids with your meals.  Avoid bending over or lying down until 2-3 hours after eating. WHAT FOODS ARE NOT RECOMMENDED? The following are some foods and drinks that may worsen your symptoms: Vegetables Tomatoes. Tomato juice. Tomato and spaghetti sauce. Chili peppers. Onion and garlic. Horseradish. Fruits Oranges, grapefruit, and lemon (fruit and juice). Meats High-fat meats, fish, and poultry. This includes hot dogs, ribs, ham, sausage, salami, and bacon. Dairy Whole milk and chocolate milk. Sour cream. Cream. Butter. Ice cream. Cream cheese.  Beverages Coffee and tea, with or without caffeine. Carbonated beverages or energy drinks. Condiments Hot sauce. Barbecue sauce.  Sweets/Desserts Chocolate and cocoa. Donuts. Peppermint and spearmint. Fats and Oils High-fat foods, including French fries and potato chips. Other Vinegar. Strong spices, such as black pepper, white pepper, red pepper, cayenne, curry powder, cloves, ginger, and chili powder. The items listed above may not be a complete list of foods and beverages to avoid. Contact your dietitian for more  information.   This information is not intended to replace advice given to you by your health care provider. Make sure you discuss any questions you have with your health care provider.   Document Released: 03/01/2005 Document Revised: 03/22/2014 Document Reviewed: 01/03/2013 Elsevier Interactive Patient Education 2016 Elsevier Inc.  

## 2015-07-29 LAB — CULTURE, OB URINE: Special Requests: NORMAL

## 2015-07-30 ENCOUNTER — Ambulatory Visit (HOSPITAL_COMMUNITY): Admit: 2015-07-30 | Payer: Medicaid Other

## 2015-08-06 ENCOUNTER — Ambulatory Visit (INDEPENDENT_AMBULATORY_CARE_PROVIDER_SITE_OTHER): Payer: Medicaid Other | Admitting: Certified Nurse Midwife

## 2015-08-06 VITALS — BP 107/76 | HR 95 | Wt 280.8 lb

## 2015-08-06 DIAGNOSIS — Z3482 Encounter for supervision of other normal pregnancy, second trimester: Secondary | ICD-10-CM | POA: Diagnosis present

## 2015-08-06 DIAGNOSIS — O09899 Supervision of other high risk pregnancies, unspecified trimester: Secondary | ICD-10-CM

## 2015-08-06 LAB — POCT URINALYSIS DIP (DEVICE)
GLUCOSE, UA: NEGATIVE mg/dL
Hgb urine dipstick: NEGATIVE
Nitrite: NEGATIVE
PROTEIN: 30 mg/dL — AB
SPECIFIC GRAVITY, URINE: 1.025 (ref 1.005–1.030)
Urobilinogen, UA: 1 mg/dL (ref 0.0–1.0)
pH: 6.5 (ref 5.0–8.0)

## 2015-08-06 NOTE — Progress Notes (Signed)
Subjective:  Tonya Walls is a 25 y.o. G1P0 at 3142w0d being seen today for ongoing prenatal care.  She is currently monitored for the following issues for this low-risk pregnancy and has Bicornuate uterus affecting pregnancy, antepartum and Supervision of other high risk pregnancies, unspecified trimester on her problem list.  Patient reports no complaints.  Contractions: Not present. Vag. Bleeding: None.  Movement: Present. Denies leaking of fluid.   The following portions of the patient's history were reviewed and updated as appropriate: allergies, current medications, past family history, past medical history, past social history, past surgical history and problem list. Problem list updated.  Objective:   Filed Vitals:   08/06/15 1104  BP: 107/76  Pulse: 95  Weight: 280 lb 12.8 oz (127.37 kg)    Fetal Status: Fetal Heart Rate (bpm): 145   Movement: Present     General:  Alert, oriented and cooperative. Patient is in no acute distress.  Skin: Skin is warm and dry. No rash noted.   Cardiovascular: Normal heart rate noted  Respiratory: Normal respiratory effort, no problems with respiration noted  Abdomen: Soft, gravid, appropriate for gestational age. Pain/Pressure: Present     Pelvic: Vag. Bleeding: None Vag D/C Character: Watery   Cervical exam deferred        Extremities: Normal range of motion.  Edema: None  Mental Status: Normal mood and affect. Normal behavior. Normal judgment and thought content.   Urinalysis:      Assessment and Plan:  Pregnancy: G1P0 at 6742w0d  1. Supervision of other high risk pregnancies, unspecified trimester  - US MFM OB FOLLOW UP; Future  Preterm labor symptoms and general obstetric precautions including but not limited to vaginal bleeding, contractions, leaking of fluid and fetal movement were reviewed in detail with the patient. Please refer to After Visit Summary for other counseling recommendations.  Return in about 4 weeks (around  09/03/2015).   Rhea PinkLori A Clemmons, CNM

## 2015-08-06 NOTE — Progress Notes (Signed)
Need u/a Breastfeeding tip reviewed

## 2015-08-06 NOTE — Patient Instructions (Signed)

## 2015-08-13 ENCOUNTER — Ambulatory Visit (HOSPITAL_COMMUNITY)
Admission: RE | Admit: 2015-08-13 | Discharge: 2015-08-13 | Disposition: A | Discharge status: Home / Self Care | Payer: Medicaid Other | Source: Ambulatory Visit | Attending: Certified Nurse Midwife | Admitting: Certified Nurse Midwife

## 2015-08-13 DIAGNOSIS — Z3A22 22 weeks gestation of pregnancy: Secondary | ICD-10-CM | POA: Insufficient documentation

## 2015-08-13 DIAGNOSIS — Z36 Encounter for antenatal screening of mother: Secondary | ICD-10-CM | POA: Diagnosis present

## 2015-08-13 DIAGNOSIS — O09899 Supervision of other high risk pregnancies, unspecified trimester: Secondary | ICD-10-CM

## 2015-09-22 ENCOUNTER — Ambulatory Visit (INDEPENDENT_AMBULATORY_CARE_PROVIDER_SITE_OTHER): Payer: Medicaid Other | Admitting: Obstetrics & Gynecology

## 2015-09-22 VITALS — BP 131/86 | HR 111 | Wt 284.4 lb

## 2015-09-22 DIAGNOSIS — O0992 Supervision of high risk pregnancy, unspecified, second trimester: Secondary | ICD-10-CM | POA: Diagnosis present

## 2015-09-22 DIAGNOSIS — O09899 Supervision of other high risk pregnancies, unspecified trimester: Secondary | ICD-10-CM

## 2015-09-22 LAB — CBC
HCT: 32.9 % — ABNORMAL LOW (ref 35.0–45.0)
HEMOGLOBIN: 11 g/dL — AB (ref 11.7–15.5)
MCH: 29.2 pg (ref 27.0–33.0)
MCHC: 33.4 g/dL (ref 32.0–36.0)
MCV: 87.3 fL (ref 80.0–100.0)
MPV: 9.5 fL (ref 7.5–12.5)
PLATELETS: 340 10*3/uL (ref 140–400)
RBC: 3.77 MIL/uL — AB (ref 3.80–5.10)
RDW: 13.9 % (ref 11.0–15.0)
WBC: 9.3 10*3/uL (ref 3.8–10.8)

## 2015-09-22 LAB — POCT URINALYSIS DIP (DEVICE)
Glucose, UA: NEGATIVE mg/dL
HGB URINE DIPSTICK: NEGATIVE
KETONES UR: 15 mg/dL — AB
Nitrite: NEGATIVE
PH: 7 (ref 5.0–8.0)
PROTEIN: NEGATIVE mg/dL
SPECIFIC GRAVITY, URINE: 1.02 (ref 1.005–1.030)
Urobilinogen, UA: 0.2 mg/dL (ref 0.0–1.0)

## 2015-09-22 LAB — GLUCOSE TOLERANCE, 1 HOUR (50G) W/O FASTING

## 2015-09-22 NOTE — Patient Instructions (Signed)

## 2015-09-22 NOTE — Progress Notes (Signed)
Subjective:needs US f/u  Tonya Walls is a 25 y.o. G1P0 at 2617w5d being seen today for ongoing prenatal care.  She is currently monitored for the following issues for this high-risk pregnancy and has Bicornuate uterus affecting pregnancy, antepartum and Supervision of other high risk pregnancies, unspecified trimester on her problem list.  Patient reports no complaints.  Contractions: Not present. Vag. Bleeding: None.  Movement: Present. Denies leaking of fluid.   The following portions of the patient's history were reviewed and updated as appropriate: allergies, current medications, past family history, past medical history, past social history, past surgical history and problem list. Problem list updated.  Objective:   Filed Vitals:   09/22/15 0900  BP: 131/86  Pulse: 111  Weight: 284 lb 6.4 oz (129.003 kg)    Fetal Status:     Movement: Present     General:  Alert, oriented and cooperative. Patient is in no acute distress.  Skin: Skin is warm and dry. No rash noted.   Cardiovascular: Normal heart rate noted  Respiratory: Normal respiratory effort, no problems with respiration noted  Abdomen: Soft, gravid, appropriate for gestational age. Pain/Pressure: Absent     Pelvic:  Cervical exam deferred        Extremities: Normal range of motion.  Edema: None  Mental Status: Normal mood and affect. Normal behavior. Normal judgment and thought content.   Urinalysis: Urine Protein: Negative Urine Glucose: Negative  Assessment and Plan:  Pregnancy: G1P0 at 4517w5d  1. Supervision of high risk pregnancy in second trimester Routine screen today - Glucose Tolerance, 1 HR (50g) w/o Fasting - HIV antibody (with reflex) - CBC - RPR - US MFM OB FOLLOW UP; Future  2. Supervision of other high risk pregnancies, unspecified trimester US f/u MFM  Preterm labor symptoms and general obstetric precautions including but not limited to vaginal bleeding, contractions, leaking of fluid and fetal  movement were reviewed in detail with the patient. Please refer to After Visit Summary for other counseling recommendations.  Return in about 2 weeks (around 10/06/2015).   Adam PhenixJames G Akhilesh Sassone, MD

## 2015-09-22 NOTE — Progress Notes (Signed)
Urine, small amt biliribin, 15 ketones, trace wbcs Tdap deferred to next visit

## 2015-09-23 LAB — HIV ANTIBODY (ROUTINE TESTING W REFLEX): HIV 1&2 Ab, 4th Generation: NONREACTIVE

## 2015-09-24 ENCOUNTER — Other Ambulatory Visit: Payer: Self-pay | Admitting: Obstetrics & Gynecology

## 2015-09-24 ENCOUNTER — Ambulatory Visit (HOSPITAL_COMMUNITY)
Admission: RE | Admit: 2015-09-24 | Discharge: 2015-09-24 | Disposition: A | Discharge status: Home / Self Care | Payer: Medicaid Other | Source: Ambulatory Visit | Attending: Obstetrics & Gynecology | Admitting: Obstetrics & Gynecology

## 2015-09-24 ENCOUNTER — Encounter (HOSPITAL_COMMUNITY): Payer: Self-pay

## 2015-09-24 VITALS — BP 116/80 | HR 108 | Wt 288.0 lb

## 2015-09-24 DIAGNOSIS — O99213 Obesity complicating pregnancy, third trimester: Secondary | ICD-10-CM | POA: Insufficient documentation

## 2015-09-24 DIAGNOSIS — Q513 Bicornate uterus: Secondary | ICD-10-CM

## 2015-09-24 DIAGNOSIS — O3400 Maternal care for unspecified congenital malformation of uterus, unspecified trimester: Secondary | ICD-10-CM

## 2015-09-24 DIAGNOSIS — Z3A28 28 weeks gestation of pregnancy: Secondary | ICD-10-CM

## 2015-09-24 DIAGNOSIS — O0992 Supervision of high risk pregnancy, unspecified, second trimester: Secondary | ICD-10-CM

## 2015-09-24 DIAGNOSIS — O3403 Maternal care for unspecified congenital malformation of uterus, third trimester: Secondary | ICD-10-CM

## 2015-09-24 DIAGNOSIS — O34593 Maternal care for other abnormalities of gravid uterus, third trimester: Secondary | ICD-10-CM | POA: Insufficient documentation

## 2015-09-24 DIAGNOSIS — O99210 Obesity complicating pregnancy, unspecified trimester: Secondary | ICD-10-CM

## 2015-09-24 LAB — RPR

## 2015-10-06 ENCOUNTER — Encounter: Payer: Medicaid Other | Admitting: Advanced Practice Midwife

## 2015-10-21 ENCOUNTER — Ambulatory Visit (INDEPENDENT_AMBULATORY_CARE_PROVIDER_SITE_OTHER): Payer: Medicaid Other | Admitting: Family

## 2015-10-21 VITALS — BP 117/70 | HR 111 | Wt 286.1 lb

## 2015-10-21 DIAGNOSIS — Z23 Encounter for immunization: Secondary | ICD-10-CM | POA: Diagnosis not present

## 2015-10-21 DIAGNOSIS — O34593 Maternal care for other abnormalities of gravid uterus, third trimester: Secondary | ICD-10-CM

## 2015-10-21 DIAGNOSIS — O09899 Supervision of other high risk pregnancies, unspecified trimester: Secondary | ICD-10-CM

## 2015-10-21 DIAGNOSIS — O3403 Maternal care for unspecified congenital malformation of uterus, third trimester: Secondary | ICD-10-CM

## 2015-10-21 DIAGNOSIS — Q513 Bicornate uterus: Secondary | ICD-10-CM

## 2015-10-21 LAB — POCT URINALYSIS DIP (DEVICE)
GLUCOSE, UA: NEGATIVE mg/dL
Hgb urine dipstick: NEGATIVE
Nitrite: NEGATIVE
PROTEIN: 30 mg/dL — AB
SPECIFIC GRAVITY, URINE: 1.02 (ref 1.005–1.030)
Urobilinogen, UA: 1 mg/dL (ref 0.0–1.0)
pH: 7 (ref 5.0–8.0)

## 2015-10-21 MED ORDER — TETANUS-DIPHTH-ACELL PERTUSSIS 5-2.5-18.5 LF-MCG/0.5 IM SUSP
0.5000 mL | Freq: Once | INTRAMUSCULAR | Status: AC
  Administered 2015-10-21: 0.5 mL via INTRAMUSCULAR

## 2015-10-21 NOTE — Progress Notes (Signed)
Subjective:  Tonya Walls is a 25 y.o. G1P0 at 247w6d being seen today for ongoing prenatal care.  She is currently monitored for the following issues for this high-risk pregnancy and has Bicornuate uterus affecting pregnancy, antepartum and Supervision of other high risk pregnancies, unspecified trimester on her problem list.  Patient reports no complaints.  Contractions: Not present. Vag. Bleeding: None.  Movement: Present. Denies leaking of fluid.   The following portions of the patient's history were reviewed and updated as appropriate: allergies, current medications, past family history, past medical history, past social history, past surgical history and problem list. Problem list updated.  Objective:   Vitals:   10/21/15 0852  BP: 117/70  Pulse: (!) 111  Weight: 286 lb 1.6 oz (129.8 kg)    Fetal Status: Fetal Heart Rate (bpm): 152 Fundal Height: 34 cm Movement: Present     General:  Alert, oriented and cooperative. Patient is in no acute distress.  Skin: Skin is warm and dry. No rash noted.   Cardiovascular: Normal heart rate noted  Respiratory: Normal respiratory effort, no problems with respiration noted  Abdomen: Soft, gravid, appropriate for gestational age. Pain/Pressure: Present     Pelvic:  Cervical exam deferred        Extremities: Normal range of motion.  Edema: None  Mental Status: Normal mood and affect. Normal behavior. Normal judgment and thought content.   Urinalysis: Urine Protein: 1+ Urine Glucose: Negative  Assessment and Plan:  Pregnancy: G1P0 at 377w6d  1. Need for Tdap vaccination - Tdap (BOOSTRIX) injection 0.5 mL; Inject 0.5 mLs into the muscle once.  2. Supervision of other high risk pregnancies, unspecified trimester - Plans to return for 1 hr tomorrow - Reviewed contraceptive choices   3. Bicornuate uterus affecting pregnancy, antepartum, third trimester - Growth ultrasound 63%ile at 28 wks  Preterm labor symptoms and general obstetric  precautions including but not limited to vaginal bleeding, contractions, leaking of fluid and fetal movement were reviewed in detail with the patient. Please refer to After Visit Summary for other counseling recommendations.  Return in about 2 weeks (around 11/04/2015) for return tomorrow for lab only 1 hr.   Eino FarberWalidah Kennith GainN Karim, CNM

## 2015-10-21 NOTE — Progress Notes (Signed)
-  tdap today

## 2015-10-21 NOTE — Patient Instructions (Signed)
AREA PEDIATRIC/FAMILY PRACTICE PHYSICIANS  [facility] [address] [place], [place] [address] Phone - [phone]   Fax - [phone]  [facility] [address] [place], [place]  [address] Phone - [phone]   Fax - [phone]  [facility] [address] [place], [place]  [address] Phone - [phone]   Fax - [phone]  [facility] [address] [place], [place]  [address] Phone - [phone]   Fax - [phone]  [facility] [address] [place], [place]  [address] Phone - [phone]   Fax - [phone]  [facility] [address], [address] [place], [place]  [address] Phone - [phone]   Fax - [phone]  [facility] [address] [place], [place]  [address] Phone - [phone]   Fax - [phone]  [facility] [address], [address] [place], [place]  [address] Phone - [phone]   Fax - [phone]  [facility] [address] [place], [place]  [address] Phone - [phone]   Fax - [phone] [facility] [address] [place], [place]  [address] Phone - [phone]   Fax - [phone]  [facility] [address] [place], [place]  [address] Phone - [phone]   Fax - [phone]  [facility] [address] [place], [place]  [address] Phone - [phone]   Fax - [phone]  [facility] [address] [place], [place]  [address] Phone - [phone]   Fax - [phone]  [facility] [address] [place], [place]  [address] Phone - [phone]  [facility] [address] [place], [place]  [address] Phone - [phone]   Fax - [phone]  [facility] [address] [place], [place]  [address] Phone - [phone]   Fax - [phone]  [facility] [address] [place], [place]  [address] Phone - [phone]   Fax - [phone]  [facility] [address] [place], [place]  [address] Phone - [phone]   Fax - [phone]   [facility] [address] [place], [place]  [address] Phone - [phone]   Fax - [phone]  [facility] [address] [place], [place]  [address] Phone - [phone]   Fax - [phone]  [facility] [address] [place], [place]  [address] Phone - [phone]   Fax - [phone]  [facility] [address] [place], [place]  [address] Phone - [phone]   Fax - [phone]  [facility] [address] , [place]  [address] Phone - [phone]   Fax - [phone] [facility] [address] [place], [place]  [address] Phone - [phone]   Fax - [phone]  [facility] [address] [place], [place]  [address] Phone - [phone]   Fax - [phone]   [facility] [address] [place], [place]  [address] Phone - [phone]   Fax - [phone]   

## 2015-10-22 ENCOUNTER — Ambulatory Visit (HOSPITAL_COMMUNITY): Admission: RE | Admit: 2015-10-22 | Payer: Medicaid Other | Source: Ambulatory Visit

## 2015-10-22 ENCOUNTER — Other Ambulatory Visit: Payer: Medicaid Other

## 2015-10-23 ENCOUNTER — Ambulatory Visit (HOSPITAL_COMMUNITY)
Admission: RE | Admit: 2015-10-23 | Discharge: 2015-10-23 | Disposition: A | Discharge status: Home / Self Care | Payer: Medicaid Other | Source: Ambulatory Visit | Attending: Obstetrics & Gynecology | Admitting: Obstetrics & Gynecology

## 2015-10-23 ENCOUNTER — Encounter (HOSPITAL_COMMUNITY): Payer: Self-pay

## 2015-10-23 ENCOUNTER — Other Ambulatory Visit (HOSPITAL_COMMUNITY): Payer: Self-pay | Admitting: Maternal and Fetal Medicine

## 2015-10-23 DIAGNOSIS — Z3A33 33 weeks gestation of pregnancy: Secondary | ICD-10-CM

## 2015-10-23 DIAGNOSIS — O34593 Maternal care for other abnormalities of gravid uterus, third trimester: Secondary | ICD-10-CM | POA: Insufficient documentation

## 2015-10-23 DIAGNOSIS — O3400 Maternal care for unspecified congenital malformation of uterus, unspecified trimester: Secondary | ICD-10-CM

## 2015-10-23 DIAGNOSIS — O09899 Supervision of other high risk pregnancies, unspecified trimester: Secondary | ICD-10-CM

## 2015-10-23 DIAGNOSIS — O99213 Obesity complicating pregnancy, third trimester: Secondary | ICD-10-CM | POA: Diagnosis not present

## 2015-10-23 DIAGNOSIS — Q513 Bicornate uterus: Secondary | ICD-10-CM | POA: Diagnosis not present

## 2015-11-04 ENCOUNTER — Encounter: Payer: Medicaid Other | Admitting: Advanced Practice Midwife

## 2015-11-19 ENCOUNTER — Encounter: Payer: Medicaid Other | Admitting: Student

## 2015-11-20 ENCOUNTER — Inpatient Hospital Stay (HOSPITAL_COMMUNITY)
Admission: AD | Admit: 2015-11-20 | Discharge: 2015-11-20 | Disposition: A | Discharge status: Home / Self Care | Payer: Medicaid Other | Source: Ambulatory Visit | Attending: Obstetrics & Gynecology | Admitting: Obstetrics & Gynecology

## 2015-11-20 ENCOUNTER — Encounter (HOSPITAL_COMMUNITY): Payer: Self-pay

## 2015-11-20 DIAGNOSIS — O09899 Supervision of other high risk pregnancies, unspecified trimester: Secondary | ICD-10-CM

## 2015-11-20 DIAGNOSIS — Z3A37 37 weeks gestation of pregnancy: Secondary | ICD-10-CM | POA: Diagnosis not present

## 2015-11-20 NOTE — Discharge Instructions (Signed)
Braxton Hicks Contractions °Contractions of the uterus can occur throughout pregnancy. Contractions are not always a sign that you are in labor.  °WHAT ARE BRAXTON HICKS CONTRACTIONS?  °Contractions that occur before labor are called Braxton Hicks contractions, or false labor. Toward the end of pregnancy (32-34 weeks), these contractions can develop more often and may become more forceful. This is not true labor because these contractions do not result in opening (dilatation) and thinning of the cervix. They are sometimes difficult to tell apart from true labor because these contractions can be forceful and people have different pain tolerances. You should not feel embarrassed if you go to the hospital with false labor. Sometimes, the only way to tell if you are in true labor is for your health care provider to look for changes in the cervix. °If there are no prenatal problems or other health problems associated with the pregnancy, it is completely safe to be sent home with false labor and await the onset of true labor. °HOW CAN YOU TELL THE DIFFERENCE BETWEEN TRUE AND FALSE LABOR? °False Labor °· The contractions of false labor are usually shorter and not as hard as those of true labor.   °· The contractions are usually irregular.   °· The contractions are often felt in the front of the lower abdomen and in the groin.   °· The contractions may go away when you walk around or change positions while lying down.   °· The contractions get weaker and are shorter lasting as time goes on.   °· The contractions do not usually become progressively stronger, regular, and closer together as with true labor.   °True Labor °· Contractions in true labor last 30-70 seconds, become very regular, usually become more intense, and increase in frequency.   °· The contractions do not go away with walking.   °· The discomfort is usually felt in the top of the uterus and spreads to the lower abdomen and low back.   °· True labor can be  determined by your health care provider with an exam. This will show that the cervix is dilating and getting thinner.   °WHAT TO REMEMBER °· Keep up with your usual exercises and follow other instructions given by your health care provider.   °· Take medicines as directed by your health care provider.   °· Keep your regular prenatal appointments.   °· Eat and drink lightly if you think you are going into labor.   °· If Braxton Hicks contractions are making you uncomfortable:   °¨ Change your position from lying down or resting to walking, or from walking to resting.   °¨ Sit and rest in a tub of warm water.   °¨ Drink 2-3 glasses of water. Dehydration may cause these contractions.   °¨ Do slow and deep breathing several times an hour.   °WHEN SHOULD I SEEK IMMEDIATE MEDICAL CARE? °Seek immediate medical care if: °· Your contractions become stronger, more regular, and closer together.   °· You have fluid leaking or gushing from your vagina.   °· You have a fever.   °· You pass blood-tinged mucus.   °· You have vaginal bleeding.   °· You have continuous abdominal pain.   °· You have low back pain that you never had before.   °· You feel your baby's head pushing down and causing pelvic pressure.   °· Your baby is not moving as much as it used to.   °  °This information is not intended to replace advice given to you by your health care provider. Make sure you discuss any questions you have with your health care   provider. °  °Document Released: 03/01/2005 Document Revised: 03/06/2013 Document Reviewed: 12/11/2012 °Elsevier Interactive Patient Education ©2016 [employer] °Fetal Movement Counts °Patient Name: __________________________________________________ Patient Due Date: ____________________ °Performing a fetal movement count is highly recommended in high-risk pregnancies, but it is good for every pregnant woman to do. Your health care provider may ask you to start counting fetal movements at 28 weeks of the  pregnancy. Fetal movements often increase: °· After eating a full meal. °· After physical activity. °· After eating or drinking something sweet or cold. °· At rest. °Pay attention to when you feel the baby is most active. This will help you notice a pattern of your baby's sleep and wake cycles and what factors contribute to an increase in fetal movement. It is important to perform a fetal movement count at the same time each day when your baby is normally most active.  °HOW TO COUNT FETAL MOVEMENTS °1. Find a quiet and comfortable area to sit or lie down on your left side. Lying on your left side provides the best blood and oxygen circulation to your baby. °2. Write down the day and time on a sheet of paper or in a journal. °3. Start counting kicks, flutters, swishes, rolls, or jabs in a 2-hour period. You should feel at least 10 movements within 2 hours. °4. If you do not feel 10 movements in 2 hours, wait 2-3 hours and count again. Look for a change in the pattern or not enough counts in 2 hours. °SEEK MEDICAL CARE IF: °· You feel less than 10 counts in 2 hours, tried twice. °· There is no movement in over an hour. °· The pattern is changing or taking longer each day to reach 10 counts in 2 hours. °· You feel the baby is not moving as he or she usually does. °Date: ____________ Movements: ____________ Start time: ____________ Finish time: ____________  °Date: ____________ Movements: ____________ Start time: ____________ Finish time: ____________ °Date: ____________ Movements: ____________ Start time: ____________ Finish time: ____________ °Date: ____________ Movements: ____________ Start time: ____________ Finish time: ____________ °Date: ____________ Movements: ____________ Start time: ____________ Finish time: ____________ °Date: ____________ Movements: ____________ Start time: ____________ Finish time: ____________ °Date: ____________ Movements: ____________ Start time: ____________ Finish time:  ____________ °Date: ____________ Movements: ____________ Start time: ____________ Finish time: ____________  °Date: ____________ Movements: ____________ Start time: ____________ Finish time: ____________ °Date: ____________ Movements: ____________ Start time: ____________ Finish time: ____________ °Date: ____________ Movements: ____________ Start time: ____________ Finish time: ____________ °Date: ____________ Movements: ____________ Start time: ____________ Finish time: ____________ °Date: ____________ Movements: ____________ Start time: ____________ Finish time: ____________ °Date: ____________ Movements: ____________ Start time: ____________ Finish time: ____________ °Date: ____________ Movements: ____________ Start time: ____________ Finish time: ____________  °Date: ____________ Movements: ____________ Start time: ____________ Finish time: ____________ °Date: ____________ Movements: ____________ Start time: ____________ Finish time: ____________ °Date: ____________ Movements: ____________ Start time: ____________ Finish time: ____________ °Date: ____________ Movements: ____________ Start time: ____________ Finish time: ____________ °Date: ____________ Movements: ____________ Start time: ____________ Finish time: ____________ °Date: ____________ Movements: ____________ Start time: ____________ Finish time: ____________ °Date: ____________ Movements: ____________ Start time: ____________ Finish time: ____________  °Date: ____________ Movements: ____________ Start time: ____________ Finish time: ____________ °Date: ____________ Movements: ____________ Start time: ____________ Finish time: ____________ °Date: ____________ Movements: ____________ Start time: ____________ Finish time: ____________ °Date: ____________ Movements: ____________ Start time: ____________ Finish time: ____________ °Date: ____________ Movements: ____________ Start time: ____________ Finish time: ____________ °Date: ____________ Movements:  ____________ Start time: ____________ Finish time:   ____________ °Date: ____________ Movements: ____________ Start time: ____________ Finish time: ____________  °Date: ____________ Movements: ____________ Start time: ____________ Finish time: ____________ °Date: ____________ Movements: ____________ Start time: ____________ Finish time: ____________ °Date: ____________ Movements: ____________ Start time: ____________ Finish time: ____________ °Date: ____________ Movements: ____________ Start time: ____________ Finish time: ____________ °Date: ____________ Movements: ____________ Start time: ____________ Finish time: ____________ °Date: ____________ Movements: ____________ Start time: ____________ Finish time: ____________ °Date: ____________ Movements: ____________ Start time: ____________ Finish time: ____________  °Date: ____________ Movements: ____________ Start time: ____________ Finish time: ____________ °Date: ____________ Movements: ____________ Start time: ____________ Finish time: ____________ °Date: ____________ Movements: ____________ Start time: ____________ Finish time: ____________ °Date: ____________ Movements: ____________ Start time: ____________ Finish time: ____________ °Date: ____________ Movements: ____________ Start time: ____________ Finish time: ____________ °Date: ____________ Movements: ____________ Start time: ____________ Finish time: ____________ °Date: ____________ Movements: ____________ Start time: ____________ Finish time: ____________  °Date: ____________ Movements: ____________ Start time: ____________ Finish time: ____________ °Date: ____________ Movements: ____________ Start time: ____________ Finish time: ____________ °Date: ____________ Movements: ____________ Start time: ____________ Finish time: ____________ °Date: ____________ Movements: ____________ Start time: ____________ Finish time: ____________ °Date: ____________ Movements: ____________ Start time: ____________ Finish  time: ____________ °Date: ____________ Movements: ____________ Start time: ____________ Finish time: ____________ °Date: ____________ Movements: ____________ Start time: ____________ Finish time: ____________  °Date: ____________ Movements: ____________ Start time: ____________ Finish time: ____________ °Date: ____________ Movements: ____________ Start time: ____________ Finish time: ____________ °Date: ____________ Movements: ____________ Start time: ____________ Finish time: ____________ °Date: ____________ Movements: ____________ Start time: ____________ Finish time: ____________ °Date: ____________ Movements: ____________ Start time: ____________ Finish time: ____________ °Date: ____________ Movements: ____________ Start time: ____________ Finish time: ____________ °  °This information is not intended to replace advice given to you by your health care provider. Make sure you discuss any questions you have with your health care provider. °  °Document Released: 03/31/2006 Document Revised: 03/22/2014 Document Reviewed: 12/27/2011 °Elsevier Interactive Patient Education ©2016 [employer] ° °

## 2015-11-20 NOTE — MAU Note (Signed)
Pt presents complaining of contractions since 10pm that are 5 minutes apart. Denies leaking. Reports some spotting. Reports no fetal movement since yesterday afternoon.

## 2015-11-20 NOTE — MAU Note (Signed)
RN in room to do SVE; pt found sleeping.

## 2015-11-24 ENCOUNTER — Other Ambulatory Visit (HOSPITAL_COMMUNITY)
Admission: RE | Admit: 2015-11-24 | Discharge: 2015-11-24 | Disposition: A | Discharge status: Home / Self Care | Payer: Medicaid Other | Source: Ambulatory Visit | Attending: Obstetrics & Gynecology | Admitting: Obstetrics & Gynecology

## 2015-11-24 ENCOUNTER — Ambulatory Visit (INDEPENDENT_AMBULATORY_CARE_PROVIDER_SITE_OTHER): Payer: Medicaid Other | Admitting: Obstetrics & Gynecology

## 2015-11-24 VITALS — BP 117/73 | HR 84 | Wt 291.6 lb

## 2015-11-24 DIAGNOSIS — Z113 Encounter for screening for infections with a predominantly sexual mode of transmission: Secondary | ICD-10-CM

## 2015-11-24 DIAGNOSIS — O3663X0 Maternal care for excessive fetal growth, third trimester, not applicable or unspecified: Secondary | ICD-10-CM | POA: Diagnosis not present

## 2015-11-24 DIAGNOSIS — O3403 Maternal care for unspecified congenital malformation of uterus, third trimester: Secondary | ICD-10-CM

## 2015-11-24 DIAGNOSIS — Q513 Bicornate uterus: Secondary | ICD-10-CM

## 2015-11-24 DIAGNOSIS — O3663X1 Maternal care for excessive fetal growth, third trimester, fetus 1: Secondary | ICD-10-CM

## 2015-11-24 DIAGNOSIS — O09893 Supervision of other high risk pregnancies, third trimester: Secondary | ICD-10-CM

## 2015-11-24 DIAGNOSIS — O34593 Maternal care for other abnormalities of gravid uterus, third trimester: Secondary | ICD-10-CM

## 2015-11-24 LAB — POCT URINALYSIS DIP (DEVICE)
Glucose, UA: NEGATIVE mg/dL
HGB URINE DIPSTICK: NEGATIVE
KETONES UR: NEGATIVE mg/dL
Nitrite: NEGATIVE
PH: 7 (ref 5.0–8.0)
PROTEIN: 30 mg/dL — AB
Specific Gravity, Urine: 1.02 (ref 1.005–1.030)
Urobilinogen, UA: 0.2 mg/dL (ref 0.0–1.0)

## 2015-11-24 LAB — OB RESULTS CONSOLE GC/CHLAMYDIA: GC PROBE AMP, GENITAL: NEGATIVE

## 2015-11-24 NOTE — Patient Instructions (Signed)
Return to clinic for any scheduled appointments or obstetric concerns, or go to MAU for evaluation  

## 2015-11-24 NOTE — Progress Notes (Signed)
   PRENATAL VISIT NOTE  Subjective:  Tonya Walls is a 25 y.o. G1P0 at 6986w5d being seen today for ongoing prenatal care.  She is currently monitored for the following issues for this high-risk pregnancy and has Bicornuate uterus affecting pregnancy, antepartum and Supervision of high-risk pregnancy on her problem list.  Patient reports no complaints. Has not been here since 32 weeks, father recently passed away. Extended condolences to her and her family.  Contractions: Not present. Vag. Bleeding: None.  Movement: Present. Denies leaking of fluid.   The following portions of the patient's history were reviewed and updated as appropriate: allergies, current medications, past family history, past medical history, past social history, past surgical history and problem list. Problem list updated.  Objective:   Vitals:   11/24/15 1424  BP: 117/73  Pulse: 84  Weight: 291 lb 9.6 oz (132.3 kg)    Fetal Status: Fetal Heart Rate (bpm): 145 Fundal Height: 41 cm Movement: Present  Presentation: Undeterminable  General:  Alert, oriented and cooperative. Patient is in no acute distress.  Skin: Skin is warm and dry. No rash noted.   Cardiovascular: Normal heart rate noted  Respiratory: Normal respiratory effort, no problems with respiration noted  Abdomen: Soft, gravid, appropriate for gestational age. Pain/Pressure: Present     Pelvic:  Cervical exam performed Dilation: Closed Effacement (%): Thick Station: Ballotable  Extremities: Normal range of motion.  Edema: None  Mental Status: Normal mood and affect. Normal behavior. Normal judgment and thought content.   Urinalysis: Urine Protein: 1+ Urine Glucose: Negative  Assessment and Plan:  Pregnancy: G1P0 at 4886w5d  1. Bicornuate uterus affecting pregnancy, antepartum, third trimester Unable to determine presentation, will follow up scan  2. Large for dates affecting management of mother, third trimester, fetus 1 Growth scan ordered, will  also follow up fluid - US MFM OB FOLLOW UP; Future  3. Supervision of other high risk pregnancies, third trimester 1 hr GTT next visit (or can do fasting CBG) - Culture, beta strep (group b only) - GC[facility]Chlamydia probe amp [facility]  Term labor symptoms and general obstetric precautions including but not limited to vaginal bleeding, contractions, leaking of fluid and fetal movement were reviewed in detail with the patient. Please refer to After Visit Summary for other counseling recommendations.  Return in about 1 week (around 12/01/2015) for OB Visit.  Tereso NewcomerUgonna A Alfredo Collymore, MD

## 2015-11-25 ENCOUNTER — Encounter: Payer: Self-pay | Admitting: Obstetrics & Gynecology

## 2015-11-25 DIAGNOSIS — O99820 Streptococcus B carrier state complicating pregnancy: Secondary | ICD-10-CM | POA: Insufficient documentation

## 2015-11-25 LAB — GC/CHLAMYDIA PROBE AMP (~~LOC~~) NOT AT ARMC
CHLAMYDIA, DNA PROBE: NEGATIVE
Neisseria Gonorrhea: NEGATIVE

## 2015-11-25 LAB — OB RESULTS CONSOLE GBS: GBS: POSITIVE

## 2015-11-25 LAB — CULTURE, BETA STREP (GROUP B ONLY)

## 2015-11-27 ENCOUNTER — Encounter: Payer: Self-pay | Admitting: Obstetrics & Gynecology

## 2015-11-27 ENCOUNTER — Ambulatory Visit (HOSPITAL_COMMUNITY)
Admission: RE | Admit: 2015-11-27 | Discharge: 2015-11-27 | Disposition: A | Discharge status: Home / Self Care | Payer: Medicaid Other | Source: Ambulatory Visit | Attending: Obstetrics & Gynecology | Admitting: Obstetrics & Gynecology

## 2015-11-27 ENCOUNTER — Encounter (HOSPITAL_COMMUNITY): Payer: Self-pay

## 2015-11-27 DIAGNOSIS — Z3A38 38 weeks gestation of pregnancy: Secondary | ICD-10-CM | POA: Diagnosis not present

## 2015-11-27 DIAGNOSIS — O99212 Obesity complicating pregnancy, second trimester: Secondary | ICD-10-CM | POA: Insufficient documentation

## 2015-11-27 DIAGNOSIS — O3663X1 Maternal care for excessive fetal growth, third trimester, fetus 1: Secondary | ICD-10-CM

## 2015-11-27 DIAGNOSIS — Q513 Bicornate uterus: Secondary | ICD-10-CM | POA: Diagnosis not present

## 2015-11-27 DIAGNOSIS — O99210 Obesity complicating pregnancy, unspecified trimester: Secondary | ICD-10-CM | POA: Insufficient documentation

## 2015-11-27 DIAGNOSIS — O0993 Supervision of high risk pregnancy, unspecified, third trimester: Secondary | ICD-10-CM

## 2015-11-27 DIAGNOSIS — O99820 Streptococcus B carrier state complicating pregnancy: Secondary | ICD-10-CM

## 2015-11-27 DIAGNOSIS — O99213 Obesity complicating pregnancy, third trimester: Secondary | ICD-10-CM | POA: Insufficient documentation

## 2015-11-27 DIAGNOSIS — O34593 Maternal care for other abnormalities of gravid uterus, third trimester: Secondary | ICD-10-CM | POA: Diagnosis not present

## 2015-11-27 DIAGNOSIS — O26843 Uterine size-date discrepancy, third trimester: Secondary | ICD-10-CM | POA: Diagnosis not present

## 2015-11-27 HISTORY — DX: Obesity, class 3: E66.813

## 2015-11-27 HISTORY — DX: Bicornate uterus: Q51.3

## 2015-11-27 HISTORY — DX: Morbid (severe) obesity due to excess calories: E66.01

## 2015-12-03 ENCOUNTER — Ambulatory Visit (INDEPENDENT_AMBULATORY_CARE_PROVIDER_SITE_OTHER): Payer: Medicaid Other | Admitting: Obstetrics & Gynecology

## 2015-12-03 ENCOUNTER — Ambulatory Visit (INDEPENDENT_AMBULATORY_CARE_PROVIDER_SITE_OTHER): Payer: Medicaid Other | Admitting: Clinical

## 2015-12-03 VITALS — BP 124/70 | HR 96 | Wt 293.0 lb

## 2015-12-03 DIAGNOSIS — O0993 Supervision of high risk pregnancy, unspecified, third trimester: Secondary | ICD-10-CM

## 2015-12-03 DIAGNOSIS — O99213 Obesity complicating pregnancy, third trimester: Secondary | ICD-10-CM

## 2015-12-03 DIAGNOSIS — F4321 Adjustment disorder with depressed mood: Secondary | ICD-10-CM

## 2015-12-03 DIAGNOSIS — E669 Obesity, unspecified: Secondary | ICD-10-CM

## 2015-12-03 LAB — POCT URINALYSIS DIP (DEVICE)
Bilirubin Urine: NEGATIVE
Glucose, UA: NEGATIVE mg/dL
HGB URINE DIPSTICK: NEGATIVE
Ketones, ur: NEGATIVE mg/dL
NITRITE: NEGATIVE
PH: 7 (ref 5.0–8.0)
PROTEIN: NEGATIVE mg/dL
Specific Gravity, Urine: 1.015 (ref 1.005–1.030)
UROBILINOGEN UA: 0.2 mg/dL (ref 0.0–1.0)

## 2015-12-03 NOTE — Patient Instructions (Signed)
Vaginal Delivery °During delivery, your health care provider will help you give birth to your baby. During a vaginal delivery, you will work to push the baby out of your vagina. However, before you can push your baby out, a few things need to happen. The opening of your uterus (cervix) has to soften, thin out, and open up (dilate) all the way to 10 cm. Also, your baby has to move down from the uterus into your vagina.  °SIGNS OF LABOR  °Your health care provider will first need to make sure you are in labor. Signs of labor include:  °· Passing what is called the mucous plug before labor begins. This is a small amount of blood-stained mucus. °· Having regular, painful uterine contractions.   °· The time between contractions gets shorter.   °· The discomfort and pain gradually get more intense. °· Contraction pains get worse when walking and do not go away when resting.   °· Your cervix becomes thinner (effacement) and dilates. °BEFORE THE DELIVERY °Once you are in labor and admitted into the hospital or care center, your health care provider may do the following:  °· Perform a complete physical exam. °· Review any complications related to pregnancy or labor.  °· Check your blood pressure, pulse, temperature, and heart rate (vital signs).   °· Determine if, and when, the rupture of amniotic membranes occurred. °· Do a vaginal exam (using a sterile glove and lubricant) to determine:   °¨ The position (presentation) of the baby. Is the baby's head presenting first (vertex) in the birth canal (vagina), or are the feet or buttocks first (breech)?   °¨ The level (station) of the baby's head within the birth canal.   °¨ The effacement and dilatation of the cervix.   °· An electronic fetal monitor is usually placed on your abdomen when you first arrive. This is used to monitor your contractions and the baby's heart rate. °¨ When the monitor is on your abdomen (external fetal monitor), it can only pick up the frequency and  length of your contractions. It cannot tell the strength of your contractions. °¨ If it becomes necessary for your health care provider to know exactly how strong your contractions are or to see exactly what the baby's heart rate is doing, an internal monitor may be inserted into your vagina and uterus. Your health care provider will discuss the benefits and risks of using an internal monitor and obtain your permission before inserting the device. °¨ Continuous fetal monitoring may be needed if you have an epidural, are receiving certain medicines (such as oxytocin), or have pregnancy or labor complications. °· An IV access tube may be placed into a vein in your arm to deliver fluids and medicines if necessary. °THREE STAGES OF LABOR AND DELIVERY °Normal labor and delivery is divided into three stages. °First Stage °This stage starts when you begin to contract regularly and your cervix begins to efface and dilate. It ends when your cervix is completely open (fully dilated). The first stage is the longest stage of labor and can last from 3 hours to 15 hours.  °Several methods are available to help with labor pain. You and your health care provider will decide which option is best for you. Options include:  °· Opioid medicines. These are strong pain medicines that you can get through your IV tube or as a shot into your muscle. These medicines lessen pain but do not make it go away completely.  °· Epidural. A medicine is given through a thin tube that   is inserted in your back. The medicine numbs the lower part of your body and prevents any pain in that area. °· Paracervical pain medicine. This is an injection of an anesthetic on each side of your cervix.   °· You may request natural childbirth, which does not involve the use of pain medicines or an epidural during labor and delivery. Instead, you will use other things, such as breathing exercises, to help cope with the pain. °Second Stage °The second stage of labor  begins when your cervix is fully dilated at 10 cm. It continues until you push your baby down through the birth canal and the baby is born. This stage can take only minutes or several hours. °· The location of your baby's head as it moves through the birth canal is reported as a number called a station. If the baby's head has not started its descent, the station is described as being at minus 3 (-3). When your baby's head is at the zero station, it is at the middle of the birth canal and is engaged in the pelvis. The station of your baby helps indicate the progress of the second stage of labor. °· When your baby is born, your health care provider may hold the baby with his or her head lowered to prevent amniotic fluid, mucus, and blood from getting into the baby's lungs. The baby's mouth and nose may be suctioned with a small bulb syringe to remove any additional fluid. °· Your health care provider may then place the baby on your stomach. It is important to keep the baby from getting cold. To do this, the health care provider will dry the baby off, place the baby directly on your skin (with no blankets between you and the baby), and cover the baby with warm, dry blankets.   °· The umbilical cord is cut. °Third Stage °During the third stage of labor, your health care provider will deliver the placenta (afterbirth) and make sure your bleeding is under control. The delivery of the placenta usually takes about 5 minutes but can take up to 30 minutes. After the placenta is delivered, a medicine may be given either by IV or injection to help contract the uterus and control bleeding. If you are planning to breastfeed, you can try to do so now. °After you deliver the placenta, your uterus should contract and get very firm. If your uterus does not remain firm, your health care provider will massage it. This is important because the contraction of the uterus helps cut off bleeding at the site where the placenta was attached  to your uterus. If your uterus does not contract properly and stay firm, you may continue to bleed heavily. If there is a lot of bleeding, medicines may be given to contract the uterus and stop the bleeding.  °  °This information is not intended to replace advice given to you by your health care provider. Make sure you discuss any questions you have with your health care provider. °  °Document Released: 12/09/2007 Document Revised: 03/22/2014 Document Reviewed: 10/27/2011 °Elsevier Interactive Patient Education ©2016 [employer] ° °

## 2015-12-03 NOTE — BH Specialist Note (Signed)
  ASSESSMENT: Pt currently experiencing Adjustment disorder with depressed mood. Pt would benefit from psychoeducation and brief therapeutic intervention regarding coping with symptoms of depression.Pt may benefit from group social support postpartum. Stage of Change: determination  PLAN: 1. F/U with behavioral health clinician at postpartum visit, or earlier, as needed 2. Psychiatric Medications: none 3. Behavioral recommendations:   -Continue meditation music at night for sleep -Daily relaxation breathing exercise, as practiced in office visit -Attend Mom Talk, mom-led support group Tuesdays 10am (postpartum), followed by breastfeeding support class at 11am, for as long as it remains helpful -Read educational material regarding coping with symptoms of depression  SUBJECTIVE: Pt. referred by Scheryl DarterJames Arnold, MD, for symptoms of depression Pt. reports the following symptoms/concerns: Pt states that she has not experienced feelings of depression prior to pregnancy; currently feeling little energy and is open to learn strategies to help with relaxation, to use in early labor and postpartum to help cope.Pt currently uses meditation music to sleep. Duration of problem: Over one month Severity: moderate   OBJECTIVE: Orientation & Cognition: Oriented x3. Thought processes normal and appropriate to situation. Mood: appropriate Affect: appropriate Appearance: appropriate Risk of harm to self or others: no known risk of harm to self or others Substance use: none Assessments administered: PHQ9: 12/ GAD7: 5  Diagnosis: Adjustment disorder with depressed mood CPT Code: F43.21  -------------------------------------------- Other(s) present in the room: none  Time spent with patient in exam room: 30 minutes 3:00-3:30pm Visit number: 1 Intervention: Mindfulness  Depression screen PHQ 2/9 12/03/2015  Decreased Interest 2  Down, Depressed, Hopeless 0  PHQ - 2 Score 2  Altered sleeping 3  Tired,  decreased energy 3  Change in appetite 3  Feeling bad or failure about yourself  0  Trouble concentrating 1  Moving slowly or fidgety/restless 0  Suicidal thoughts 0  PHQ-9 Score 12   GAD 7 : Generalized Anxiety Score 12/03/2015  Nervous, Anxious, on Edge 0  Control/stop worrying 0  Worry too much - different things 0  Trouble relaxing 1  Restless 1  Easily annoyed or irritable 2  Afraid - awful might happen 1  Total GAD 7 Score 5

## 2015-12-03 NOTE — Progress Notes (Signed)
US 1 week ago 58%ile    PRENATAL VISIT NOTE  Subjective:  Tonya Walls is a 25 y.o. G1P0 at 7744w0d being seen today for ongoing prenatal care.  She is currently monitored for the following issues for this low-risk pregnancy and has Bicornuate uterus affecting pregnancy, antepartum; Supervision of high-risk pregnancy; Group B Streptococcus carrier, +RV culture, currently pregnant; and Obesity in pregnancy with BMI of 50 , antepartum on her problem list.  Patient reports no complaints.  Contractions: Irregular. Vag. Bleeding: None.  Movement: Present. Denies leaking of fluid.   The following portions of the patient's history were reviewed and updated as appropriate: allergies, current medications, past family history, past medical history, past social history, past surgical history and problem list. Problem list updated.  Objective:   Vitals:   12/03/15 1438  BP: 124/70  Pulse: 96  Weight: 293 lb (132.9 kg)    Fetal Status: Fetal Heart Rate (bpm): 141   Movement: Present     General:  Alert, oriented and cooperative. Patient is in no acute distress.  Skin: Skin is warm and dry. No rash noted.   Cardiovascular: Normal heart rate noted  Respiratory: Normal respiratory effort, no problems with respiration noted  Abdomen: Soft, gravid, appropriate for gestational age. Pain/Pressure: Present     Pelvic:  Cervical exam deferred        Extremities: Normal range of motion.  Edema: None  Mental Status: Normal mood and affect. Normal behavior. Normal judgment and thought content.   Urinalysis: Urine Protein: Negative Urine Glucose: Negative  Assessment and Plan:  Pregnancy: G1P0 at 6044w0d  1. High-risk pregnancy, third trimester  - Glucose Tolerance, 1 HR (50g) w/o Fasting  2. Supervision of high-risk pregnancy, third trimester   Term labor symptoms and general obstetric precautions including but not limited to vaginal bleeding, contractions, leaking of fluid and fetal movement were  reviewed in detail with the patient. Please refer to After Visit Summary for other counseling recommendations.  1 week Adam PhenixJames G Arnold, MD

## 2015-12-04 LAB — GLUCOSE TOLERANCE, 1 HOUR (50G) W/O FASTING: Glucose, 1 Hr, gestational: 125 mg/dL (ref <140)

## 2015-12-05 ENCOUNTER — Encounter: Payer: Medicaid Other | Admitting: Family Medicine

## 2015-12-12 ENCOUNTER — Ambulatory Visit (INDEPENDENT_AMBULATORY_CARE_PROVIDER_SITE_OTHER): Payer: Medicaid Other | Admitting: Family Medicine

## 2015-12-12 VITALS — BP 103/58 | HR 112 | Wt 294.6 lb

## 2015-12-12 DIAGNOSIS — Q513 Bicornate uterus: Secondary | ICD-10-CM | POA: Diagnosis not present

## 2015-12-12 DIAGNOSIS — O34593 Maternal care for other abnormalities of gravid uterus, third trimester: Secondary | ICD-10-CM

## 2015-12-12 DIAGNOSIS — O99820 Streptococcus B carrier state complicating pregnancy: Secondary | ICD-10-CM

## 2015-12-12 DIAGNOSIS — Z22330 Carrier of Group B streptococcus: Secondary | ICD-10-CM

## 2015-12-12 DIAGNOSIS — O3403 Maternal care for unspecified congenital malformation of uterus, third trimester: Secondary | ICD-10-CM

## 2015-12-12 DIAGNOSIS — O09899 Supervision of other high risk pregnancies, unspecified trimester: Secondary | ICD-10-CM

## 2015-12-12 DIAGNOSIS — O480 Post-term pregnancy: Secondary | ICD-10-CM

## 2015-12-12 NOTE — Progress Notes (Signed)
   PRENATAL VISIT NOTE  Subjective:  Tonya Walls is a 25 y.o. G1P0 at 854w2d being seen today for ongoing prenatal care.  She is currently monitored for the following issues for this high-risk pregnancy and has Bicornuate uterus affecting pregnancy, antepartum; Supervision of high-risk pregnancy; Group B Streptococcus carrier, +RV culture, currently pregnant; and Obesity in pregnancy with BMI of 50 , antepartum on her problem list.  Patient reports no complaints.  Contractions: Irregular.  .  Movement: Present. Denies leaking of fluid.   The following portions of the patient's history were reviewed and updated as appropriate: allergies, current medications, past family history, past medical history, past social history, past surgical history and problem list. Problem list updated.  Objective:   Vitals:   12/12/15 0928  BP: (!) 103/58  Pulse: (!) 112  Weight: 294 lb 9.6 oz (133.6 kg)    Fetal Status: Fetal Heart Rate (bpm): nst Fundal Height: 40 cm Movement: Present     General:  Alert, oriented and cooperative. Patient is in no acute distress.  Skin: Skin is warm and dry. No rash noted.   Cardiovascular: Normal heart rate noted  Respiratory: Normal respiratory effort, no problems with respiration noted  Abdomen: Soft, gravid, appropriate for gestational age. Pain/Pressure: Absent     Pelvic:  Cervical exam deferred        Extremities: Normal range of motion.  Edema: None  Mental Status: Normal mood and affect. Normal behavior. Normal judgment and thought content.   Urinalysis:      Assessment and Plan:  Pregnancy: G1P0 at 7454w2d  1. Supervision of other high risk pregnancies, unspecified trimester FHT and FH normal. NST today for >40 weeks - Reactive. Induction around 41 weeks.  2. Group B Streptococcus carrier, +RV culture, currently pregnant Intrapartum prophylaxis  3. Bicornuate uterus affecting pregnancy, antepartum, third trimester  Term labor symptoms and general  obstetric precautions including but not limited to vaginal bleeding, contractions, leaking of fluid and fetal movement were reviewed in detail with the patient. Please refer to After Visit Summary for other counseling recommendations.  No Follow-up on file.  Levie HeritageJacob J Stinson, DO

## 2015-12-13 ENCOUNTER — Encounter (HOSPITAL_COMMUNITY): Payer: Self-pay | Admitting: *Deleted

## 2015-12-13 ENCOUNTER — Inpatient Hospital Stay (HOSPITAL_COMMUNITY)
Admission: AD | Admit: 2015-12-13 | Discharge: 2015-12-13 | Disposition: A | Discharge status: Home / Self Care | Payer: Medicaid Other | Source: Ambulatory Visit | Attending: Obstetrics and Gynecology | Admitting: Obstetrics and Gynecology

## 2015-12-13 ENCOUNTER — Inpatient Hospital Stay (HOSPITAL_COMMUNITY): Payer: Medicaid Other

## 2015-12-13 DIAGNOSIS — N898 Other specified noninflammatory disorders of vagina: Secondary | ICD-10-CM

## 2015-12-13 DIAGNOSIS — Z6841 Body Mass Index (BMI) 40.0 and over, adult: Secondary | ICD-10-CM | POA: Diagnosis not present

## 2015-12-13 DIAGNOSIS — Z3A40 40 weeks gestation of pregnancy: Secondary | ICD-10-CM | POA: Diagnosis not present

## 2015-12-13 DIAGNOSIS — O99213 Obesity complicating pregnancy, third trimester: Secondary | ICD-10-CM | POA: Diagnosis not present

## 2015-12-13 DIAGNOSIS — O288 Other abnormal findings on antenatal screening of mother: Secondary | ICD-10-CM

## 2015-12-13 DIAGNOSIS — Z87891 Personal history of nicotine dependence: Secondary | ICD-10-CM | POA: Diagnosis not present

## 2015-12-13 DIAGNOSIS — O26893 Other specified pregnancy related conditions, third trimester: Secondary | ICD-10-CM

## 2015-12-13 LAB — POCT FERN TEST: POCT FERN TEST: NEGATIVE

## 2015-12-13 NOTE — Progress Notes (Signed)
Kim booker notified of patient BPP and negative fern, may d/c home

## 2015-12-13 NOTE — MAU Provider Note (Signed)
Chief Complaint:  No chief complaint on file.   First Provider Initiated Contact with Patient 12/13/15 0725      HPI: Tonya Walls is a 25 y.o. G1P0 at 1740w3dwho presents to maternity admissions reporting leakage of clear fluid this morning, enough to wet her underwear but not enough to require a pad. There is some pink discharge with the leakage. She also reports irregular contractions, but is unsure of how often they are.  She has not tried any treatments and nothing makes her symptoms better or worse.   She reports good fetal movement, denies, vaginal itching/burning, urinary symptoms, h/a, dizziness, n/v, or fever/chills.    Vaginal Discharge  The patient's primary symptoms include pelvic pain, vaginal bleeding (pink spotting) and vaginal discharge. The patient's pertinent negatives include no genital itching or genital lesions. This is a new problem. Associated symptoms include abdominal pain. Pertinent negatives include no chills, dysuria, fever, flank pain, headaches, nausea or vomiting.    Past Medical History: Past Medical History:  Diagnosis Date  . Bicornuate uterus   . Obesity, Class III, BMI 40-49.9 (morbid obesity) (HCC)     Past obstetric history: OB History  Gravida Para Term Preterm AB Living  1            SAB TAB Ectopic Multiple Live Births               # Outcome Date GA Lbr Len/2nd Weight Sex Delivery Anes PTL Lv  1 Current               Past Surgical History: Past Surgical History:  Procedure Laterality Date  . NO PAST SURGERIES      Family History: Family History  Problem Relation Age of Onset  . Diabetes Father     Social History: Social History  Substance Use Topics  . Smoking status: Former Smoker    Quit date: 11/13/2012  . Smokeless tobacco: Never Used  . Alcohol use No    Allergies: No Known Allergies  Meds:  Prescriptions Prior to Admission  Medication Sig Dispense Refill Last Dose  . calcium carbonate (TUMS - DOSED IN MG ELEMENTAL  CALCIUM) 500 MG chewable tablet Chew 1 tablet by mouth as needed for indigestion or heartburn.   Taking  . ferrous sulfate 325 (65 FE) MG tablet Take 1 tablet (325 mg total) by mouth 2 (two) times daily with a meal. 60 tablet 1 Taking  . pantoprazole (PROTONIX) 40 MG tablet Take 1 tablet (40 mg total) by mouth daily. 30 tablet 1 Taking  . Prenat w/o A Vit-FeFum-FePo-FA (CONCEPT OB) 130-92.4-1 MG CAPS Take 1 tablet by mouth daily. 30 capsule 12 Taking    ROS:  Review of Systems  Constitutional: Negative for chills, fatigue and fever.  Eyes: Negative for visual disturbance.  Respiratory: Negative for shortness of breath.   Cardiovascular: Negative for chest pain.  Gastrointestinal: Positive for abdominal pain. Negative for nausea and vomiting.  Genitourinary: Positive for pelvic pain, vaginal bleeding and vaginal discharge. Negative for difficulty urinating, dysuria, flank pain and vaginal pain.  Neurological: Negative for dizziness and headaches.  Psychiatric/Behavioral: Negative.      I have reviewed patient's Past Medical Hx, Surgical Hx, Family Hx, Social Hx, medications and allergies.   Physical Exam  Patient Vitals for the past 24 hrs:  BP Temp Temp src Pulse Resp  12/13/15 0703 94/81 - - (!) 130 -  12/13/15 0700 154/66 98.8 F (37.1 C) Oral 112 20   Constitutional: Well-developed,  well-nourished female in no acute distress.  Cardiovascular: normal rate Respiratory: normal effort GI: Abd soft, non-tender, gravid appropriate for gestational age.  MS: Extremities nontender, no edema, normal ROM Neurologic: Alert and oriented x 4.  GU: Neg CVAT.  PELVIC EXAM: SSE with negative pooling with valsalva  Dilation: Fingertip Effacement (%): Thick Presentation: Undeterminable Exam by:: [email or web address], rnc   FHT:  Baseline 150 , moderate variability, accelerations present (10x 10 with one 15 x 15 in 1 hour) no decelerations Contractions: rare, mild to palpation   Labs: Results  for orders placed or performed during the hospital encounter of 12/13/15 (from the past 24 hour(s))  Fern Test     Status: Normal   Collection Time: 12/13/15  7:12 AM  Result Value Ref Range   POCT Fern Test Negative = intact amniotic membranes    O/POS/-- (03/15 0946)  Imaging:  [place] [facility] Ob Follow Up  Result Date: 11/27/2015 OBSTETRICAL ULTRASOUND: This exam was performed within a [facility]. The OB US report was generated in the [facility] system, and faxed to the ordering physician.  This report is available in the [employer]. See the [facility] Obstetric US report via the Image Link.   MDM: No evidence of ROM or active labor today.  NST nonreactive with only 10 x 10 accels in 1 hour.  BPP ordered.  Report to Wynelle Bourgeois, CNM.  [name] Certified Nurse-Midwife 12/13/2015 8:13 AM   BPP score 8/8. AFI 18.66cm Cephalic presentation  Discharged home  [name], CNM

## 2015-12-13 NOTE — Discharge Instructions (Signed)
Vaginal Delivery °During delivery, your health care provider will help you give birth to your baby. During a vaginal delivery, you will work to push the baby out of your vagina. However, before you can push your baby out, a few things need to happen. The opening of your uterus (cervix) has to soften, thin out, and open up (dilate) all the way to 10 cm. Also, your baby has to move down from the uterus into your vagina.  °SIGNS OF LABOR  °Your health care provider will first need to make sure you are in labor. Signs of labor include:  °· Passing what is called the mucous plug before labor begins. This is a small amount of blood-stained mucus. °· Having regular, painful uterine contractions.   °· The time between contractions gets shorter.   °· The discomfort and pain gradually get more intense. °· Contraction pains get worse when walking and do not go away when resting.   °· Your cervix becomes thinner (effacement) and dilates. °BEFORE THE DELIVERY °Once you are in labor and admitted into the hospital or care center, your health care provider may do the following:  °· Perform a complete physical exam. °· Review any complications related to pregnancy or labor.  °· Check your blood pressure, pulse, temperature, and heart rate (vital signs).   °· Determine if, and when, the rupture of amniotic membranes occurred. °· Do a vaginal exam (using a sterile glove and lubricant) to determine:   °¨ The position (presentation) of the baby. Is the baby's head presenting first (vertex) in the birth canal (vagina), or are the feet or buttocks first (breech)?   °¨ The level (station) of the baby's head within the birth canal.   °¨ The effacement and dilatation of the cervix.   °· An electronic fetal monitor is usually placed on your abdomen when you first arrive. This is used to monitor your contractions and the baby's heart rate. °¨ When the monitor is on your abdomen (external fetal monitor), it can only pick up the frequency and  length of your contractions. It cannot tell the strength of your contractions. °¨ If it becomes necessary for your health care provider to know exactly how strong your contractions are or to see exactly what the baby's heart rate is doing, an internal monitor may be inserted into your vagina and uterus. Your health care provider will discuss the benefits and risks of using an internal monitor and obtain your permission before inserting the device. °¨ Continuous fetal monitoring may be needed if you have an epidural, are receiving certain medicines (such as oxytocin), or have pregnancy or labor complications. °· An IV access tube may be placed into a vein in your arm to deliver fluids and medicines if necessary. °THREE STAGES OF LABOR AND DELIVERY °Normal labor and delivery is divided into three stages. °First Stage °This stage starts when you begin to contract regularly and your cervix begins to efface and dilate. It ends when your cervix is completely open (fully dilated). The first stage is the longest stage of labor and can last from 3 hours to 15 hours.  °Several methods are available to help with labor pain. You and your health care provider will decide which option is best for you. Options include:  °· Opioid medicines. These are strong pain medicines that you can get through your IV tube or as a shot into your muscle. These medicines lessen pain but do not make it go away completely.  °· Epidural. A medicine is given through a thin tube that   is inserted in your back. The medicine numbs the lower part of your body and prevents any pain in that area. °· Paracervical pain medicine. This is an injection of an anesthetic on each side of your cervix.   °· You may request natural childbirth, which does not involve the use of pain medicines or an epidural during labor and delivery. Instead, you will use other things, such as breathing exercises, to help cope with the pain. °Second Stage °The second stage of labor  begins when your cervix is fully dilated at 10 cm. It continues until you push your baby down through the birth canal and the baby is born. This stage can take only minutes or several hours. °· The location of your baby's head as it moves through the birth canal is reported as a number called a station. If the baby's head has not started its descent, the station is described as being at minus 3 (-3). When your baby's head is at the zero station, it is at the middle of the birth canal and is engaged in the pelvis. The station of your baby helps indicate the progress of the second stage of labor. °· When your baby is born, your health care provider may hold the baby with his or her head lowered to prevent amniotic fluid, mucus, and blood from getting into the baby's lungs. The baby's mouth and nose may be suctioned with a small bulb syringe to remove any additional fluid. °· Your health care provider may then place the baby on your stomach. It is important to keep the baby from getting cold. To do this, the health care provider will dry the baby off, place the baby directly on your skin (with no blankets between you and the baby), and cover the baby with warm, dry blankets.   °· The umbilical cord is cut. °Third Stage °During the third stage of labor, your health care provider will deliver the placenta (afterbirth) and make sure your bleeding is under control. The delivery of the placenta usually takes about 5 minutes but can take up to 30 minutes. After the placenta is delivered, a medicine may be given either by IV or injection to help contract the uterus and control bleeding. If you are planning to breastfeed, you can try to do so now. °After you deliver the placenta, your uterus should contract and get very firm. If your uterus does not remain firm, your health care provider will massage it. This is important because the contraction of the uterus helps cut off bleeding at the site where the placenta was attached  to your uterus. If your uterus does not contract properly and stay firm, you may continue to bleed heavily. If there is a lot of bleeding, medicines may be given to contract the uterus and stop the bleeding.  °  °This information is not intended to replace advice given to you by your health care provider. Make sure you discuss any questions you have with your health care provider. °  °Document Released: 12/09/2007 Document Revised: 03/22/2014 Document Reviewed: 10/27/2011 °Elsevier Interactive Patient Education ©2016 [employer] ° °

## 2015-12-14 ENCOUNTER — Encounter (HOSPITAL_COMMUNITY): Payer: Self-pay | Admitting: *Deleted

## 2015-12-14 ENCOUNTER — Inpatient Hospital Stay (HOSPITAL_COMMUNITY)
Admission: AD | Admit: 2015-12-14 | Discharge: 2015-12-14 | Disposition: A | Discharge status: Home / Self Care | Payer: Medicaid Other | Source: Ambulatory Visit | Attending: Family Medicine | Admitting: Family Medicine

## 2015-12-14 NOTE — MAU Note (Signed)
Pt states contractions started getting worse around 2200 last night.  Pt states she feels like contractions are 5-6 minutes apart.  Pt states she is feeling the baby move.  Pt states she had some fluid leaking yesterday and was told she peed on herself when she came here.  Pt denies leaking since yesterday when she was seen and states she is having some bleeding.

## 2015-12-14 NOTE — Discharge Instructions (Signed)
Fetal Movement Counts °Patient Name: __________________________________________________ Patient Due Date: ____________________ °Performing a fetal movement count is highly recommended in high-risk pregnancies, but it is good for every pregnant woman to do. Your health care provider may ask you to start counting fetal movements at 28 weeks of the pregnancy. Fetal movements often increase: °· After eating a full meal. °· After physical activity. °· After eating or drinking something sweet or cold. °· At rest. °Pay attention to when you feel the baby is most active. This will help you notice a pattern of your baby's sleep and wake cycles and what factors contribute to an increase in fetal movement. It is important to perform a fetal movement count at the same time each day when your baby is normally most active.  °HOW TO COUNT FETAL MOVEMENTS °1. Find a quiet and comfortable area to sit or lie down on your left side. Lying on your left side provides the best blood and oxygen circulation to your baby. °2. Write down the day and time on a sheet of paper or in a journal. °3. Start counting kicks, flutters, swishes, rolls, or jabs in a 2-hour period. You should feel at least 10 movements within 2 hours. °4. If you do not feel 10 movements in 2 hours, wait 2-3 hours and count again. Look for a change in the pattern or not enough counts in 2 hours. °SEEK MEDICAL CARE IF: °· You feel less than 10 counts in 2 hours, tried twice. °· There is no movement in over an hour. °· The pattern is changing or taking longer each day to reach 10 counts in 2 hours. °· You feel the baby is not moving as he or she usually does. °Date: ____________ Movements: ____________ Start time: ____________ Finish time: ____________  °Date: ____________ Movements: ____________ Start time: ____________ Finish time: ____________ °Date: ____________ Movements: ____________ Start time: ____________ Finish time: ____________ °Date: ____________ Movements:  ____________ Start time: ____________ Finish time: ____________ °Date: ____________ Movements: ____________ Start time: ____________ Finish time: ____________ °Date: ____________ Movements: ____________ Start time: ____________ Finish time: ____________ °Date: ____________ Movements: ____________ Start time: ____________ Finish time: ____________ °Date: ____________ Movements: ____________ Start time: ____________ Finish time: ____________  °Date: ____________ Movements: ____________ Start time: ____________ Finish time: ____________ °Date: ____________ Movements: ____________ Start time: ____________ Finish time: ____________ °Date: ____________ Movements: ____________ Start time: ____________ Finish time: ____________ °Date: ____________ Movements: ____________ Start time: ____________ Finish time: ____________ °Date: ____________ Movements: ____________ Start time: ____________ Finish time: ____________ °Date: ____________ Movements: ____________ Start time: ____________ Finish time: ____________ °Date: ____________ Movements: ____________ Start time: ____________ Finish time: ____________  °Date: ____________ Movements: ____________ Start time: ____________ Finish time: ____________ °Date: ____________ Movements: ____________ Start time: ____________ Finish time: ____________ °Date: ____________ Movements: ____________ Start time: ____________ Finish time: ____________ °Date: ____________ Movements: ____________ Start time: ____________ Finish time: ____________ °Date: ____________ Movements: ____________ Start time: ____________ Finish time: ____________ °Date: ____________ Movements: ____________ Start time: ____________ Finish time: ____________ °Date: ____________ Movements: ____________ Start time: ____________ Finish time: ____________  °Date: ____________ Movements: ____________ Start time: ____________ Finish time: ____________ °Date: ____________ Movements: ____________ Start time: ____________ Finish  time: ____________ °Date: ____________ Movements: ____________ Start time: ____________ Finish time: ____________ °Date: ____________ Movements: ____________ Start time: ____________ Finish time: ____________ °Date: ____________ Movements: ____________ Start time: ____________ Finish time: ____________ °Date: ____________ Movements: ____________ Start time: ____________ Finish time: ____________ °Date: ____________ Movements: ____________ Start time: ____________ Finish time: ____________  °Date: ____________ Movements: ____________ Start time: ____________ Finish   time: ____________ °Date: ____________ Movements: ____________ Start time: ____________ Finish time: ____________ °Date: ____________ Movements: ____________ Start time: ____________ Finish time: ____________ °Date: ____________ Movements: ____________ Start time: ____________ Finish time: ____________ °Date: ____________ Movements: ____________ Start time: ____________ Finish time: ____________ °Date: ____________ Movements: ____________ Start time: ____________ Finish time: ____________ °Date: ____________ Movements: ____________ Start time: ____________ Finish time: ____________  °Date: ____________ Movements: ____________ Start time: ____________ Finish time: ____________ °Date: ____________ Movements: ____________ Start time: ____________ Finish time: ____________ °Date: ____________ Movements: ____________ Start time: ____________ Finish time: ____________ °Date: ____________ Movements: ____________ Start time: ____________ Finish time: ____________ °Date: ____________ Movements: ____________ Start time: ____________ Finish time: ____________ °Date: ____________ Movements: ____________ Start time: ____________ Finish time: ____________ °Date: ____________ Movements: ____________ Start time: ____________ Finish time: ____________  °Date: ____________ Movements: ____________ Start time: ____________ Finish time: ____________ °Date: ____________  Movements: ____________ Start time: ____________ Finish time: ____________ °Date: ____________ Movements: ____________ Start time: ____________ Finish time: ____________ °Date: ____________ Movements: ____________ Start time: ____________ Finish time: ____________ °Date: ____________ Movements: ____________ Start time: ____________ Finish time: ____________ °Date: ____________ Movements: ____________ Start time: ____________ Finish time: ____________ °Date: ____________ Movements: ____________ Start time: ____________ Finish time: ____________  °Date: ____________ Movements: ____________ Start time: ____________ Finish time: ____________ °Date: ____________ Movements: ____________ Start time: ____________ Finish time: ____________ °Date: ____________ Movements: ____________ Start time: ____________ Finish time: ____________ °Date: ____________ Movements: ____________ Start time: ____________ Finish time: ____________ °Date: ____________ Movements: ____________ Start time: ____________ Finish time: ____________ °Date: ____________ Movements: ____________ Start time: ____________ Finish time: ____________ °  °This information is not intended to replace advice given to you by your health care provider. Make sure you discuss any questions you have with your health care provider. °  °Document Released: 03/31/2006 Document Revised: 03/22/2014 Document Reviewed: 12/27/2011 °[employer]vier Interactive Patient Education ©2016 [employer] °Third Trimester of Pregnancy °The third trimester is from week 29 through week 42, months 7 through 9. The third trimester is a time when the fetus is growing rapidly. At the end of the ninth month, the fetus is about 20 inches in length and weighs 6-10 pounds.  °BODY CHANGES °Your body goes through many changes during pregnancy. The changes vary from woman to woman.  °· Your weight will continue to increase. You can expect to gain 25-35 pounds (11-16 kg) by the end of the pregnancy. °· You may  begin to get stretch marks on your hips, abdomen, and breasts. °· You may urinate more often because the fetus is moving lower into your pelvis and pressing on your bladder. °· You may develop or continue to have heartburn as a result of your pregnancy. °· You may develop constipation because certain hormones are causing the muscles that push waste through your intestines to slow down. °· You may develop hemorrhoids or swollen, bulging veins (varicose veins). °· You may have pelvic pain because of the weight gain and pregnancy hormones relaxing your joints between the bones in your pelvis. Backaches may result from overexertion of the muscles supporting your posture. °· You may have changes in your hair. These can include thickening of your hair, rapid growth, and changes in texture. Some women also have hair loss during or after pregnancy, or hair that feels dry or thin. Your hair will most likely return to normal after your baby is born. °· Your breasts will continue to grow and be tender. A yellow discharge may leak from your breasts called colostrum. °· Your belly button may stick out. °·   You may feel short of breath because of your expanding uterus. °· You may notice the fetus "dropping," or moving lower in your abdomen. °· You may have a bloody mucus discharge. This usually occurs a few days to a week before labor begins. °· Your cervix becomes thin and soft (effaced) near your due date. °WHAT TO EXPECT AT YOUR PRENATAL EXAMS  °You will have prenatal exams every 2 weeks until week 36. Then, you will have weekly prenatal exams. During a routine prenatal visit: °· You will be weighed to make sure you and the fetus are growing normally. °· Your blood pressure is taken. °· Your abdomen will be measured to track your baby's growth. °· The fetal heartbeat will be listened to. °· Any test results from the previous visit will be discussed. °· You may have a cervical check near your due date to see if you have  effaced. °At around 36 weeks, your caregiver will check your cervix. At the same time, your caregiver will also perform a test on the secretions of the vaginal tissue. This test is to determine if a type of bacteria, Group B streptococcus, is present. Your caregiver will explain this further. °Your caregiver may ask you: °· What your birth plan is. °· How you are feeling. °· If you are feeling the baby move. °· If you have had any abnormal symptoms, such as leaking fluid, bleeding, severe headaches, or abdominal cramping. °· If you are using any tobacco products, including cigarettes, chewing tobacco, and electronic cigarettes. °· If you have any questions. °Other tests or screenings that may be performed during your third trimester include: °· Blood tests that check for low iron levels (anemia). °· Fetal testing to check the health, activity level, and growth of the fetus. Testing is done if you have certain medical conditions or if there are problems during the pregnancy. °· HIV (human immunodeficiency virus) testing. If you are at high risk, you may be screened for HIV during your third trimester of pregnancy. °FALSE LABOR °You may feel small, irregular contractions that eventually go away. These are called Braxton Hicks contractions, or false labor. Contractions may last for hours, days, or even weeks before true labor sets in. If contractions come at regular intervals, intensify, or become painful, it is best to be seen by your caregiver.  °SIGNS OF LABOR  °· Menstrual-like cramps. °· Contractions that are 5 minutes apart or less. °· Contractions that start on the top of the uterus and spread down to the lower abdomen and back. °· A sense of increased pelvic pressure or back pain. °· A watery or bloody mucus discharge that comes from the vagina. °If you have any of these signs before the 37th week of pregnancy, call your caregiver right away. You need to go to the hospital to get checked immediately. °HOME CARE  INSTRUCTIONS  °· Avoid all smoking, herbs, alcohol, and unprescribed drugs. These chemicals affect the formation and growth of the baby. °· Do not use any tobacco products, including cigarettes, chewing tobacco, and electronic cigarettes. If you need help quitting, ask your health care provider. You may receive counseling support and other resources to help you quit. °· Follow your caregiver's instructions regarding medicine use. There are medicines that are either safe or unsafe to take during pregnancy. °· Exercise only as directed by your caregiver. Experiencing uterine cramps is a good sign to stop exercising. °· Continue to eat regular, healthy meals. °· Wear a good support bra for   breast tenderness. °· Do not use hot tubs, steam rooms, or saunas. °· Wear your seat belt at all times when driving. °· Avoid raw meat, uncooked cheese, cat litter boxes, and soil used by cats. These carry germs that can cause birth defects in the baby. °· Take your prenatal vitamins. °· Take 1500-2000 mg of calcium daily starting at the 20th week of pregnancy until you deliver your baby. °· Try taking a stool softener (if your caregiver approves) if you develop constipation. Eat more high-fiber foods, such as fresh vegetables or fruit and whole grains. Drink plenty of fluids to keep your urine clear or pale yellow. °· Take warm sitz baths to soothe any pain or discomfort caused by hemorrhoids. Use hemorrhoid cream if your caregiver approves. °· If you develop varicose veins, wear support hose. Elevate your feet for 15 minutes, 3-4 times a day. Limit salt in your diet. °· Avoid heavy lifting, wear low heal shoes, and practice good posture. °· Rest a lot with your legs elevated if you have leg cramps or low back pain. °· Visit your dentist if you have not gone during your pregnancy. Use a soft toothbrush to brush your teeth and be gentle when you floss. °· A sexual relationship may be continued unless your caregiver directs you  otherwise. °· Do not travel far distances unless it is absolutely necessary and only with the approval of your caregiver. °· Take prenatal classes to understand, practice, and ask questions about the labor and delivery. °· Make a trial run to the hospital. °· Pack your hospital bag. °· Prepare the baby's nursery. °· Continue to go to all your prenatal visits as directed by your caregiver. °SEEK MEDICAL CARE IF: °· You are unsure if you are in labor or if your water has broken. °· You have dizziness. °· You have mild pelvic cramps, pelvic pressure, or nagging pain in your abdominal area. °· You have persistent nausea, vomiting, or diarrhea. °· You have a bad smelling vaginal discharge. °· You have pain with urination. °SEEK IMMEDIATE MEDICAL CARE IF:  °· You have a fever. °· You are leaking fluid from your vagina. °· You have spotting or bleeding from your vagina. °· You have severe abdominal cramping or pain. °· You have rapid weight loss or gain. °· You have shortness of breath with chest pain. °· You notice sudden or extreme swelling of your face, hands, ankles, feet, or legs. °· You have not felt your baby move in over an hour. °· You have severe headaches that do not go away with medicine. °· You have vision changes. °  °This information is not intended to replace advice given to you by your health care provider. Make sure you discuss any questions you have with your health care provider. °  °Document Released: 02/23/2001 Document Revised: 03/22/2014 Document Reviewed: 05/02/2012 °Elsevier Interactive Patient Education ©2016 [employer] ° °

## 2015-12-14 NOTE — Progress Notes (Signed)
Philipp DeputyKim Shaw, CNM notified of pt in MAU.  Notified that pt is a G1P0 at 602w4d.  Notified that pt was 0.5cm and thick last night and today is 1cm, thick, and reports bloody show but no blood was seen on the RN's glove when the pt's cervix was checked.  Provider states that pt can be discharged home when FHR is reactive.

## 2015-12-15 ENCOUNTER — Inpatient Hospital Stay (HOSPITAL_COMMUNITY)
Admission: AD | Admit: 2015-12-15 | Discharge: 2015-12-17 | DRG: 775 | Disposition: A | Discharge status: Home / Self Care | Payer: Medicaid Other | Source: Ambulatory Visit | Attending: Obstetrics and Gynecology | Admitting: Obstetrics and Gynecology

## 2015-12-15 ENCOUNTER — Inpatient Hospital Stay (HOSPITAL_COMMUNITY): Payer: Medicaid Other | Admitting: Anesthesiology

## 2015-12-15 ENCOUNTER — Encounter (HOSPITAL_COMMUNITY): Payer: Self-pay

## 2015-12-15 ENCOUNTER — Other Ambulatory Visit: Payer: Self-pay | Admitting: Certified Nurse Midwife

## 2015-12-15 DIAGNOSIS — O3403 Maternal care for unspecified congenital malformation of uterus, third trimester: Secondary | ICD-10-CM | POA: Diagnosis present

## 2015-12-15 DIAGNOSIS — Z3403 Encounter for supervision of normal first pregnancy, third trimester: Secondary | ICD-10-CM | POA: Diagnosis present

## 2015-12-15 DIAGNOSIS — O99214 Obesity complicating childbirth: Secondary | ICD-10-CM | POA: Diagnosis present

## 2015-12-15 DIAGNOSIS — Z833 Family history of diabetes mellitus: Secondary | ICD-10-CM | POA: Diagnosis not present

## 2015-12-15 DIAGNOSIS — O0993 Supervision of high risk pregnancy, unspecified, third trimester: Secondary | ICD-10-CM

## 2015-12-15 DIAGNOSIS — Z87891 Personal history of nicotine dependence: Secondary | ICD-10-CM | POA: Diagnosis not present

## 2015-12-15 DIAGNOSIS — Z3A40 40 weeks gestation of pregnancy: Secondary | ICD-10-CM

## 2015-12-15 DIAGNOSIS — O99820 Streptococcus B carrier state complicating pregnancy: Secondary | ICD-10-CM

## 2015-12-15 DIAGNOSIS — Z6841 Body Mass Index (BMI) 40.0 and over, adult: Secondary | ICD-10-CM

## 2015-12-15 DIAGNOSIS — Z3490 Encounter for supervision of normal pregnancy, unspecified, unspecified trimester: Secondary | ICD-10-CM

## 2015-12-15 DIAGNOSIS — O99210 Obesity complicating pregnancy, unspecified trimester: Secondary | ICD-10-CM

## 2015-12-15 DIAGNOSIS — Q513 Bicornate uterus: Secondary | ICD-10-CM | POA: Diagnosis not present

## 2015-12-15 DIAGNOSIS — O99824 Streptococcus B carrier state complicating childbirth: Secondary | ICD-10-CM | POA: Diagnosis present

## 2015-12-15 LAB — TYPE AND SCREEN
ABO/RH(D): O POS
Antibody Screen: NEGATIVE

## 2015-12-15 LAB — CBC
HCT: 33.1 % — ABNORMAL LOW (ref 36.0–46.0)
Hemoglobin: 11 g/dL — ABNORMAL LOW (ref 12.0–15.0)
MCH: 28.9 pg (ref 26.0–34.0)
MCHC: 33.2 g/dL (ref 30.0–36.0)
MCV: 87.1 fL (ref 78.0–100.0)
PLATELETS: 267 10*3/uL (ref 150–400)
RBC: 3.8 MIL/uL — AB (ref 3.87–5.11)
RDW: 14.4 % (ref 11.5–15.5)
WBC: 11 10*3/uL — AB (ref 4.0–10.5)

## 2015-12-15 LAB — RPR: RPR: NONREACTIVE

## 2015-12-15 LAB — ABO/RH: ABO/RH(D): O POS

## 2015-12-15 MED ORDER — ZOLPIDEM TARTRATE 5 MG PO TABS
5.0000 mg | ORAL_TABLET | Freq: Every evening | ORAL | Status: DC | PRN
Start: 2015-12-15 — End: 2015-12-17

## 2015-12-15 MED ORDER — OXYTOCIN BOLUS FROM INFUSION: 500.0000 mL | Freq: Once | INTRAVENOUS | Status: DC

## 2015-12-15 MED ORDER — OXYCODONE-ACETAMINOPHEN 5-325 MG PO TABS: 1.0000 | ORAL_TABLET | ORAL | Status: DC | PRN

## 2015-12-15 MED ORDER — OXYCODONE HCL 5 MG PO TABS: 5.0000 mg | ORAL_TABLET | ORAL | Status: DC | PRN

## 2015-12-15 MED ORDER — LIDOCAINE HCL (PF) 1 % IJ SOLN
30.0000 mL | INTRAMUSCULAR | Status: DC | PRN
  Filled 2015-12-15: qty 30

## 2015-12-15 MED ORDER — ONDANSETRON HCL 4 MG/2ML IJ SOLN
4.0000 mg | Freq: Four times a day (QID) | INTRAMUSCULAR | Status: DC | PRN
  Administered 2015-12-15: 4 mg via INTRAVENOUS
  Filled 2015-12-15: qty 2

## 2015-12-15 MED ORDER — LACTATED RINGERS IV SOLN
500.0000 mL | Freq: Once | INTRAVENOUS | Status: AC
  Administered 2015-12-15: 1000 mL via INTRAVENOUS

## 2015-12-15 MED ORDER — ACETAMINOPHEN 325 MG PO TABS
650.0000 mg | ORAL_TABLET | ORAL | Status: DC | PRN
Start: 2015-12-15 — End: 2015-12-17

## 2015-12-15 MED ORDER — LACTATED RINGERS IV SOLN
500.0000 mL | INTRAVENOUS | Status: DC | PRN
  Administered 2015-12-15: 1000 mL via INTRAVENOUS

## 2015-12-15 MED ORDER — FENTANYL 2.5 MCG/ML BUPIVACAINE 1/10 % EPIDURAL INFUSION (WH - ANES)
14.0000 mL/h | INTRAMUSCULAR | Status: DC | PRN
  Administered 2015-12-15 (×2): 14 mL/h via EPIDURAL
  Filled 2015-12-15: qty 125

## 2015-12-15 MED ORDER — FENTANYL CITRATE (PF) 100 MCG/2ML IJ SOLN
100.0000 ug | INTRAMUSCULAR | Status: DC | PRN
  Administered 2015-12-15: 100 ug via INTRAVENOUS
  Filled 2015-12-15: qty 2

## 2015-12-15 MED ORDER — DIPHENHYDRAMINE HCL 25 MG PO CAPS: 25.0000 mg | ORAL_CAPSULE | Freq: Four times a day (QID) | ORAL | Status: DC | PRN

## 2015-12-15 MED ORDER — LIDOCAINE HCL (PF) 1 % IJ SOLN
INTRAMUSCULAR | Status: DC | PRN
  Administered 2015-12-15 (×2): 7 mL via EPIDURAL

## 2015-12-15 MED ORDER — SIMETHICONE 80 MG PO CHEW: 80.0000 mg | CHEWABLE_TABLET | ORAL | Status: DC | PRN

## 2015-12-15 MED ORDER — LACTATED RINGERS IV SOLN
INTRAVENOUS | Status: DC
  Administered 2015-12-15 (×3): via INTRAVENOUS

## 2015-12-15 MED ORDER — WITCH HAZEL-GLYCERIN EX PADS: 1.0000 "application " | MEDICATED_PAD | CUTANEOUS | Status: DC | PRN

## 2015-12-15 MED ORDER — PENICILLIN G POTASSIUM 5000000 UNITS IJ SOLR
5.0000 10*6.[IU] | Freq: Once | INTRAVENOUS | Status: AC
  Administered 2015-12-15: 5 10*6.[IU] via INTRAVENOUS
  Filled 2015-12-15: qty 5

## 2015-12-15 MED ORDER — INFLUENZA VAC SPLIT QUAD 0.5 ML IM SUSY
0.5000 mL | PREFILLED_SYRINGE | INTRAMUSCULAR | Status: AC
  Administered 2015-12-16: 0.5 mL via INTRAMUSCULAR
  Filled 2015-12-15: qty 0.5

## 2015-12-15 MED ORDER — SOD CITRATE-CITRIC ACID 500-334 MG/5ML PO SOLN: 30.0000 mL | ORAL | Status: DC | PRN

## 2015-12-15 MED ORDER — ONDANSETRON HCL 4 MG PO TABS
4.0000 mg | ORAL_TABLET | ORAL | Status: DC | PRN
Start: 2015-12-15 — End: 2015-12-17

## 2015-12-15 MED ORDER — EPHEDRINE 5 MG/ML INJ
10.0000 mg | INTRAVENOUS | Status: DC | PRN
  Filled 2015-12-15: qty 4

## 2015-12-15 MED ORDER — BENZOCAINE-MENTHOL 20-0.5 % EX AERO
1.0000 "application " | INHALATION_SPRAY | CUTANEOUS | Status: DC | PRN
  Administered 2015-12-15: 1 via TOPICAL
  Filled 2015-12-15: qty 56

## 2015-12-15 MED ORDER — COCONUT OIL OIL: 1.0000 "application " | TOPICAL_OIL | Status: DC | PRN

## 2015-12-15 MED ORDER — PRENATAL MULTIVITAMIN CH
1.0000 | ORAL_TABLET | Freq: Every day | ORAL | Status: DC
  Administered 2015-12-16: 1 via ORAL
  Filled 2015-12-15: qty 1

## 2015-12-15 MED ORDER — DIPHENHYDRAMINE HCL 50 MG/ML IJ SOLN: 12.5000 mg | INTRAMUSCULAR | Status: DC | PRN

## 2015-12-15 MED ORDER — DIBUCAINE 1 % RE OINT: 1.0000 "application " | TOPICAL_OINTMENT | RECTAL | Status: DC | PRN

## 2015-12-15 MED ORDER — ONDANSETRON HCL 4 MG/2ML IJ SOLN: 4.0000 mg | INTRAMUSCULAR | Status: DC | PRN

## 2015-12-15 MED ORDER — PHENYLEPHRINE 40 MCG/ML (10ML) SYRINGE FOR IV PUSH (FOR BLOOD PRESSURE SUPPORT)
80.0000 ug | PREFILLED_SYRINGE | INTRAVENOUS | Status: DC | PRN
  Filled 2015-12-15: qty 10
  Filled 2015-12-15: qty 5

## 2015-12-15 MED ORDER — SENNOSIDES-DOCUSATE SODIUM 8.6-50 MG PO TABS
2.0000 | ORAL_TABLET | ORAL | Status: DC
  Administered 2015-12-15 – 2015-12-17 (×2): 2 via ORAL
  Filled 2015-12-15 (×2): qty 2

## 2015-12-15 MED ORDER — ACETAMINOPHEN 325 MG PO TABS
650.0000 mg | ORAL_TABLET | ORAL | Status: DC | PRN
  Administered 2015-12-15: 650 mg via ORAL
  Filled 2015-12-15: qty 2

## 2015-12-15 MED ORDER — PHENYLEPHRINE 40 MCG/ML (10ML) SYRINGE FOR IV PUSH (FOR BLOOD PRESSURE SUPPORT)
80.0000 ug | PREFILLED_SYRINGE | INTRAVENOUS | Status: DC | PRN
  Filled 2015-12-15: qty 5

## 2015-12-15 MED ORDER — FLEET ENEMA 7-19 GM/118ML RE ENEM: 1.0000 | ENEMA | RECTAL | Status: DC | PRN

## 2015-12-15 MED ORDER — OXYTOCIN 40 UNITS IN LACTATED RINGERS INFUSION - SIMPLE MED
2.5000 [IU]/h | INTRAVENOUS | Status: DC
  Filled 2015-12-15: qty 1000

## 2015-12-15 MED ORDER — TETANUS-DIPHTH-ACELL PERTUSSIS 5-2.5-18.5 LF-MCG/0.5 IM SUSP: 0.5000 mL | Freq: Once | INTRAMUSCULAR | Status: DC

## 2015-12-15 MED ORDER — OXYCODONE-ACETAMINOPHEN 5-325 MG PO TABS: 2.0000 | ORAL_TABLET | ORAL | Status: DC | PRN

## 2015-12-15 MED ORDER — DEXTROSE 5 % IV SOLN
2.5000 10*6.[IU] | INTRAVENOUS | Status: DC
  Administered 2015-12-15 (×2): 2.5 10*6.[IU] via INTRAVENOUS
  Filled 2015-12-15 (×4): qty 2.5

## 2015-12-15 MED ORDER — IBUPROFEN 600 MG PO TABS
600.0000 mg | ORAL_TABLET | Freq: Four times a day (QID) | ORAL | Status: DC
  Administered 2015-12-15 – 2015-12-17 (×6): 600 mg via ORAL
  Filled 2015-12-15 (×6): qty 1

## 2015-12-15 NOTE — H&P (Signed)
Tonya Walls is a 25 y.o. female @ 40.5 wks G1P0 presenting for contractions since last pm that have worsened overnight. Denies any VB or LOF. GBS pos. NKA.  OB History    Gravida Para Term Preterm AB Living   1             SAB TAB Ectopic Multiple Live Births                 Past Medical History:  Diagnosis Date  . Bicornuate uterus   . Obesity, Class III, BMI 40-49.9 (morbid obesity) (HCC)    Past Surgical History:  Procedure Laterality Date  . NO PAST SURGERIES     Family History: family history includes Diabetes in her father. Social History:  reports that she quit smoking about 3 years ago. She has never used smokeless tobacco. She reports that she does not drink alcohol or use drugs.     Maternal Diabetes: No Genetic Screening: Normal Maternal Ultrasounds/Referrals: Normal Fetal Ultrasounds or other Referrals:  None Maternal Substance Abuse:  No Significant Maternal Medications:  None Significant Maternal Lab Results:  None Other Comments:  None  Review of Systems  Constitutional: Negative.   HENT: Negative.   Eyes: Negative.   Respiratory: Negative.   Cardiovascular: Negative.   Gastrointestinal: Positive for abdominal pain.  Genitourinary: Negative.   Musculoskeletal: Negative.   Skin: Negative.   Neurological: Negative.   Endo/Heme/Allergies: Negative.   Psychiatric/Behavioral: Negative.    Maternal Medical History:  Reason for admission: Contractions.   Contractions: Onset was 6-12 hours ago.   Frequency: regular.   Perceived severity is moderate.    Fetal activity: Perceived fetal activity is normal.   Last perceived fetal movement was within the past hour.    Prenatal complications: Bicornuate uterus  Prenatal Complications - Diabetes: none.    Dilation: 2.5 Effacement (%): 70 Station: -2 Exam by:: D. Simpson RNC Blood pressure 116/95, pulse 98, temperature 98.3 F (36.8 C), temperature source Oral, resp. rate 20, height 5\' 2"  (1.575 m),  weight 294 lb (133.4 kg), last menstrual period 03/06/2015, SpO2 99 %. Maternal Exam:  Uterine Assessment: Contraction strength is moderate.  Contraction frequency is regular.   Abdomen: Patient reports no abdominal tenderness. Estimated fetal weight is 7-0.   Fetal presentation: vertex  Introitus: Normal vulva. Normal vagina.  Ferning test: not done.  Nitrazine test: not done. Amniotic fluid character: not assessed.  Pelvis: adequate for delivery.   Cervix: Cervix evaluated by digital exam.     Fetal Exam Fetal Monitor Review: Mode: ultrasound.   Baseline rate: 150's.  Variability: moderate (6-25 bpm).   Pattern: accelerations present, variable decelerations and late decelerations.    Fetal State Assessment: Category II - tracings are indeterminate.     Physical Exam  Constitutional: She is oriented to person, place, and time. She appears well-developed and well-nourished.  HENT:  Head: Normocephalic.  Eyes: Conjunctivae are normal. Pupils are equal, round, and reactive to light.  Neck: Normal range of motion. Neck supple.  Cardiovascular: Normal rate and regular rhythm.   Respiratory: Effort normal and breath sounds normal.  GI: Soft. Bowel sounds are normal.  Genitourinary: Vagina normal and uterus normal.  Musculoskeletal: Normal range of motion.  Neurological: She is alert and oriented to person, place, and time.  Skin: Skin is warm and dry.  Psychiatric: She has a normal mood and affect. Her behavior is normal. Judgment and thought content normal.    Prenatal labs: ABO, Rh: O/POS/-- (03/15  [id number]) Antibody: NEG (03/15 0946) Rubella: 6.88 (03/15 0946) RPR: NON REAC (07/10 0950)  HBsAg: NEGATIVE (03/15 0946)  HIV: NONREACTIVE (07/10 0950)  GBS:   Positive  Assessment/Plan: Preg @ 40.5wks, G1P0, SVE 3/80/-2; EFM shows: FHT's 150's with accels, and one late deccel and variable noted- consulted Dr. Shawnie PonsPratt, will admit to L&D, GBS pos- start antibiotics    Tonya ChalkAshley  Kaleo Walls 12/15/2015, 6:56 AM

## 2015-12-15 NOTE — MAU Note (Signed)
Pt reports leaking fluid x24 hours, contractions.

## 2015-12-15 NOTE — Progress Notes (Signed)
Report to WhitmerNatalie on BS.  Waiting for call back for admit room.

## 2015-12-15 NOTE — Progress Notes (Signed)
Pt evaluated. Comfortable with epidural. 8cm prior to AROM, 9cm after. Copious clear fluid. Category 1 FHTs. Expect vaginal delivery.

## 2015-12-15 NOTE — Progress Notes (Signed)
Notified of pt arrival in MAU and exam. Requested CNM review tracing due to variable decelerations and lack of accelerations. Will review tracing.

## 2015-12-15 NOTE — Anesthesia Pain Management Evaluation Note (Signed)
  CRNA Pain Management Visit Note  Patient: Tonya Walls, 25 y.o., female  "Hello I am a member of the anesthesia team at [facility]. We have an anesthesia team available at all times to provide care throughout the hospital, including epidural management and anesthesia for C-section. I don't know your plan for the delivery whether it a natural birth, water birth, IV sedation, nitrous supplementation, doula or epidural, but we want to meet your pain goals."   1.Was your pain managed to your expectations on prior hospitalizations?   No prior hospitalizations  2.What is your expectation for pain management during this hospitalization?     Epidural  3.How can we help you reach that goal? Epidural in situ.  Record the patient's initial score and the patient's pain goal.   Pain: Patient sleeping - unable to assess.  Patient asleep and does not appear to be in pain or distress.  Pain Goal: Patient sleeping - unable to assess [facility] wants you to be able to say your pain was always managed very well.  Ezriel Boffa L 12/15/2015

## 2015-12-15 NOTE — Anesthesia Procedure Notes (Signed)
Epidural Patient location during procedure: OB Start time: 12/15/2015 12:42 PM End time: 12/15/2015 12:46 PM  Staffing Anesthesiologist: Leilani AbleHATCHETT, Krystall Kruckenberg Performed: anesthesiologist   Preanesthetic Checklist Completed: patient identified, surgical consent, pre-op evaluation, timeout performed, IV checked, risks and benefits discussed and monitors and equipment checked  Epidural Patient position: sitting Prep: site prepped and draped and DuraPrep Patient monitoring: continuous pulse ox and blood pressure Approach: midline Location: L3-L4 Injection technique: LOR air  Needle:  Needle type: Tuohy  Needle gauge: 17 G Needle length: 9 cm and 9 Needle insertion depth: 7 cm Catheter type: closed end flexible Catheter size: 19 Gauge Catheter at skin depth: 13 cm Test dose: negative and Other  Assessment Sensory level: T9 Events: blood not aspirated, injection not painful, no injection resistance, negative IV test and no paresthesia  Additional Notes Reason for block:procedure for pain

## 2015-12-15 NOTE — Anesthesia Preprocedure Evaluation (Signed)
Anesthesia Evaluation  Patient identified by MRN, date of birth, ID band Patient awake    Reviewed: Allergy & Precautions, H&P , NPO status , Patient's Chart, lab work & pertinent test results  Airway Mallampati: III  TM Distance: >3 FB Neck ROM: full    Dental no notable dental hx.    Pulmonary former smoker,    Pulmonary exam normal        Cardiovascular negative cardio ROS Normal cardiovascular exam     Neuro/Psych negative neurological ROS  negative psych ROS   GI/Hepatic negative GI ROS, Neg liver ROS,   Endo/Other  Morbid obesity  Renal/GU negative Renal ROS     Musculoskeletal   Abdominal (+) + obese,   Peds  Hematology negative hematology ROS (+)   Anesthesia Other Findings   Reproductive/Obstetrics (+) Pregnancy                             Anesthesia Physical Anesthesia Plan  ASA:   Anesthesia Plan:    Post-op Pain Management:    Induction:   Airway Management Planned:   Additional Equipment:   Intra-op Plan:   Post-operative Plan:   Informed Consent: I have reviewed the patients History and Physical, chart, labs and discussed the procedure including the risks, benefits and alternatives for the proposed anesthesia with the patient or authorized representative who has indicated his/her understanding and acceptance.     Plan Discussed with:   Anesthesia Plan Comments:         Anesthesia Quick Evaluation

## 2015-12-15 NOTE — Anesthesia Rounding Note (Signed)
  [facility] Epidural Rounding Note  Patient: Tonya Walls, 25 y.o., female  Patient's current pain level: Pain Score: 0-No pain (12/15/15 1400)  Agreed upon pain management level: 4  Epidural intervention: No   Comments:   Tonya Walls 12/15/2015

## 2015-12-16 NOTE — Lactation Note (Signed)
This note was copied from a baby's chart. Lactation Consultation Note  Patient Name: Tonya Walls GEXBM'WToday's Date: 12/16/2015 Reason for consult: Initial assessment   Initial assessment with first time mom of 15 hour old infant. Infant with 2 BF for 10-30 minutes, 3 formula feeds via bottle of 15-30 cc, 1 void and 1 stool in 24 hours preceding this assessment. LATCH score 9 by bedside RN.  Mom voiced that she plans to breast and formula feed. She reports she has put infant to breast a few times and denies pain with latch. Discussed supply and demand and enc mom to place infant to breast with each feeding prior to offering supplement. Mom voiced understanding.   Mom reports she would like to begin pumping. DEBP set up with instructions for set up. Assembling, disassembling and cleaning of pump parts. Enc mom to pump every 2-3 hours for 15 minutes if not putting infant to breast and to feed infant all EBM prior to formula. Mom voiced understanding. Mom with large pendulous compressible breasts and areola and everted nipples. Showed mom how to hand express and colostrum was visible. Enc mom to hand express post pumping.   BF Resources Handout and LC Brochure given, mom informed of IP/OP Services, BF Support Groups and LC phone #. Enc mom to call with questions/concerns prn.      Maternal Data Formula Feeding for Exclusion: Yes Reason for exclusion: Mother's choice to formula feed on admision Has patient been taught Hand Expression?: Yes Does the patient have breastfeeding experience prior to this delivery?: No  Feeding Feeding Type: Breast Fed Length of feed: 10 min  LATCH Score/Interventions                      Lactation Tools Discussed/Used WIC Program: Yes Pump Review: Setup, frequency, and cleaning Initiated by:: Noralee StainSharon Xiomar Crompton, RN, IBCLC Date initiated:: 12/16/15   Consult Status Consult Status: Follow-up Date: 12/17/15 Follow-up type: In-patient    Silas FloodSharon S  Judine Arciniega 12/16/2015, 11:36 AM

## 2015-12-16 NOTE — Anesthesia Postprocedure Evaluation (Signed)
Anesthesia Post Note  Patient: Tonya Walls  Procedure(s) Performed: * No procedures listed *  Patient location during evaluation: Mother Baby Anesthesia Type: Epidural Level of consciousness: awake, awake and alert, oriented and patient cooperative Pain management: pain level controlled Vital Signs Assessment: post-procedure vital signs reviewed and stable Respiratory status: spontaneous breathing, respiratory function stable and nonlabored ventilation Cardiovascular status: stable Postop Assessment: no headache, no backache, no signs of nausea or vomiting and patient able to bend at knees Anesthetic complications: no     Last Vitals:  Vitals:   12/16/15 0310 12/16/15 0529  BP: 118/66 122/72  Pulse: 93 81  Resp: 18 18  Temp: 36.9 C 36.7 C    Last Pain:  Vitals:   12/16/15 0529  TempSrc: Oral  PainSc:    Pain Goal: Patients Stated Pain Goal: 10 (12/15/15 1130)               Ellias Mcelreath L

## 2015-12-16 NOTE — Progress Notes (Signed)
Patient ID: Tonya QualeAngelia L Walls, female   DOB: 03-23-1990, 23 y.o.   MRN: [id number] Post Partum Day 1  Subjective:  Tonya Walls is a 25 y.o. G1P1001 9218w5d s/p SVD.  No acute events overnight.  Pt denies problems with ambulating, voiding or po intake.  She denies nausea or vomiting. Endorsees some back soreness from the epidural. Pain is well controlled.  She has not had flatus. She has not had bowel movement.  Lochia normal  Plan for birth control is IUD, Mirena.  Method of Feeding: currently formula, planning on pumping breast milk today  Objective: BP 122/72   Pulse 81   Temp 98.1 F (36.7 C) (Oral)   Resp 18   Ht 5\' 2"  (1.575 m)   Wt 133.4 kg (294 lb)   LMP 03/06/2015 (Approximate)   SpO2 98%   Breastfeeding? Unknown   BMI 53.77 kg/m   Physical Exam:  General: alert, cooperative and no distress Lochia:normal flow Chest: CTAB Heart: RRR  Abdomen: +BS, soft, nontender Uterine Fundus: firm, below umbilicus  DVT Evaluation: No evidence of DVT seen on physical exam. Extremities: minimal edema   Recent Labs  12/15/15 0725  HGB 11.0*  HCT 33.1*    Assessment/Plan:  ASSESSMENT: Tonya Walls is a 25 y.o. G1P1001 6018w5d ppd #1 s/p NSVD doing well.   Plan for discharge tomorrow, Lactation consult and Contraception IUD/Mirena   LOS: 1 day   Harvin HazelChristina Rizk 12/16/2015, 7:23 AM    OB FELLOW MEDICAL STUDENT NOTE ATTESTATION  I have seen and examined this patient. Note this is a Psychologist, occupationalmedical student note and as such does not necessarily reflect the patient's plan of care. Please see progress note for this date of service.    Jen MowElizabeth Christene Pounds, DO OB Fellow 12/16/2015, 12:04 PM

## 2015-12-16 NOTE — Progress Notes (Signed)
Patient ID: Tonya Walls, female   DOB: [birth year], 25 y.o.   MRN: [id number]  POSTPARTUM PROGRESS NOTE  Post Partum Day #1 Subjective:  Tonya Walls is a 25 y.o. G1P1001 3682w5d s/p SVD.  No acute events overnight.  Pt denies problems with ambulating, voiding or po intake.  She denies nausea or vomiting.  Pain is well controlled.  She has had flatus. She has had bowel movement.  Lochia Small.   Objective: Blood pressure 122/72, pulse 81, temperature 98.1 F (36.7 C), temperature source Oral, resp. rate 18, height 5\' 2"  (1.575 m), weight 294 lb (133.4 kg), last menstrual period 03/06/2015, SpO2 98 %, unknown if currently breastfeeding.  Physical Exam:  General: alert, cooperative and no distress Lochia:normal flow Chest: CTAB Heart: RRR no m/r/g Abdomen: +BS, soft, nontender,  Uterine Fundus: firm, below umbilicus DVT Evaluation: No calf swelling or tenderness Extremities: Trace edema   Recent Labs  12/15/15 0725  HGB 11.0*  HCT 33.1*    Assessment/Plan:  ASSESSMENT: Tonya Walls is a 25 y.o. G1P1001 4182w5d s/p NSVD  Plan for discharge tomorrow, Breastfeeding and Contraception IUD   LOS: 1 day   Jen MowElizabeth Marcea Rojek, DO 12/16/2015, 12:00 PM

## 2015-12-17 LAB — BIRTH TISSUE RECOVERY COLLECTION (PLACENTA DONATION)

## 2015-12-17 MED ORDER — IBUPROFEN 600 MG PO TABS: 600.0000 mg | ORAL_TABLET | Freq: Four times a day (QID) | ORAL | 0 refills | Status: DC

## 2015-12-17 MED ORDER — SENNOSIDES-DOCUSATE SODIUM 8.6-50 MG PO TABS: 2.0000 | ORAL_TABLET | ORAL | 0 refills | Status: DC

## 2015-12-17 NOTE — Discharge Summary (Signed)
OB Discharge Summary     Patient Name: Tonya Walls DOB: 1990-08-13 MRN: [id number]  Date of admission: 12/15/2015 Delivering MD: Ernestina Penna MICHAEL   Date of discharge: 12/17/2015  Admitting diagnosis: 38 WEEKS CTX Intrauterine pregnancy: [redacted]w[redacted]d     Secondary diagnosis:  Active Problems:   Pregnancy  Additional problems: bicornate uterus     Discharge diagnosis: Term Pregnancy Delivered                                                                                                Post partum procedures:none  Augmentation: AROM  Complications: None  Hospital course:  Onset of Labor With Vaginal Delivery     25 y.o. yo G1P1001 at [redacted]w[redacted]d was admitted in Active Labor on 12/15/2015. Patient had an uncomplicated labor course as follows:  Membrane Rupture Time/Date: 3:09 PM ,12/15/2015   Intrapartum Procedures: Episiotomy: None [1]                                         Lacerations:  2nd degree [3];Perineal [11]  Patient had a delivery of a Viable infant. 12/15/2015  Information for the patient's newborn:  Zorana, [place] [[id number]]  Delivery Method: Vag-Spont    Pateint had an uncomplicated postpartum course.  She is ambulating, tolerating a regular diet, passing flatus, and urinating well. Patient is discharged home in stable condition on 12/17/15.    Physical exam Vitals:   12/16/15 0310 12/16/15 0529 12/16/15 1851 12/17/15 0532  BP: 118/66 122/72 120/76 120/70  Pulse: 93 81 86 80  Resp: 18 18 18 18   Temp: 98.5 F (36.9 C) 98.1 F (36.7 C) 98.2 F (36.8 C) 98.1 F (36.7 C)  TempSrc: Oral Oral Oral Oral  SpO2:      Weight:      Height:       General: alert, cooperative and no distress Lochia: appropriate Uterine Fundus: firm Incision: N/A DVT Evaluation: No evidence of DVT seen on physical exam. Labs: Lab Results  Component Value Date   WBC 11.0 (H) 12/15/2015   HGB 11.0 (L) 12/15/2015   HCT 33.1 (L) 12/15/2015   MCV 87.1 12/15/2015   PLT 267  12/15/2015   CMP Latest Ref Rng & Units 07/28/2015  Glucose 65 - 99 mg/dL 782(N)  BUN 6 - 20 mg/dL 13  Creatinine 5.62 - 1.30 mg/dL 8.65(H)  Sodium 846 - 962 mmol/L 135  Potassium 3.5 - 5.1 mmol/L 3.6  Chloride 101 - 111 mmol/L 107  CO2 22 - 32 mmol/L 21(L)  Calcium 8.9 - 10.3 mg/dL 9.5(M)  Total Protein 6.5 - 8.1 g/dL 6.7  Total Bilirubin 0.3 - 1.2 mg/dL 0.6  Alkaline Phos 38 - 126 U/L 62  AST 15 - 41 U/L 16  ALT 14 - 54 U/L 19    Discharge instruction: per After Visit Summary and "Baby and Me Booklet".  After visit meds:    Medication List    TAKE these medications   acetaminophen 500 MG tablet Commonly  known as:  TYLENOL Take 1,000 mg by mouth every 6 (six) hours as needed for mild pain, moderate pain or headache.   calcium carbonate 500 MG chewable tablet Commonly known as:  TUMS - dosed in mg elemental calcium Chew 1 tablet by mouth as needed for indigestion or heartburn.   ibuprofen 600 MG tablet Commonly known as:  ADVIL,MOTRIN Take 1 tablet (600 mg total) by mouth every 6 (six) hours.   prenatal multivitamin Tabs tablet Take 1 tablet by mouth daily.   senna-docusate 8.6-50 MG tablet Commonly known as:  Senokot-S Take 2 tablets by mouth daily. Start taking on:  12/18/2015       Diet: routine diet  Activity: Advance as tolerated. Pelvic rest for 6 weeks.   Outpatient follow up:6 weeks Follow up Appt:Future Appointments Date Time Provider Department Center  01/20/2016 3:20 PM Aviva SignsMarie L Williams, CNM [facility]   Follow up Visit:No Follow-up on file.  Postpartum contraception: Nexplanon  Newborn Data: Live born female  Birth Weight: 7 lb 10.9 oz (3484 g) APGAR: 8, 9  Baby Feeding: Bottle Disposition:home with mother   12/17/2015 Tillman SersAngela C Riccio, DO  OB FELLOW DISCHARGE ATTESTATION  I have seen and examined this patient and agree with above documentation in the resident's note.   Silvano BilisNoah B Papa [place], MD 2:14 PM

## 2015-12-17 NOTE — Discharge Instructions (Signed)

## 2015-12-17 NOTE — Progress Notes (Signed)
Patient ID: Tonya QualeAngelia L Juarbe, female   DOB: 1990-12-02, 25 y.o.   MRN: [id number]  POSTPARTUM PROGRESS NOTE  Post Partum Day 2 Subjective:  Tonya Walls is a 25 y.o. G1P1001 450w5d s/p SVD.  No acute events overnight. Pain is well controlled.  She has had flatus. She has had bowel movement.  Lochia minimal.   Objective: Blood pressure 120/70, pulse 80, temperature 98.1 F (36.7 C), temperature source Oral, resp. rate 18, height 5\' 2"  (1.575 m), weight 133.4 kg (294 lb), last menstrual period 03/06/2015, SpO2 98 %, unknown if currently breastfeeding.  Physical Exam:  General: alert, cooperative and no distress Lochia:normal flow Chest: CTAB Heart: RRR no m/r/g Abdomen: +BS, soft, nontender,  Uterine Fundus: firm, below umbilicus  DVT Evaluation: No calf swelling or tenderness    Recent Labs  12/15/15 0725  HGB 11.0*  HCT 33.1*    Assessment/Plan:  ASSESSMENT: Tonya Walls is a 25 y.o. G1P1001 3450w5d s/p SVD who is doing well.  Discharge home and Contraception IUD   LOS: 2 days   Harvin Hazelhristina Rizk, Medical Student 12/17/2015, 7:17 AM    OB FELLOW MEDICAL STUDENT NOTE ATTESTATION  I have seen and examined this patient. Note this is a Psychologist, occupationalmedical student note and as such does not necessarily reflect the patient's plan of care. Please see progress note for this date of service.    Jen MowElizabeth Letonya Mangels, DO OB Fellow 12/17/2015, 11:06 PM

## 2015-12-21 ENCOUNTER — Inpatient Hospital Stay (HOSPITAL_COMMUNITY): Payer: Medicaid Other

## 2015-12-21 ENCOUNTER — Inpatient Hospital Stay (HOSPITAL_COMMUNITY)
Admission: AD | Admit: 2015-12-21 | Discharge: 2015-12-22 | Disposition: A | Discharge status: Home / Self Care | Payer: Medicaid Other | Attending: Obstetrics & Gynecology | Admitting: Obstetrics & Gynecology

## 2015-12-21 DIAGNOSIS — O99210 Obesity complicating pregnancy, unspecified trimester: Secondary | ICD-10-CM

## 2015-12-21 DIAGNOSIS — Z6841 Body Mass Index (BMI) 40.0 and over, adult: Secondary | ICD-10-CM | POA: Insufficient documentation

## 2015-12-21 DIAGNOSIS — Z833 Family history of diabetes mellitus: Secondary | ICD-10-CM | POA: Insufficient documentation

## 2015-12-21 DIAGNOSIS — O99215 Obesity complicating the puerperium: Secondary | ICD-10-CM | POA: Insufficient documentation

## 2015-12-21 DIAGNOSIS — R102 Pelvic and perineal pain: Secondary | ICD-10-CM | POA: Insufficient documentation

## 2015-12-21 DIAGNOSIS — Z87891 Personal history of nicotine dependence: Secondary | ICD-10-CM | POA: Insufficient documentation

## 2015-12-21 NOTE — MAU Note (Signed)
Pt presents complaining of vaginal bleeding since 11pm. States she saturated a pad since then. States she feels like something is hanging out of her vagina but doesn't know what it looks like. Lower abdominal cramping that has been going on all day.

## 2015-12-22 ENCOUNTER — Inpatient Hospital Stay (HOSPITAL_COMMUNITY): Payer: Medicaid Other

## 2015-12-22 DIAGNOSIS — R102 Pelvic and perineal pain: Secondary | ICD-10-CM | POA: Diagnosis not present

## 2015-12-22 DIAGNOSIS — Z6841 Body Mass Index (BMI) 40.0 and over, adult: Secondary | ICD-10-CM | POA: Diagnosis not present

## 2015-12-22 DIAGNOSIS — O99215 Obesity complicating the puerperium: Secondary | ICD-10-CM | POA: Diagnosis not present

## 2015-12-22 DIAGNOSIS — Z833 Family history of diabetes mellitus: Secondary | ICD-10-CM | POA: Diagnosis not present

## 2015-12-22 DIAGNOSIS — Z87891 Personal history of nicotine dependence: Secondary | ICD-10-CM | POA: Diagnosis not present

## 2015-12-22 LAB — CBC
HEMATOCRIT: 27.2 % — AB (ref 36.0–46.0)
HEMOGLOBIN: 8.9 g/dL — AB (ref 12.0–15.0)
MCH: 28.9 pg (ref 26.0–34.0)
MCHC: 32.7 g/dL (ref 30.0–36.0)
MCV: 88.3 fL (ref 78.0–100.0)
Platelets: 404 10*3/uL — ABNORMAL HIGH (ref 150–400)
RBC: 3.08 MIL/uL — ABNORMAL LOW (ref 3.87–5.11)
RDW: 14.4 % (ref 11.5–15.5)
WBC: 10.8 10*3/uL — ABNORMAL HIGH (ref 4.0–10.5)

## 2015-12-22 MED ORDER — MISOPROSTOL 200 MCG PO TABS
800.0000 ug | ORAL_TABLET | Freq: Once | ORAL | Status: DC
Start: 2015-12-22 — End: 2015-12-22
  Filled 2015-12-22: qty 4

## 2015-12-22 MED ORDER — IBUPROFEN 800 MG PO TABS: 800.0000 mg | ORAL_TABLET | Freq: Three times a day (TID) | ORAL | 0 refills | Status: DC | PRN

## 2015-12-22 MED ORDER — MISOPROSTOL 200 MCG PO TABS
800.0000 ug | ORAL_TABLET | Freq: Once | ORAL | Status: AC
  Administered 2015-12-22: 800 ug via BUCCAL

## 2015-12-22 MED ORDER — MISOPROSTOL 200 MCG PO TABS: 800.0000 ug | ORAL_TABLET | Freq: Two times a day (BID) | ORAL | 0 refills | Status: DC

## 2015-12-22 MED ORDER — DIPHENHYDRAMINE HCL 25 MG PO CAPS: 50.0000 mg | ORAL_CAPSULE | Freq: Once | ORAL | Status: DC

## 2015-12-22 NOTE — Discharge Instructions (Signed)

## 2015-12-22 NOTE — MAU Provider Note (Signed)
History     CSN: [id number]  Arrival date and time: 12/21/15 2351   First Provider Initiated Contact with Patient 12/22/15 0003      Chief Complaint  Patient presents with  . Vaginal Bleeding   Tonya Walls is a 25 y.o. G1P1001 who is S/P NSVD on 12/15/15. She states that just prior to arrival she had a large gush of blood, and "I passed something that looked like it was supposed to come out with the baby." She has been cramping. She states that up until the large gush of blood she has had normal bleeding.    Vaginal Bleeding  The patient's primary symptoms include pelvic pain and vaginal bleeding. This is a new problem. The current episode started today. The problem occurs constantly. The problem has been unchanged. The pain is moderate. The problem affects both sides. Pregnant now: Postpartum  Associated symptoms include abdominal pain. Pertinent negatives include no chills, constipation, diarrhea, dysuria, fever, frequency, nausea, urgency or vomiting. She has been passing tissue. Nothing aggravates the symptoms. She has tried nothing for the symptoms.    Past Medical History:  Diagnosis Date  . Bicornuate uterus   . Obesity, Class III, BMI 40-49.9 (morbid obesity) (HCC)     Past Surgical History:  Procedure Laterality Date  . NO PAST SURGERIES      Family History  Problem Relation Age of Onset  . Diabetes Father     Social History  Substance Use Topics  . Smoking status: Former Smoker    Types: Cigarettes    Quit date: 11/13/2012  . Smokeless tobacco: Never Used  . Alcohol use No    Allergies: No Known Allergies  Prescriptions Prior to Admission  Medication Sig Dispense Refill Last Dose  . acetaminophen (TYLENOL) 500 MG tablet Take 1,000 mg by mouth every 6 (six) hours as needed for mild pain, moderate pain or headache.   12/14/2015 at 2200  . calcium carbonate (TUMS - DOSED IN MG ELEMENTAL CALCIUM) 500 MG chewable tablet Chew 1 tablet by mouth as needed for  indigestion or heartburn.   12/14/2015 at Unknown time  . ibuprofen (ADVIL,MOTRIN) 600 MG tablet Take 1 tablet (600 mg total) by mouth every 6 (six) hours. 30 tablet 0   . Prenatal Vit-Fe Fumarate-FA (PRENATAL MULTIVITAMIN) TABS tablet Take 1 tablet by mouth daily.   Past Week at Unknown time  . senna-docusate (SENOKOT-S) 8.6-50 MG tablet Take 2 tablets by mouth daily. 30 tablet 0     Review of Systems  Constitutional: Negative for chills and fever.  Gastrointestinal: Positive for abdominal pain. Negative for constipation, diarrhea, nausea and vomiting.  Genitourinary: Positive for pelvic pain and vaginal bleeding. Negative for dysuria, frequency and urgency.   Physical Exam   Blood pressure 122/65, pulse 92, temperature 98 F (36.7 C), temperature source Oral, resp. rate 18, last menstrual period 03/06/2015, unknown if currently breastfeeding.  Physical Exam  Nursing note and vitals reviewed. Constitutional: She is oriented to person, place, and time. She appears well-developed and well-nourished. No distress.  HENT:  Head: Normocephalic.  Cardiovascular: Normal rate.   Respiratory: Effort normal.  GI: Soft. There is no tenderness. There is no rebound.  Genitourinary:  Genitourinary Comments: Large piece of membrane teased from the vagina. Bleeding is minimal after removal of tissue.   Neurological: She is alert and oriented to person, place, and time.  Skin: Skin is warm and dry.  Psychiatric: She has a normal mood and affect.   Results for  orders placed or performed during the hospital encounter of 12/21/15 (from the past 24 hour(s))  CBC     Status: Abnormal (Preliminary result)   Collection Time: 12/22/15 12:14 AM  Result Value Ref Range   WBC PENDING 4.0 - 10.5 K/uL   RBC 3.08 (L) 3.87 - 5.11 MIL/uL   Hemoglobin 8.9 (L) 12.0 - 15.0 g/dL   HCT 16.127.2 (L) 09.636.0 - 04.546.0 %   MCV 88.3 78.0 - 100.0 fL   MCH 28.9 26.0 - 34.0 pg   MCHC 32.7 30.0 - 36.0 g/dL   RDW 40.914.4 81.111.5 - 91.415.5 %    Platelets 404 (H) 150 - 400 K/uL  [place] Pelvis Complete  Result Date: 12/22/2015 CLINICAL DATA:  Heavy vaginal bleeding.  Seven days postpartum. EXAM: TRANSABDOMINAL ULTRASOUND OF PELVIS TECHNIQUE: Transabdominal ultrasound examination of the pelvis was performed including evaluation of the uterus, ovaries, adnexal regions, and pelvic cul-de-sac. COMPARISON:  None. FINDINGS: Uterus Measurements: 18.2 x 9.3 x 12.6 cm. There is postpartum enlargement of the uterus Endometrium Thickness: 2.4 cm. At the right anterior aspect of the upper endometrial cavity, there is a heterogeneous echotexture area with associated increased vascularity, measuring approximately 2.5 cm. Right ovary Not visualized. Left ovary Not visualized. Other findings:  No abnormal free fluid. IMPRESSION: Heterogeneous, vascular area within the right anterior aspect of the endometrial cavity, favored to represent retained products of conception. Electronically Signed   By: Deatra RobinsonKevin  Herman M.D.   On: 12/22/2015 01:44    MAU Course  Procedures  MDM Will check CBC, and Pelvic US for any other retained products.  [id number]: D/W Dr. Despina HiddenEure will give dose of cytotec here, and send home with 2 more doses to take q 12 hours PRN    Assessment and Plan   1. Retained products of conception after delivery without hemorrhage   2. Obesity in pregnancy, antepartum    DC home Comfort measures reviewed  Bleeding precautions RX: ibuprofen PRN #30, Cytotec 4 tabs q 12 hours x 2 doses  Return to MAU as needed FU with OB as planned  Follow-up Information    [facility]   Specialty:  Obstetrics and Gynecology Contact information: [address] [place] [id number] [phone]          Tonya CrookHogan, Tonya Donovan 12/22/2015, 12:04 AM

## 2016-01-20 ENCOUNTER — Ambulatory Visit: Payer: Self-pay | Admitting: Advanced Practice Midwife

## 2016-02-02 ENCOUNTER — Ambulatory Visit (INDEPENDENT_AMBULATORY_CARE_PROVIDER_SITE_OTHER): Payer: Medicaid Other | Admitting: Student

## 2016-02-02 DIAGNOSIS — Z3042 Encounter for surveillance of injectable contraceptive: Secondary | ICD-10-CM | POA: Diagnosis present

## 2016-02-02 DIAGNOSIS — Z30013 Encounter for initial prescription of injectable contraceptive: Secondary | ICD-10-CM

## 2016-02-02 LAB — POCT PREGNANCY, URINE: Preg Test, Ur: NEGATIVE

## 2016-02-02 MED ORDER — MEDROXYPROGESTERONE ACETATE 150 MG/ML IM SUSP
150.0000 mg | Freq: Once | INTRAMUSCULAR | Status: AC
  Administered 2016-02-02: 150 mg via INTRAMUSCULAR

## 2016-02-02 NOTE — Patient Instructions (Signed)
Hormonal Contraception Information Introduction Estrogen and progesterone (progestin) are hormones used in many forms of birth control (contraception). These two hormones make up most hormonal contraceptives. Hormonal contraceptives use either:  A combination of estrogen hormone and progesterone hormone in one of these forms:  Pill. Pills come in various combinations of active hormone pills and nonhormonal pills. Different combinations of pills may give you a period once a month, once every 3 months, or no period at all. It is important to take the pills the same time each day.  Patch. The patch is placed on the lower abdomen every week for 3 weeks. On the fourth week, the patch is not placed.  Vaginal ring. The ring is placed in the vagina and left there for 3 weeks. It is then removed for 1 week.  Progesterone alone in one of these forms:  Pill. Hormone pills are taken every day of the cycle.  Intrauterine device (IUD). The IUD is inserted during a menstrual period and removed or replaced every 5 years or sooner.  Implant. Plastic rods are placed under the skin of the upper arm. They are removed or replaced every 3 years or sooner.  Injection. The injection is given once every 90 days. Pregnancy can still occur with any of these hormonal contraceptive methods. If you have any suspicion that you might be pregnant, take a pregnancy test and talk to your health care provider. Estrogen and progesterone contraceptives Estrogen and progesterone contraceptives can prevent pregnancy by:  Stopping the release of an egg (ovulation).  Thickening the mucus of the cervix, making it difficult for sperm to enter the uterus.  Changing the lining of the uterus. This change makes it more difficult for an egg to implant. Progesterone contraceptives Progesterone-only contraceptives can prevent pregnancy by:  Blocking ovulation. This occurs in many women, but some women will continue to  ovulate.  Preventing the entry of sperm into the uterus by keeping the cervical mucus thick and sticky.  Changing the lining of the uterus. This change makes it more difficult for an egg to implant. Side effects Talk to your health care provider about what side effects may affect you. If you develop persistent side effects or if the effects are severe, talk to your health care provider.  Estrogen. Side effects from estrogen occur more often in the first 2-3 months. They include:  Progesterone. Side effects of progesterone can vary. They include: Questions to ask This information is not intended to replace advice given to you by your health care provider. Make sure you discuss any questions you have with your health care provider. Document Released: 03/21/2007 Document Revised: 12/03/2015 Document Reviewed: 08/13/2012  2017 Elsevier  

## 2016-02-02 NOTE — Progress Notes (Signed)
Subjective:     Tonya Walls is a 25 y.o. female who presents for a postpartum visit. She is 6 weeks postpartum following a spontaneous vaginal delivery. I have fully reviewed the prenatal and intrapartum course. The delivery was at 40 gestational weeks. Outcome: vaginal. Anesthesia: Epidual. Postpartum course has been uncomplicated. Baby's course has been uncomplicted. Baby is feeding by bottle. Bleeding not at this time. Bowel function is normal. Bladder function is normal. Patient is sexually active. Contraception method is none at this time but desires depo-provera. Postpartum depression screening: negative  The following portions of the patient's history were reviewed and updated as appropriate: allergies, current medications, past family history, past medical history, past social history, past surgical history and problem list.  Review of Systems A comprehensive review of systems was negative.   Objective:    BP 133/84   Pulse 97   Wt 280 lb (127 kg)   BMI 51.21 kg/m   General:  alert, cooperative, appears stated age and moderately obese  Lungs: clear to auscultation bilaterally  Heart:  regular rate and rhythm, S1, S2 normal, no murmur, click, rub or gallop  Abdomen: soft, non-tender; bowel sounds normal; no masses,  no organomegaly       Assessment:     Normal 6 wk postpartum exam. Pap smear not done at today's visit.   Plan:    1. Contraception: Depo-Provera injections 2. Follow up in: 3 months for next injection or as needed.

## 2016-04-19 ENCOUNTER — Ambulatory Visit: Payer: Self-pay

## 2016-04-22 ENCOUNTER — Ambulatory Visit: Payer: Self-pay

## 2016-05-05 ENCOUNTER — Ambulatory Visit: Payer: Self-pay | Admitting: Student

## 2017-06-14 ENCOUNTER — Encounter: Payer: Self-pay | Admitting: *Deleted

## 2017-08-31 ENCOUNTER — Ambulatory Visit (HOSPITAL_COMMUNITY)
Admission: EM | Admit: 2017-08-31 | Discharge: 2017-08-31 | Disposition: A | Discharge status: Home / Self Care | Payer: Self-pay | Attending: Internal Medicine | Admitting: Internal Medicine

## 2017-08-31 ENCOUNTER — Encounter (HOSPITAL_COMMUNITY): Payer: Self-pay | Admitting: Family Medicine

## 2017-08-31 DIAGNOSIS — Z87891 Personal history of nicotine dependence: Secondary | ICD-10-CM | POA: Insufficient documentation

## 2017-08-31 DIAGNOSIS — Z79899 Other long term (current) drug therapy: Secondary | ICD-10-CM | POA: Insufficient documentation

## 2017-08-31 DIAGNOSIS — J069 Acute upper respiratory infection, unspecified: Secondary | ICD-10-CM

## 2017-08-31 DIAGNOSIS — J029 Acute pharyngitis, unspecified: Secondary | ICD-10-CM | POA: Insufficient documentation

## 2017-08-31 DIAGNOSIS — B9789 Other viral agents as the cause of diseases classified elsewhere: Secondary | ICD-10-CM

## 2017-08-31 LAB — POCT INFECTIOUS MONO SCREEN: MONO SCREEN: NEGATIVE

## 2017-08-31 LAB — POCT RAPID STREP A: STREPTOCOCCUS, GROUP A SCREEN (DIRECT): NEGATIVE

## 2017-08-31 MED ORDER — LIDOCAINE VISCOUS HCL 2 % MT SOLN: 15.0000 mL | OROMUCOSAL | 0 refills | Status: DC | PRN

## 2017-08-31 MED ORDER — CETIRIZINE-PSEUDOEPHEDRINE ER 5-120 MG PO TB12: 1.0000 | ORAL_TABLET | Freq: Every day | ORAL | 0 refills | Status: DC

## 2017-08-31 NOTE — ED Triage Notes (Signed)
Pt here for sore throat and tonsillar swelling for a few weeks and getting worse. She has been taking ibuprofen but not better. sts hard to talk and she has missed the last 2 days of work.

## 2017-08-31 NOTE — Discharge Instructions (Addendum)
Strep test negative, will send out for culture and we will call you with results Mono negative Get plenty of rest and push fluids Zyrtec D prescribed.  Do not take if you are breast feeding Viscous lidocaine prescribed Continue OTC medications as needed  Follow up with PCP if symptoms persists Return or go to ER if patient has any new or worsening symptoms

## 2017-08-31 NOTE — ED Provider Notes (Signed)
[facility]   [id number] 08/31/17 Arrival Time: 1801  SUBJECTIVE: History from: patient.  Tonya Walls is a 27 y.o. female who presents with abrupt onset of sore throat that began a 1-2 of weeks ago.  Admits to positive sick exposure.  Has tried ibuprofen with temporary relief.  Symptoms are made worse with swallowing.  Reports previous symptoms in the past.   Complains nausea, congestion, rhinorrhea, and productive cough.  Denies fever, chills, fatigue, sinus pain, rhinorrhea, SOB,  wheezing, chest pain, changes in bowel or bladder habits.    ROS: As per HPI.  Past Medical History:  Diagnosis Date  . Bicornuate uterus   . Obesity, Class III, BMI 40-49.9 (morbid obesity) (HCC)    Past Surgical History:  Procedure Laterality Date  . NO PAST SURGERIES     No Known Allergies No current facility-administered medications on file prior to encounter.    Current Outpatient Medications on File Prior to Encounter  Medication Sig Dispense Refill  . acetaminophen (TYLENOL) 500 MG tablet Take 1,000 mg by mouth every 6 (six) hours as needed for mild pain, moderate pain or headache.    . calcium carbonate (TUMS - DOSED IN MG ELEMENTAL CALCIUM) 500 MG chewable tablet Chew 1 tablet by mouth as needed for indigestion or heartburn.    Marland Kitchen ibuprofen (ADVIL,MOTRIN) 800 MG tablet Take 1 tablet (800 mg total) by mouth every 8 (eight) hours as needed. 30 tablet 0  . misoprostol (CYTOTEC) 200 MCG tablet Take 4 tablets (800 mcg total) by mouth every 12 (twelve) hours. 8 tablet 0  . Prenatal Vit-Fe Fumarate-FA (PRENATAL MULTIVITAMIN) TABS tablet Take 1 tablet by mouth daily.    Marland Kitchen senna-docusate (SENOKOT-S) 8.6-50 MG tablet Take 2 tablets by mouth daily. 30 tablet 0  . [DISCONTINUED] metoCLOPramide (REGLAN) 10 MG tablet Take 1 tablet (10 mg total) by mouth every 6 (six) hours. 30 tablet 0  . [DISCONTINUED] omeprazole (PRILOSEC) 20 MG capsule Take 1 capsule (20 mg total) by mouth daily. (Patient not  taking: Reported on 04/04/2015) 30 capsule 3   Social History   Socioeconomic History  . Marital status: Single    Spouse name: Not on file  . Number of children: Not on file  . Years of education: Not on file  . Highest education level: Not on file  Occupational History  . Not on file  Social Needs  . Financial resource strain: Not on file  . Food insecurity:    Worry: Not on file    Inability: Not on file  . Transportation needs:    Medical: Not on file    Non-medical: Not on file  Tobacco Use  . Smoking status: Former Smoker    Types: Cigarettes    Last attempt to quit: 11/13/2012    Years since quitting: 4.8  . Smokeless tobacco: Never Used  Substance and Sexual Activity  . Alcohol use: No  . Drug use: No  . Sexual activity: Yes    Birth control/protection: None  Lifestyle  . Physical activity:    Days per week: Not on file    Minutes per session: Not on file  . Stress: Not on file  Relationships  . Social connections:    Talks on phone: Not on file    Gets together: Not on file    Attends religious service: Not on file    Active member of club or organization: Not on file    Attends meetings of clubs or organizations: Not on  file    Relationship status: Not on file  . Intimate partner violence:    Fear of current or ex partner: Not on file    Emotionally abused: Not on file    Physically abused: Not on file    Forced sexual activity: Not on file  Other Topics Concern  . Not on file  Social History Narrative  . Not on file   Family History  Problem Relation Age of Onset  . Diabetes Father     OBJECTIVE:  Vitals:   08/31/17 1827  BP: 135/65  Pulse: (!) 103  Resp: 18  Temp: 98.8 F (37.1 C)  SpO2: 100%     General appearance: alert; active; nontoxic appearance HEENT: Ears: EACs clear, TMs pearly gray with visible cone of light, without erythema; Eyes: PERRL, EOMI grossly; Sinuses nontender to palpation; Nose: clear rhinorrhea; Throat: oropharynx  mildly erythematous, tonsils 1+ without white tonsillar exudates, uvula midline Neck: mild cervical LAD Lungs: CTA bilaterally without adventitious breath sounds Heart: regular rate and rhythm.  Radial pulses 2+ symmetrical bilaterally Skin: warm and dry Psychological: alert and cooperative; normal mood and affect  Labs:  Results for orders placed or performed during the hospital encounter of 08/31/17 (from the past 24 hour(s))  POCT rapid strep A [facility]     Status: None   Collection Time: 08/31/17  6:53 PM  Result Value Ref Range   Streptococcus, Group A Screen (Direct) NEGATIVE NEGATIVE  Infectious mono screen, POC     Status: None   Collection Time: 08/31/17  7:09 PM  Result Value Ref Range   Mono Screen NEGATIVE NEGATIVE    ASSESSMENT & PLAN:  1. Viral pharyngitis   2. Viral upper respiratory tract infection     Meds ordered this encounter  Medications  . cetirizine-pseudoephedrine (ZYRTEC-D) 5-120 MG tablet    Sig: Take 1 tablet by mouth daily.    Dispense:  30 tablet    Refill:  0    Order Specific Question:   Supervising Provider    Answer:   Isa RankinMURRAY, LAURA WILSON [phone][[id number]]  . lidocaine (XYLOCAINE) 2 % solution    Sig: Use as directed 15 mLs in the mouth or throat as needed for mouth pain.    Dispense:  100 mL    Refill:  0    Order Specific Question:   Supervising Provider    Answer:   Isa RankinMURRAY, LAURA WILSON [[id number]]    Strep test negative, will send out for culture and we will call you with results Mono negative Get plenty of rest and push fluids Zyrtec D prescribed Viscous lidocaine prescribed Continue OTC medications as needed  Follow up with PCP if symptoms persists Return or go to ER if patient has any new or worsening symptoms   Reviewed expectations re: course of current medical issues. Questions answered. Outlined signs and symptoms indicating need for more acute intervention. Patient verbalized understanding. After Visit Summary  given.        Rennis HardingWurst, Charistopher Rumble, PA-C 08/31/17 1926

## 2017-09-03 LAB — CULTURE, GROUP A STREP (THRC)

## 2017-09-09 IMAGING — US US MFM FETAL BPP W/O NON-STRESS
1 series · 8 of 8 positions shown · non-contrast
Comparison: none

[Series 1: us mfm fetal bpp w/o non-stress · 8 acquisitions, 8 frames shown]
[im 1/8]
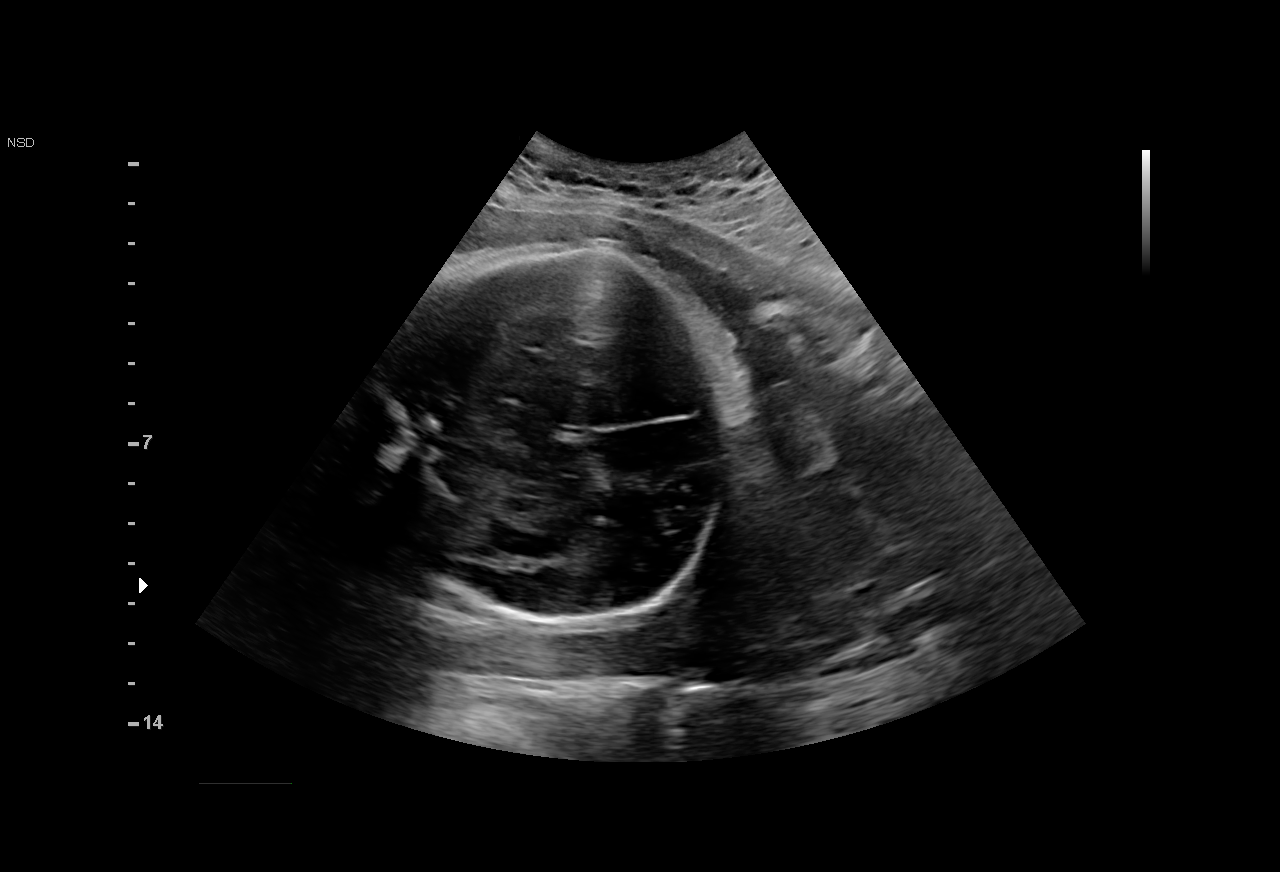
[im 2/8]
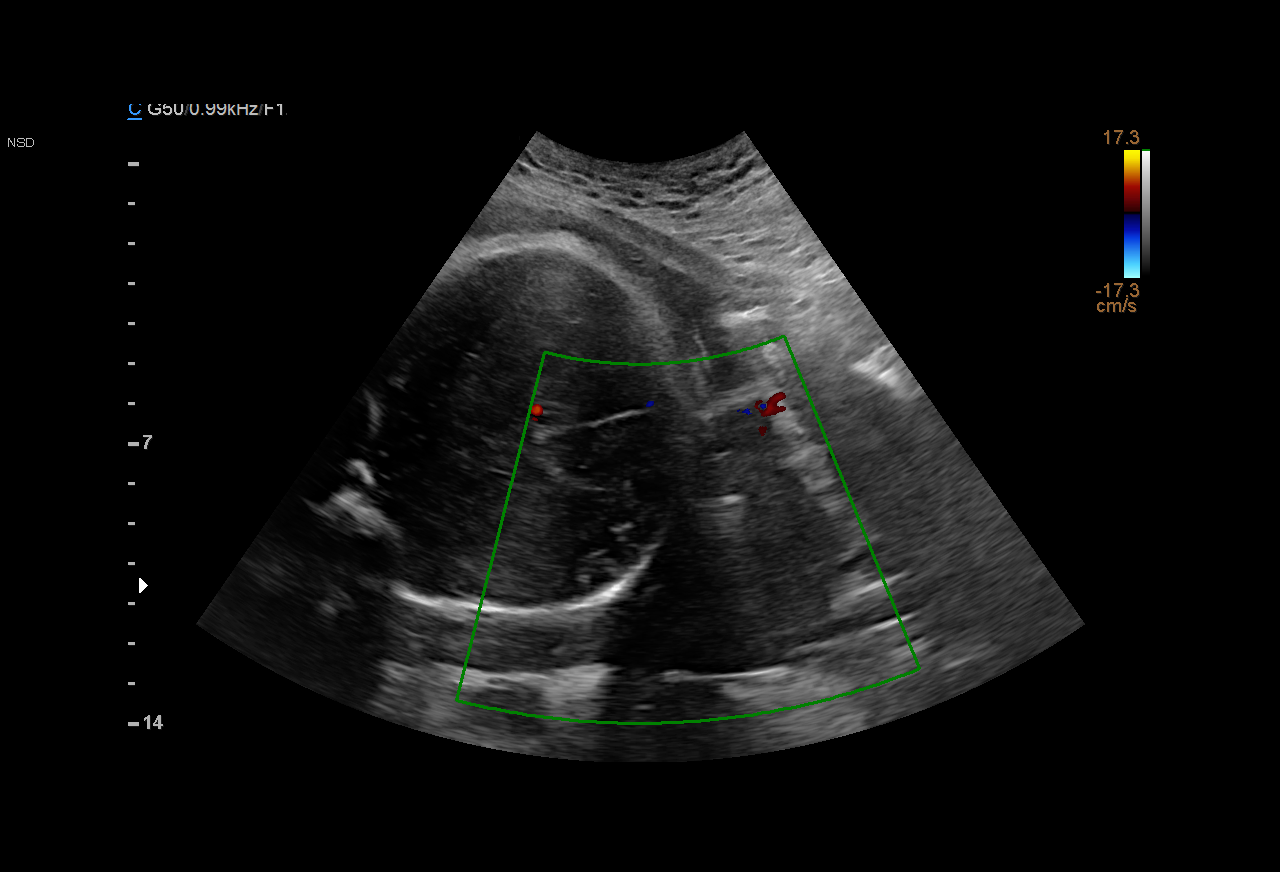
[im 3/8]
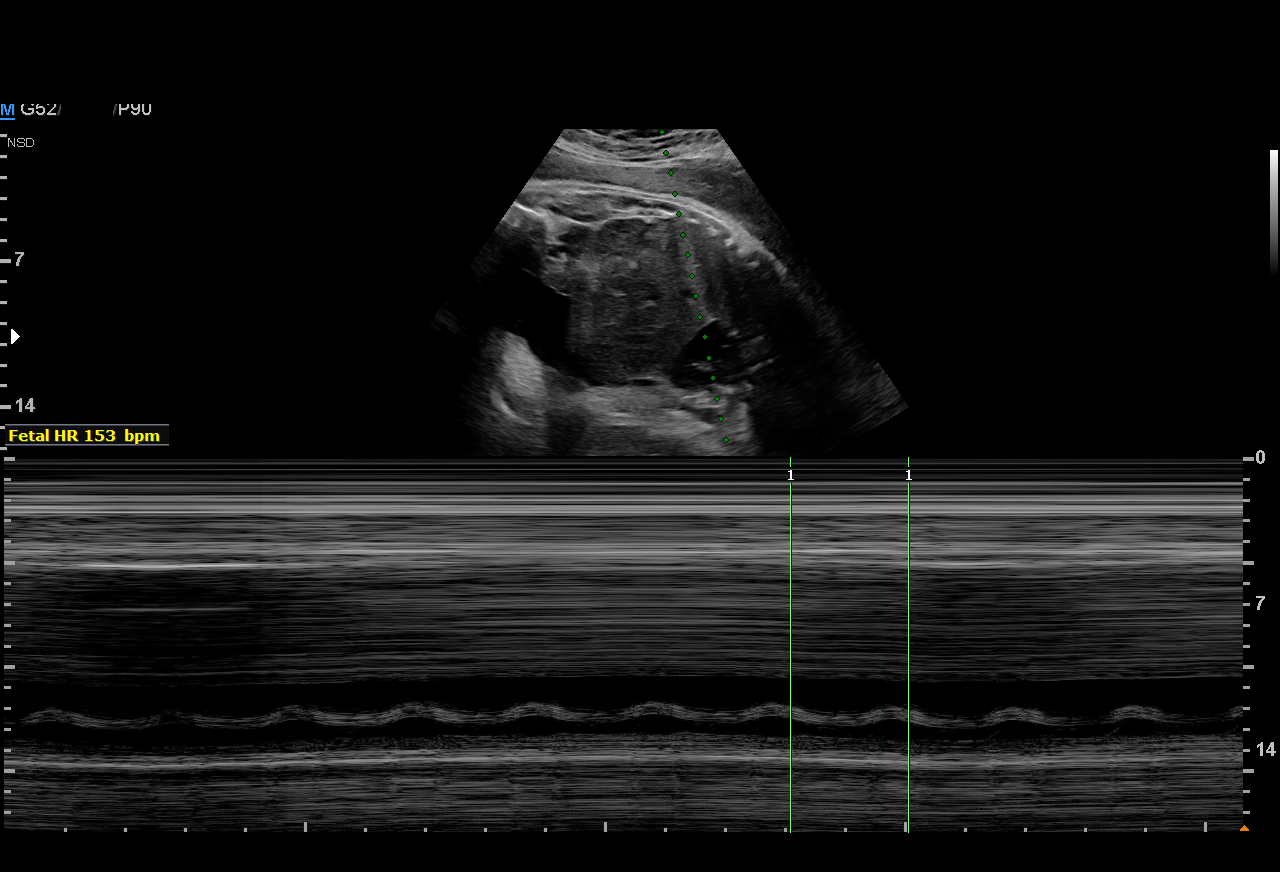
[im 4/8]
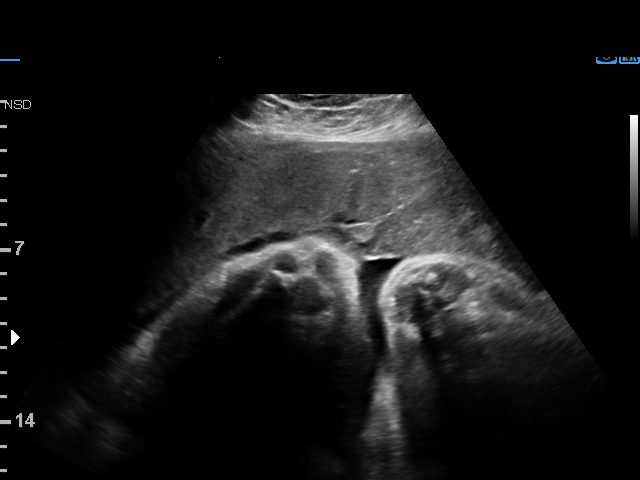
[im 5/8]
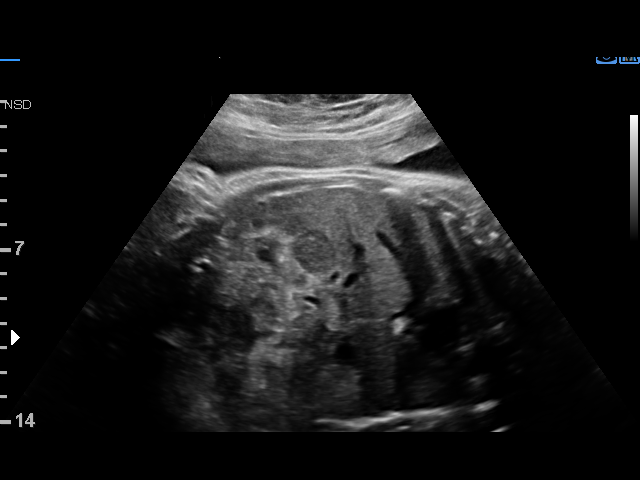
[im 6/8]
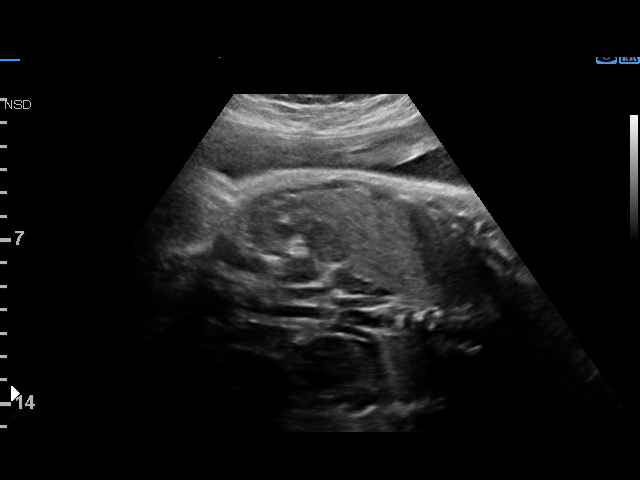
[im 7/8]
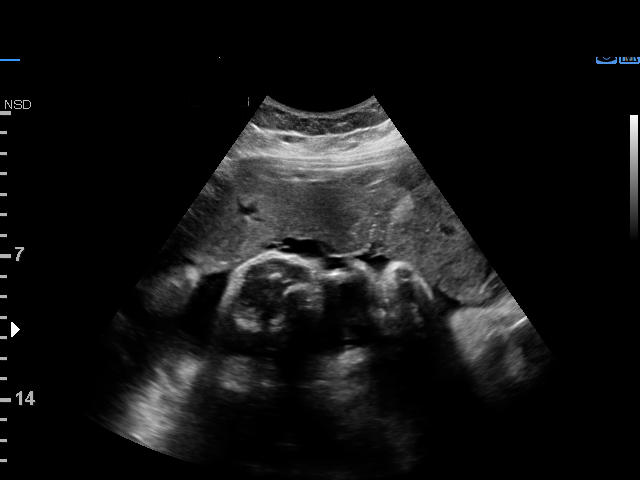
[im 8/8]
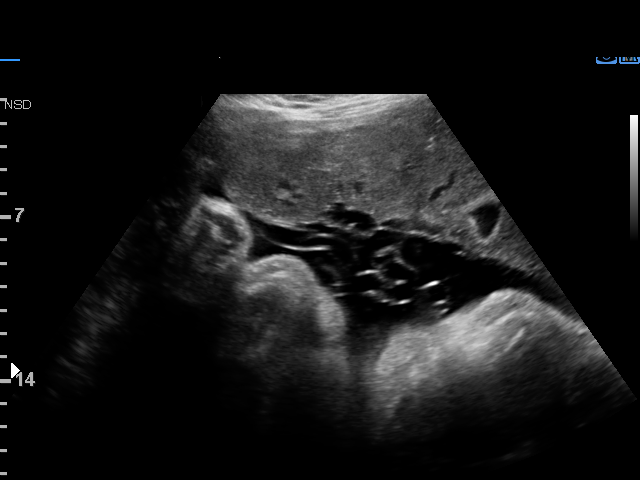

[8 of 8 positions shown; findings below may reference images not displayed]

Attending:        Chino Doherty       Secondary Phy.:    [facility]-
MAU/Triage

Indications

40 weeks gestation of pregnancy
Postdate pregnancy (40-42 weeks)
Non-reactive NST
Maternal morbid obesity
Bicornuate uterus, 3RD trimester; pregnancy
in right horn
Uterine size-date discrepancy, third trimester
OB History

Gravidity:    1
Fetal Evaluation

Num Of Fetuses:     1
Fetal Heart         153
Rate(bpm):
Cardiac Activity:   Observed
Presentation:       Cephalic

Amniotic Fluid
AFI FV:      Subjectively within normal limits

AFI Sum(cm)     %Tile       Largest Pocket(cm)
18.66           84

RUQ(cm)       RLQ(cm)       LUQ(cm)        LLQ(cm)
5.96
Biophysical Evaluation
Amniotic F.V:   Pocket => 2 cm two         F. Tone:         Observed
planes
F. Movement:    Observed                   Score:           [DATE]
F. Breathing:   Observed
Gestational Age

LMP:           40w 2d        Date:  03/06/15                 EDD:   12/11/15
Best:          40w 2d     Det. By:  LMP  (03/06/15)          EDD:   12/11/15
Impression

SIUP at 14w6d
active singleton fetus
BPP [DATE]
AFI is normal
Recommendations

Recommend management as clinically indicated, noting
delivery would be a reasonable recommendation given
gestational age.
Correlation with fetal heart tracing is recommended (remote
read of BPP only).

## 2017-09-18 IMAGING — US US PELVIS COMPLETE
1 series · 15 of 25 positions shown · non-contrast
Comparison: None.

CLINICAL DATA: Heavy vaginal bleeding.  Seven days postpartum.

EXAM:
TRANSABDOMINAL ULTRASOUND OF PELVIS
TECHNIQUE: Transabdominal ultrasound examination of the pelvis was performed
including evaluation of the uterus, ovaries, adnexal regions, and
pelvic cul-de-sac.

[Series 1: us pelvis complete · 29 acquisitions, 15 frames shown]
[im 1/29]
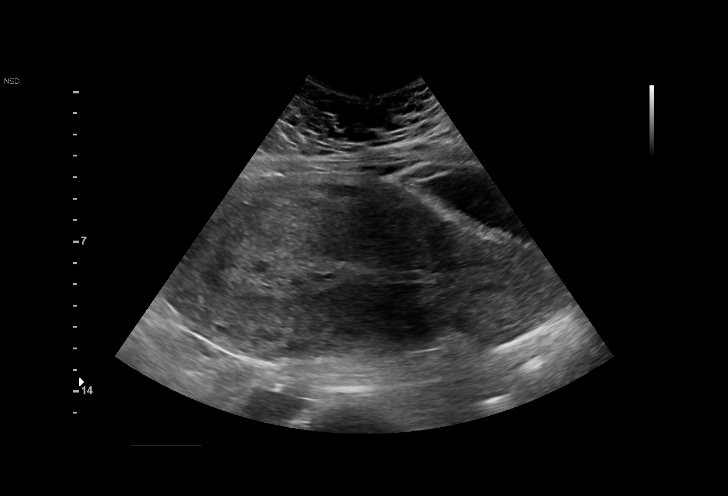
[im 3/29]
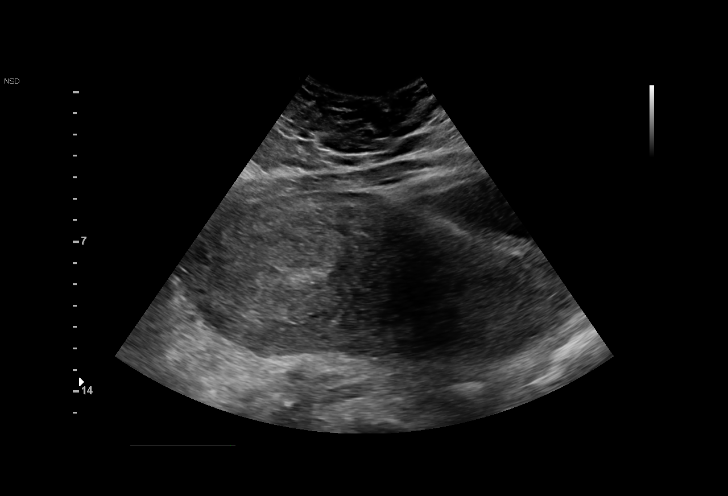
[im 5/29]
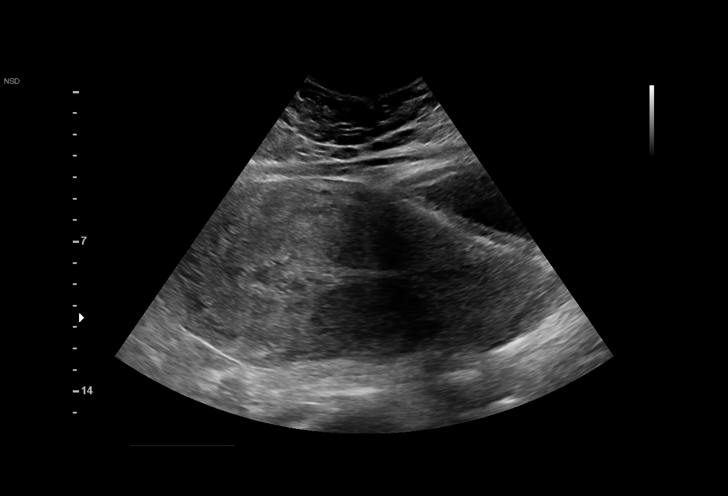
[im 6/29]
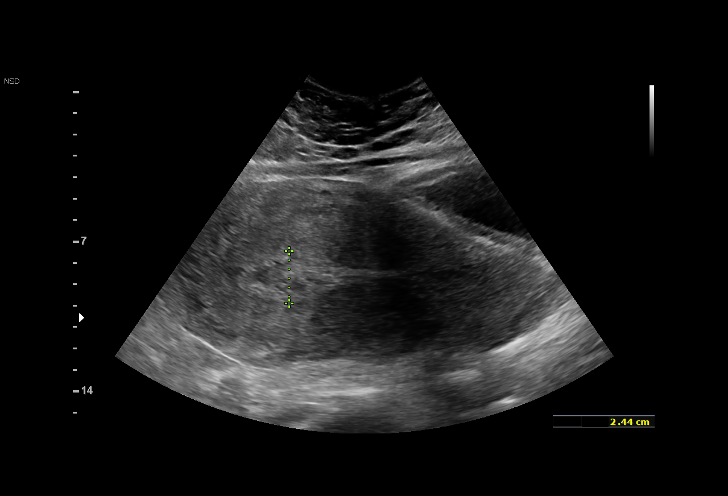
[im 9/29]
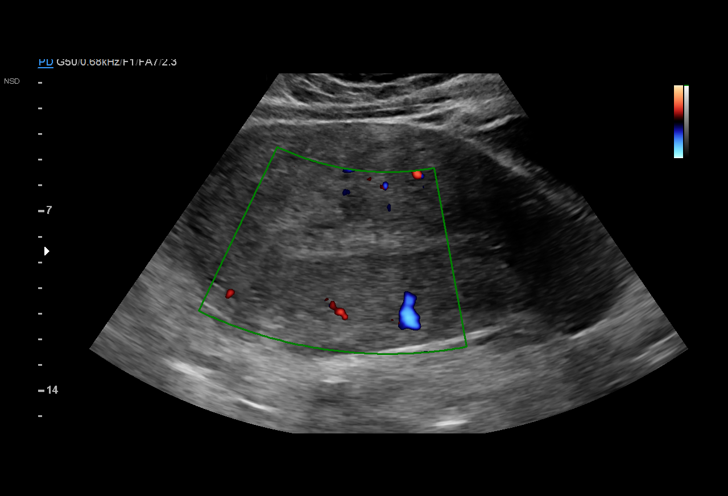
[im 11/29]
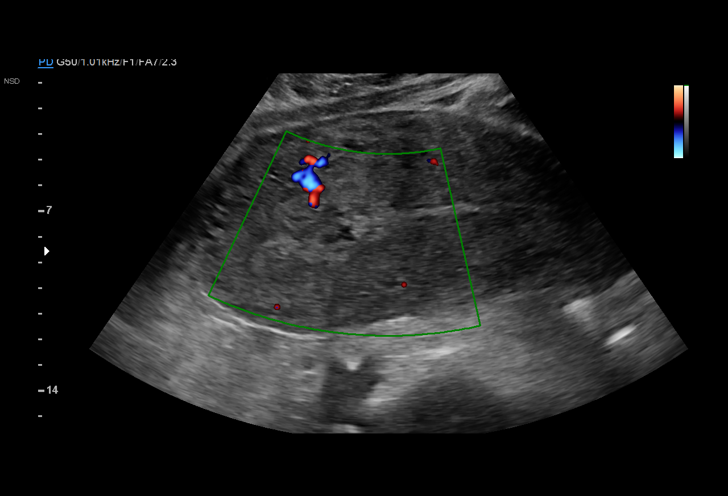
[im 12/29]
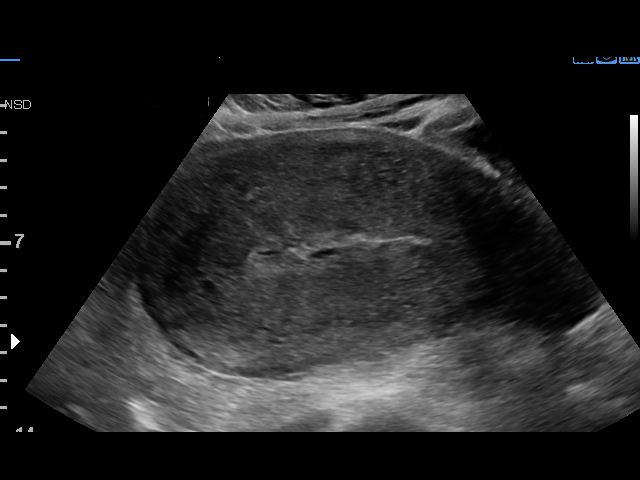
[im 15/29]
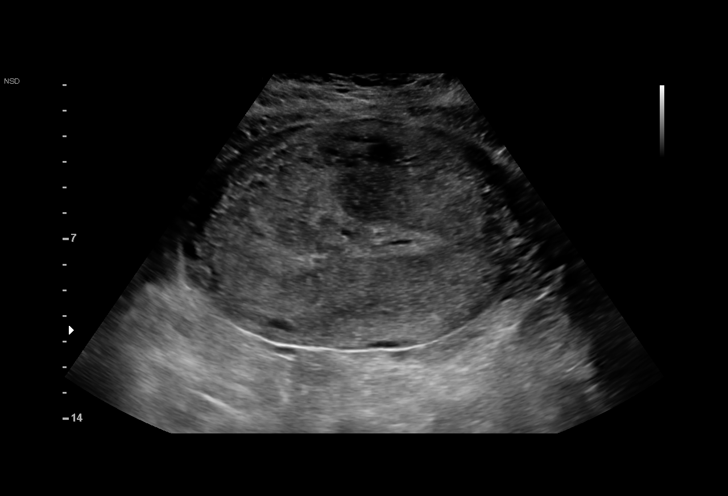
[im 17/29]
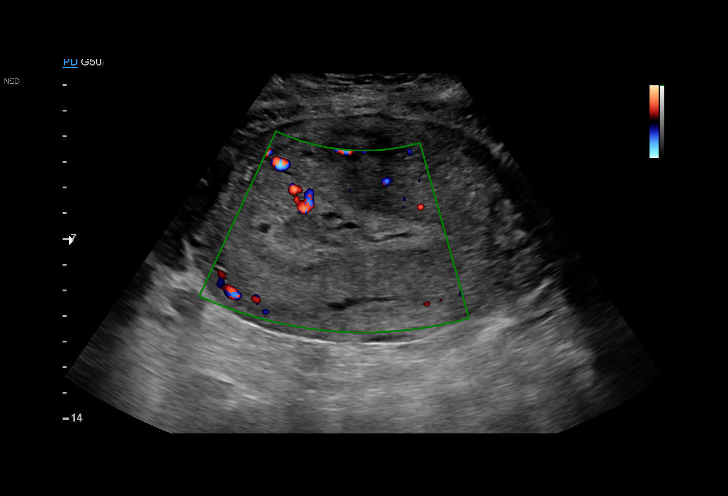
[im 18/29]
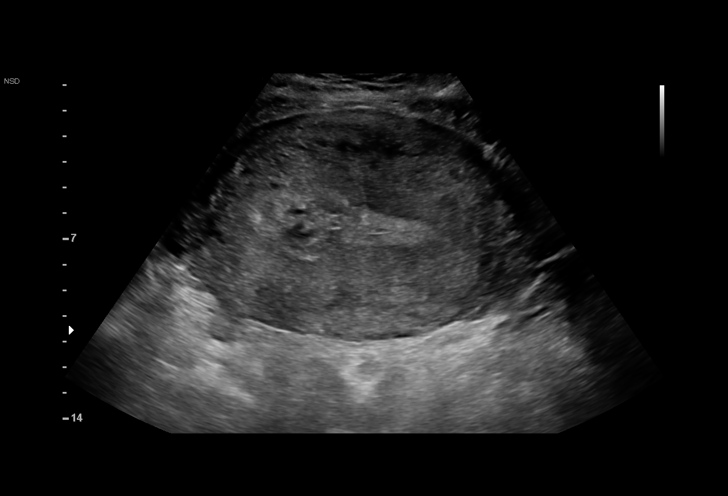
[im 20/29]
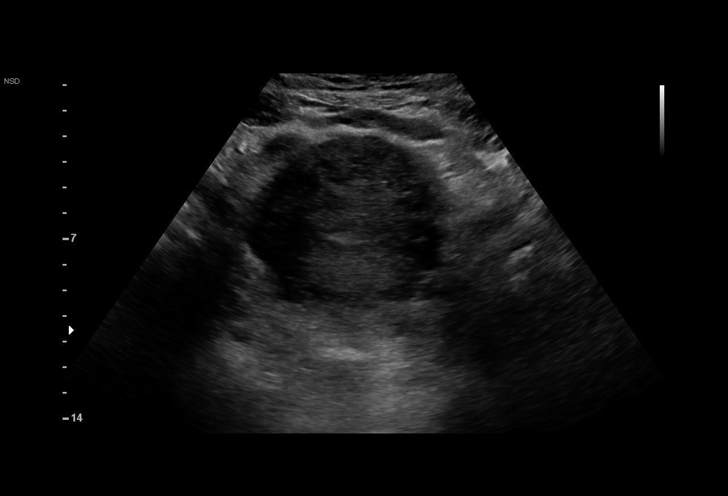
[im 23/29]
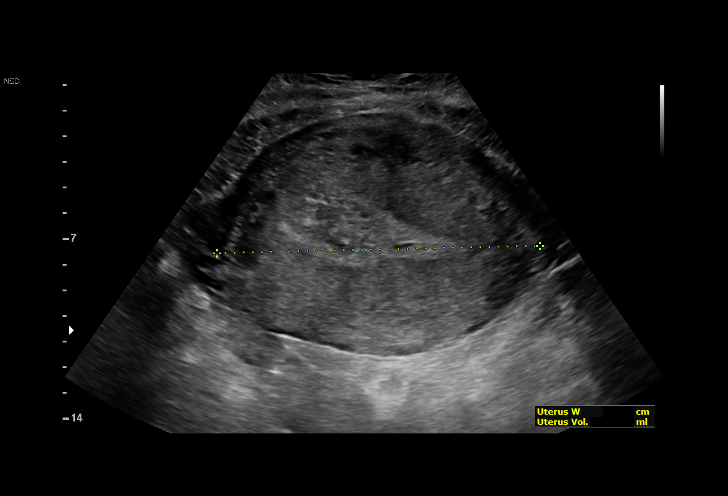
[im 24/29]
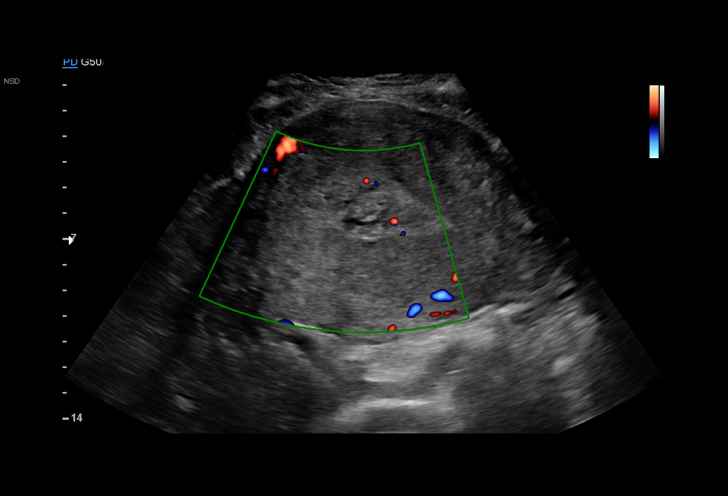
[im 26/29]
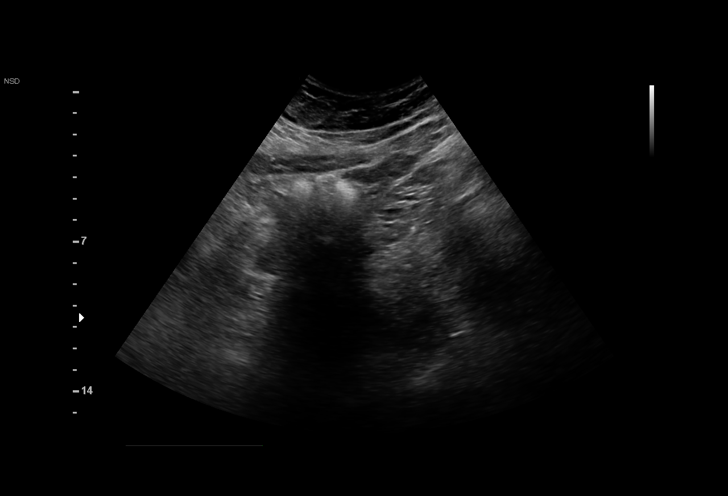
[im 29/29]
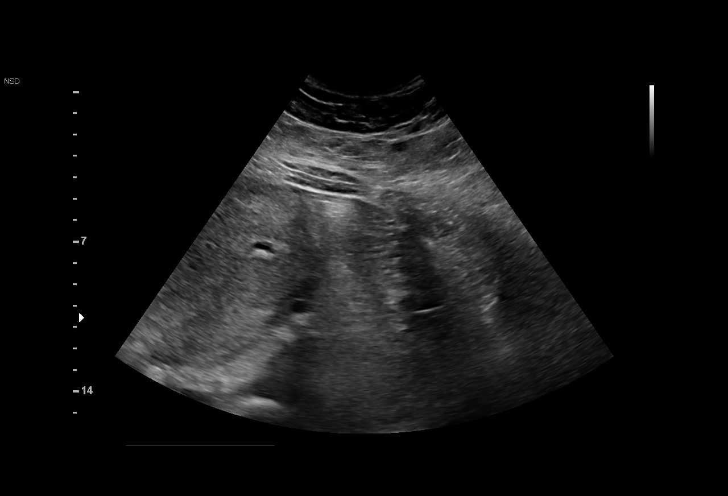

[15 of 25 positions shown; findings below may reference images not displayed]

FINDINGS: Uterus

Measurements: 18.2 x 9.3 x 12.6 cm. There is postpartum enlargement
of the uterus

Endometrium

Thickness: 2.4 cm. At the right anterior aspect of the upper
endometrial cavity, there is a heterogeneous echotexture area with
associated increased vascularity, measuring approximately 2.5 cm.

Right ovary

Not visualized.

Left ovary

Not visualized.

Other findings:  No abnormal free fluid.
IMPRESSION: Heterogeneous, vascular area within the right anterior aspect of the
endometrial cavity, favored to represent retained products of
conception.

## 2017-12-07 IMAGING — US US OB COMP LESS 14 WK
1 series · 14 of 28 positions shown · non-contrast
Comparison: None.

CLINICAL DATA: Pregnant patient with midline pelvic pain for 2
weeks. Quantitative HCG 929.5.

EXAM:
OBSTETRIC <14 WK US AND TRANSVAGINAL OB US
TECHNIQUE: Both transabdominal and transvaginal ultrasound examinations were
performed for complete evaluation of the gestation as well as the
maternal uterus, adnexal regions, and pelvic cul-de-sac.
Transvaginal technique was performed to assess early pregnancy.

[Series 1: us ob comp less 14 wk · 0.23mm/px · 14 of 75 slices shown]
[im 3/75]
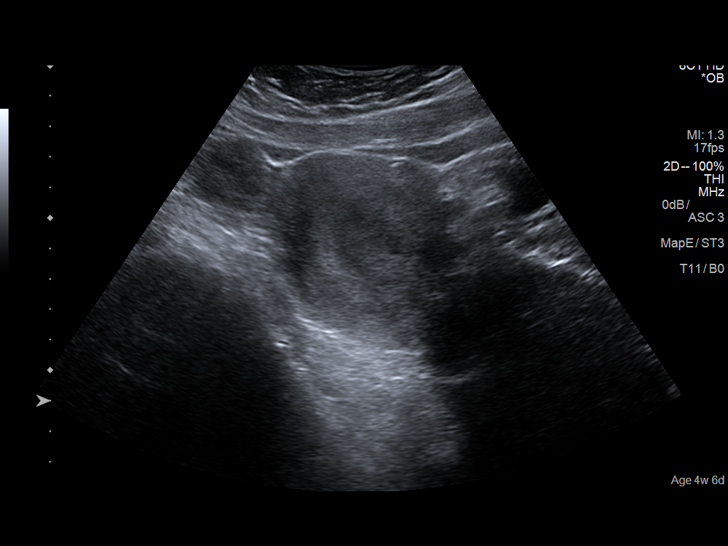
[im 9/75]
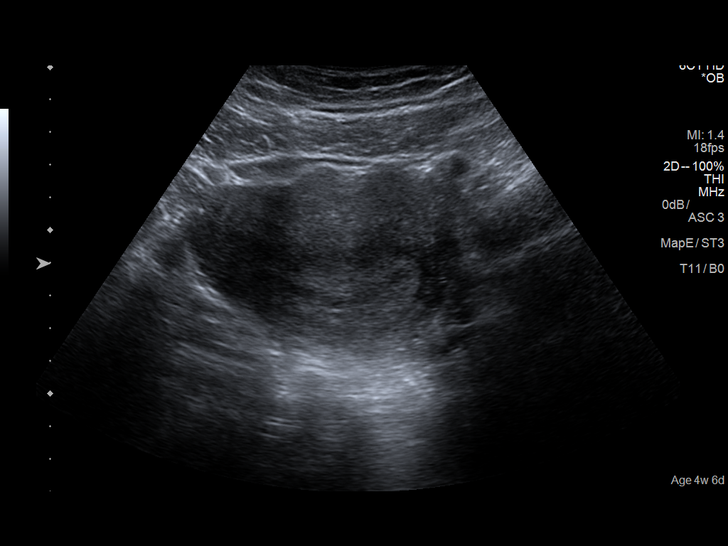
[im 14/75]
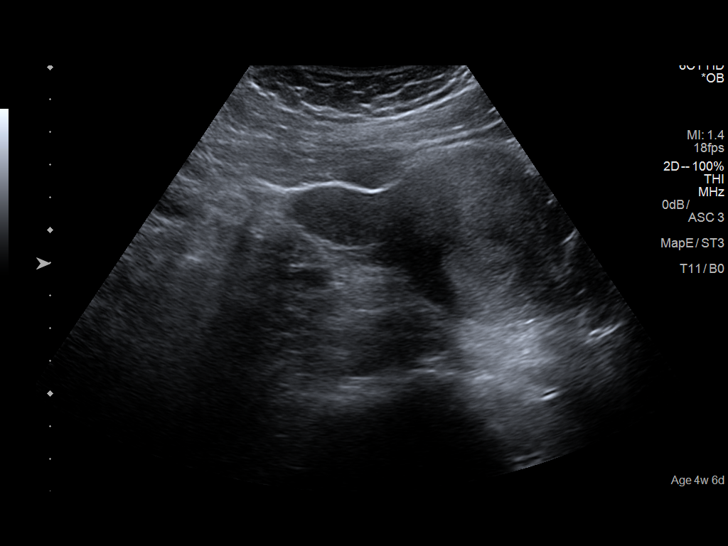
[im 20/75]
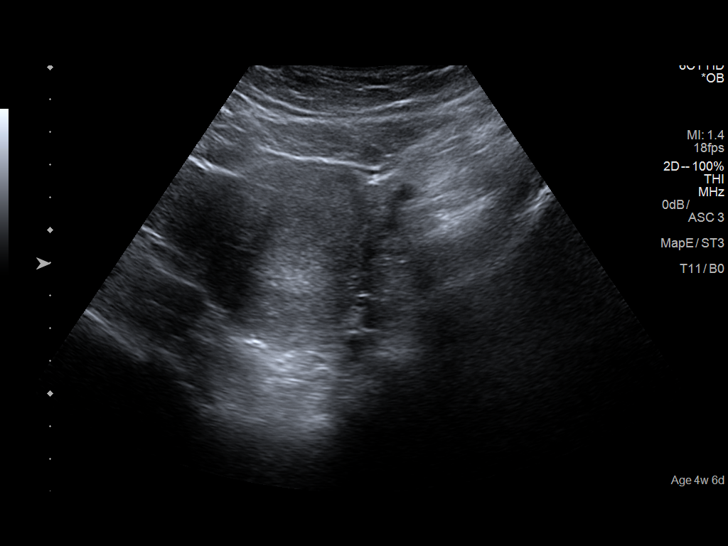
[im 25/75]
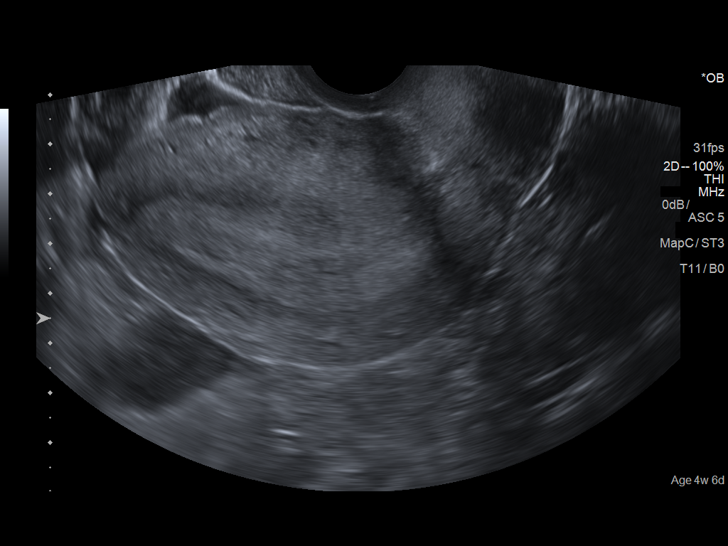
[im 31/75]
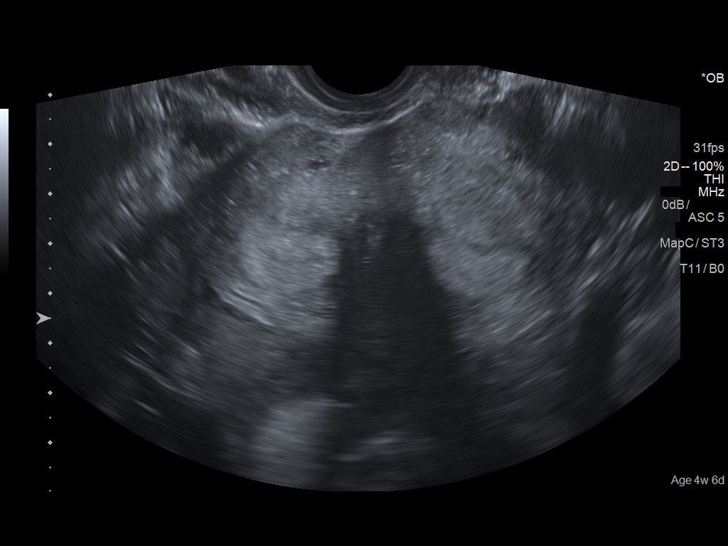
[im 36/75]
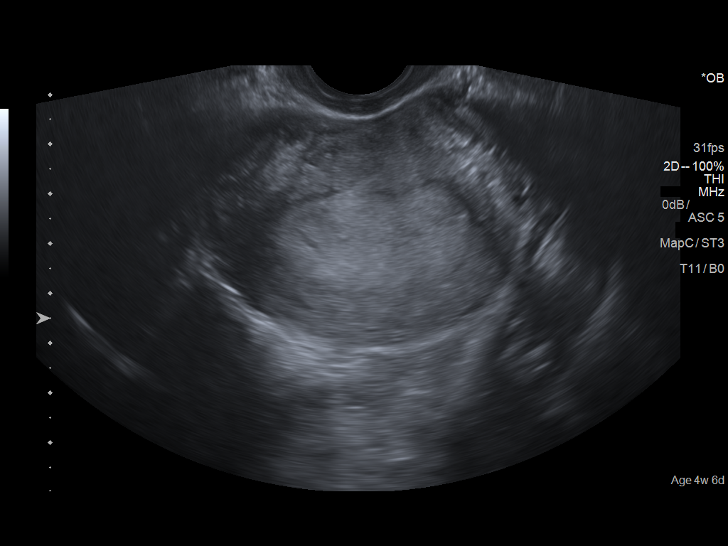
[im 42/75]
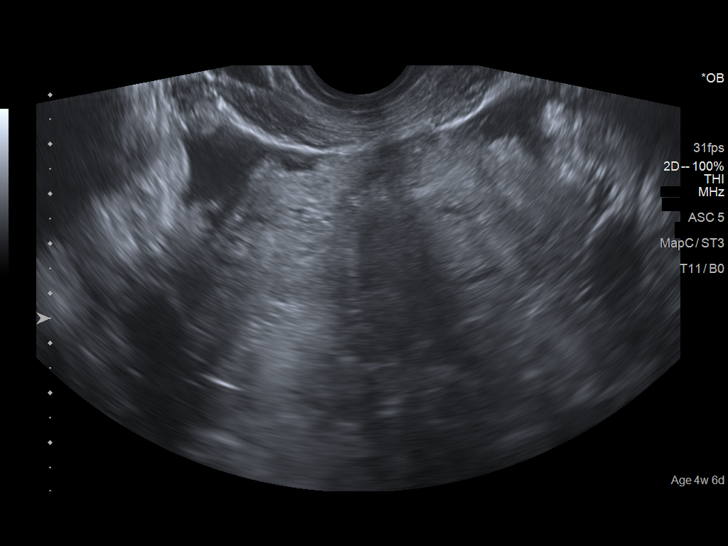
[im 47/75]
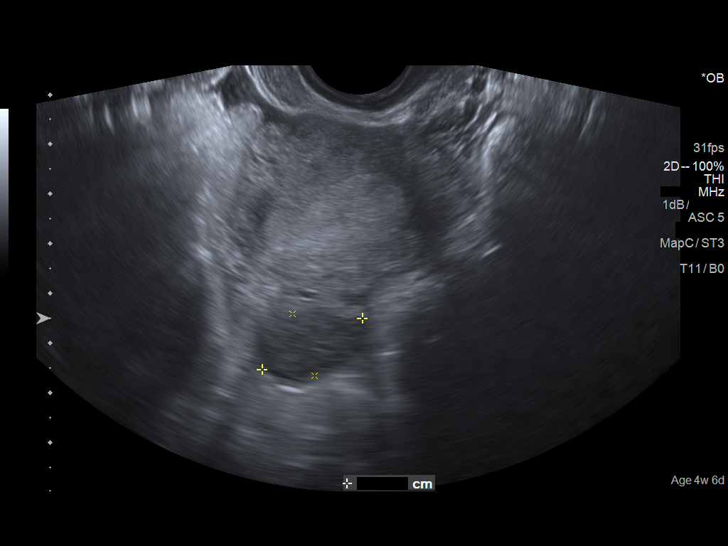
[im 53/75]
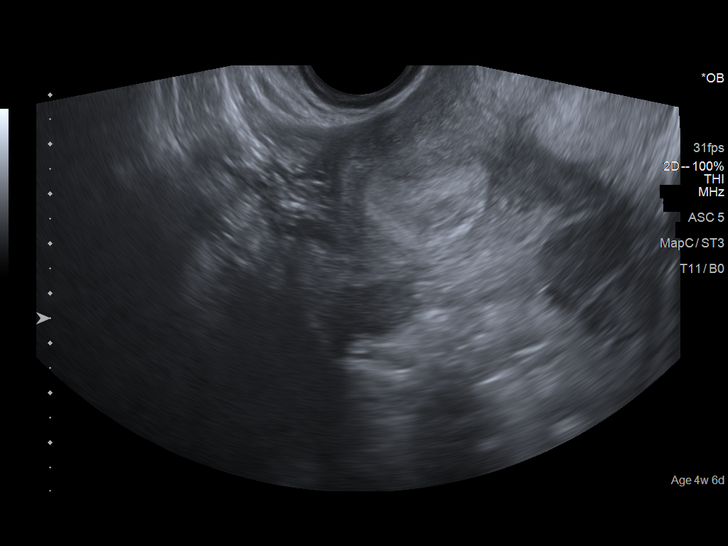
[im 58/75]
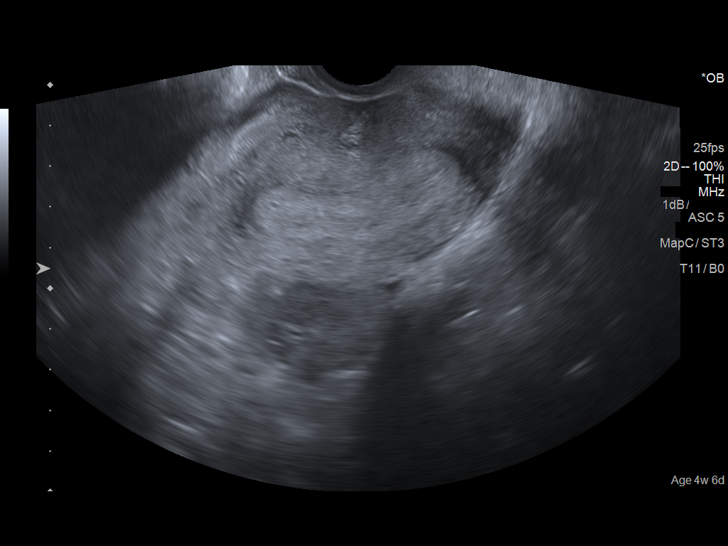
[im 64/75]
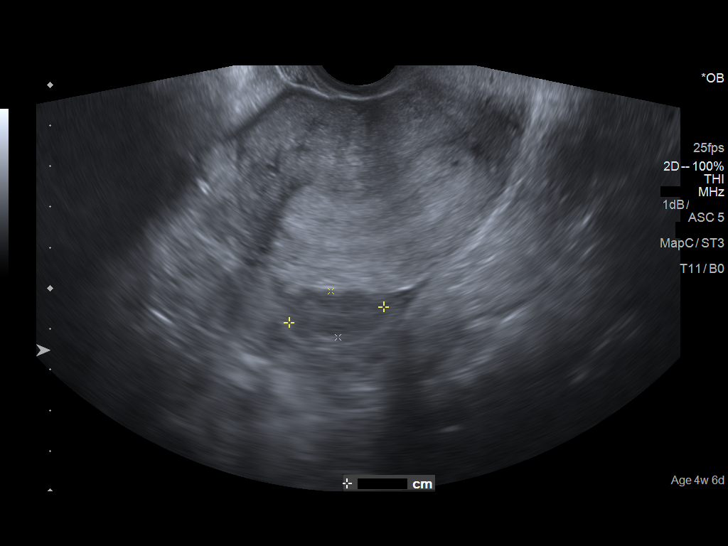
[im 69/75]
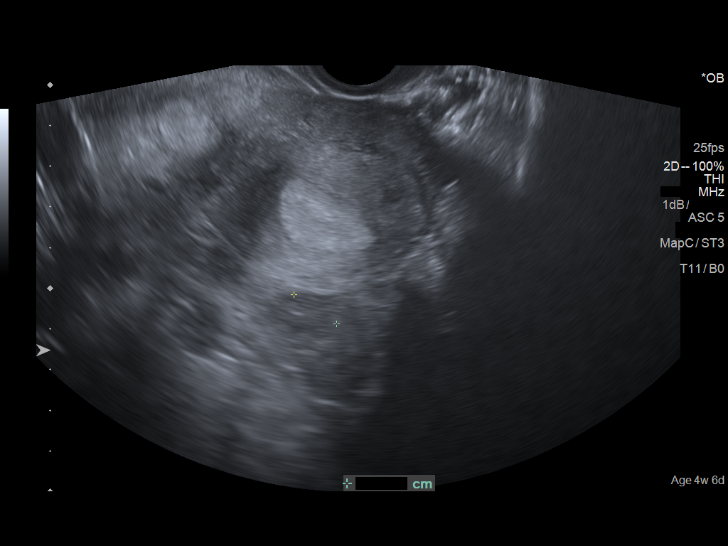
[im 75/75]
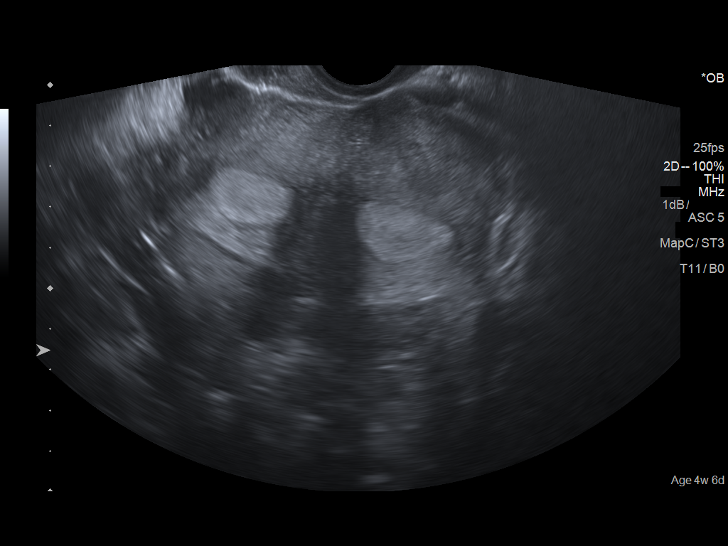

[14 of 28 positions shown; findings below may reference images not displayed]

FINDINGS: Intrauterine gestational sac: Not visualized. The endometrial stripe
measures up to 2.1 cm. Bicornuate uterus is noted.

Yolk sac:  Not visualized.

Embryo:  Not visualized.

Cardiac Activity: Not applicable.

MSD: Not applicable.

Maternal uterus/adnexae: Unremarkable.
IMPRESSION: No ectopic or intrauterine pregnancy is identified. The endometrial
stripe is thickened. Findings are nonspecific and could be due to
early pregnancy or impending abortion. Recommend follow-up
quantitative HCG. Repeat ultrasound in 10 days could also be used
for further evaluation.

Bicornuate uterus.

## 2018-02-24 ENCOUNTER — Emergency Department (HOSPITAL_COMMUNITY): Payer: Medicaid Other

## 2018-02-24 ENCOUNTER — Emergency Department (HOSPITAL_COMMUNITY)
Admission: EM | Admit: 2018-02-24 | Discharge: 2018-02-25 | Disposition: A | Discharge status: Home / Self Care | Payer: Medicaid Other | Attending: Emergency Medicine | Admitting: Emergency Medicine

## 2018-02-24 DIAGNOSIS — S82192A Other fracture of upper end of left tibia, initial encounter for closed fracture: Secondary | ICD-10-CM | POA: Insufficient documentation

## 2018-02-24 DIAGNOSIS — W108XXA Fall (on) (from) other stairs and steps, initial encounter: Secondary | ICD-10-CM

## 2018-02-24 DIAGNOSIS — Y998 Other external cause status: Secondary | ICD-10-CM | POA: Insufficient documentation

## 2018-02-24 DIAGNOSIS — Z87891 Personal history of nicotine dependence: Secondary | ICD-10-CM | POA: Insufficient documentation

## 2018-02-24 DIAGNOSIS — Y9301 Activity, walking, marching and hiking: Secondary | ICD-10-CM | POA: Insufficient documentation

## 2018-02-24 DIAGNOSIS — S76112A Strain of left quadriceps muscle, fascia and tendon, initial encounter: Secondary | ICD-10-CM | POA: Insufficient documentation

## 2018-02-24 DIAGNOSIS — Y929 Unspecified place or not applicable: Secondary | ICD-10-CM | POA: Insufficient documentation

## 2018-02-24 DIAGNOSIS — S86812A Strain of other muscle(s) and tendon(s) at lower leg level, left leg, initial encounter: Secondary | ICD-10-CM

## 2018-02-24 DIAGNOSIS — S82153A Displaced fracture of unspecified tibial tuberosity, initial encounter for closed fracture: Secondary | ICD-10-CM

## 2018-02-24 MED ORDER — OXYCODONE-ACETAMINOPHEN 5-325 MG PO TABS
1.0000 | ORAL_TABLET | ORAL | Status: DC | PRN
  Administered 2018-02-25: 1 via ORAL
  Filled 2018-02-24 (×2): qty 1

## 2018-02-24 NOTE — ED Triage Notes (Signed)
Pt reports L knee pain after falling down few steps approx 1hr PTA. Mild bruising noted.

## 2018-02-24 NOTE — ED Notes (Signed)
To x-ray

## 2018-02-25 MED ORDER — OXYCODONE-ACETAMINOPHEN 5-325 MG PO TABS: 1.0000 | ORAL_TABLET | ORAL | 0 refills | Status: DC | PRN

## 2018-02-25 NOTE — ED Notes (Signed)
Ortho tech called for crutches and knee immobilizer.  

## 2018-02-25 NOTE — ED Provider Notes (Signed)
[facility] EMERGENCY DEPARTMENT Provider Note   CSN: [id number] Arrival date & time: 02/24/18  2013     History   Chief Complaint Chief Complaint  Patient presents with  . Knee Pain    HPI Tonya Walls is a 27 y.o. female with a history of obesity who presents to the emergency department with a chief complaint of fall occurred approximately 1 hour prior to arrival.  Patient states that she lost her footing while walking down a flight of steps and fell down approximately 3 steps.  States she fell onto her left knee.  She reports sudden onset, severe, sharp left knee pain.  She has been unable to pay any weight on the left leg since the injury.  Pain is worse with weightbearing and improved with nonweightbearing.  No history of previous injury or surgery.  She states the pain radiates up her thigh and down into her lower leg.  She denies numbness, weakness, left ankle or hip pain.  No treatment prior to arrival.  The history is provided by the patient. No language interpreter was used.    Past Medical History:  Diagnosis Date  . Bicornuate uterus   . Obesity, Class III, BMI 40-49.9 (morbid obesity) (HCC)     Patient Active Problem List   Diagnosis Date Noted  . Pregnancy 12/15/2015  . Obesity in pregnancy with BMI of 50 , antepartum 11/27/2015  . Group B Streptococcus carrier, +RV culture, currently pregnant 11/25/2015  . Supervision of high-risk pregnancy 05/28/2015  . Bicornuate uterus affecting pregnancy, antepartum 04/15/2015    Past Surgical History:  Procedure Laterality Date  . NO PAST SURGERIES       OB History    Gravida  1   Para  1   Term  1   Preterm  0   AB  0   Living  1     SAB  0   TAB  0   Ectopic  0   Multiple  0   Live Births  1            Home Medications    Prior to Admission medications   Medication Sig Start Date End Date Taking? Authorizing Provider  acetaminophen (TYLENOL) 500 MG tablet Take  1,000 mg by mouth every 6 (six) hours as needed for mild pain, moderate pain or headache.    [provider]  calcium carbonate (TUMS - DOSED IN MG ELEMENTAL CALCIUM) 500 MG chewable tablet Chew 1 tablet by mouth as needed for indigestion or heartburn.    [provider]  cetirizine-pseudoephedrine (ZYRTEC-D) 5-120 MG tablet Take 1 tablet by mouth daily. 08/31/17   Wurst, [place], PA-C  ibuprofen (ADVIL,MOTRIN) 800 MG tablet Take 1 tablet (800 mg total) by mouth every 8 (eight) hours as needed. 12/22/15   Thressa Sheller D, CNM  lidocaine (XYLOCAINE) 2 % solution Use as directed 15 mLs in the mouth or throat as needed for mouth pain. 08/31/17   Wurst, [place], PA-C  misoprostol (CYTOTEC) 200 MCG tablet Take 4 tablets (800 mcg total) by mouth every 12 (twelve) hours. 12/22/15   Thressa Sheller D, CNM  oxyCODONE-acetaminophen (PERCOCET/ROXICET) 5-325 MG tablet Take 1 tablet by mouth every 4 (four) hours as needed for severe pain. 02/25/18   Jelani Trueba A, PA-C  Prenatal Vit-Fe Fumarate-FA (PRENATAL MULTIVITAMIN) TABS tablet Take 1 tablet by mouth daily.    [provider]  senna-docusate (SENOKOT-S) 8.6-50 MG tablet Take 2 tablets by mouth  daily. 12/18/15   Tillman Sersiccio, Angela C, DO    Family History Family History  Problem Relation Age of Onset  . Diabetes Father     Social History Social History   Tobacco Use  . Smoking status: Former Smoker    Types: Cigarettes    Last attempt to quit: 11/13/2012    Years since quitting: 5.2  . Smokeless tobacco: Never Used  Substance Use Topics  . Alcohol use: No  . Drug use: No     Allergies   Patient has no known allergies.   Review of Systems Review of Systems  Constitutional: Negative for activity change, chills and fever.  Respiratory: Negative for shortness of breath.   Cardiovascular: Negative for chest pain.  Gastrointestinal: Negative for abdominal pain.  Musculoskeletal: Positive for arthralgias, gait problem and  myalgias. Negative for back pain.  Skin: Negative for rash.  Neurological: Negative for weakness and numbness.     Physical Exam Updated Vital Signs BP (!) 135/110   Pulse 84   Temp 98.6 F (37 C) (Oral)   Resp (!) 24   SpO2 98%   Physical Exam Vitals signs and nursing note reviewed.  Constitutional:      General: She is not in acute distress. HENT:     Head: Normocephalic.  Eyes:     Conjunctiva/sclera: Conjunctivae normal.  Neck:     Musculoskeletal: Neck supple.  Cardiovascular:     Rate and Rhythm: Normal rate and regular rhythm.     Heart sounds: No murmur. No friction rub. No gallop.   Pulmonary:     Effort: Pulmonary effort is normal. No respiratory distress.  Abdominal:     General: There is no distension.     Palpations: Abdomen is soft.  Musculoskeletal:     Comments: Exam is somewhat limited secondary to patient's body habitus.  Patellar tendon does not appear intact with palpation.  Exquisitely tender to palpation diffusely throughout the knee.  Range of motion of the left knee is limited secondary to pain.  No tenderness to palpation to the left ankle or hip.  DP and PT pulses are 2+ and symmetric.  Sensation is intact and equal throughout.  Skin:    General: Skin is warm.     Findings: No rash.  Neurological:     Mental Status: She is alert.  Psychiatric:        Behavior: Behavior normal.      ED Treatments / Results  Labs (all labs ordered are listed, but only abnormal results are displayed) Labs Reviewed - No data to display  EKG None  Radiology Dg Knee Complete 4 Views Left  Result Date: 02/25/2018 CLINICAL DATA:  Larey SeatFell 1 hour ago.  Pain. EXAM: LEFT KNEE - COMPLETE 4+ VIEW COMPARISON:  None. FINDINGS: Acute tibial tuberosity avulsion fracture, fragment 3.5 cm superiorly distracted. High-riding patella. No dislocation. No destructive bony lesions. Infra patella soft tissue swelling without subcutaneous gas or radiopaque foreign bodies.  IMPRESSION: Acute tibial tuberosity avulsion fracture, associated high-riding patella. Electronically Signed   By: Awilda Metroourtnay  Bloomer M.D.   On: 02/25/2018 00:12    Procedures Procedures (including critical care time)  Medications Ordered in ED Medications - No data to display   Initial Impression / Assessment and Plan / ED Course  I have reviewed the triage vital signs and the nursing notes.  Pertinent labs & imaging results that were available during my care of the patient were reviewed by me and considered in my medical decision  making (see chart for details).     27 year old female presenting with left knee pain after a fall onto her left knee.  She has been unable to bear weight on the left leg since the injury.  Percocet given by nursing staff in the ED.  On exam, she is exquisitely tender to palpation diffusely throughout the knee.  X-ray of the left knee with acute tibial tuberosity avulsion fracture with an associated high riding patella.  No dislocation.  Suspect patellar tendon rupture.  Consulted orthopedics and spoke with Dr. Dion Saucier who recommended the patient be placed in a knee immobilizer and follow-up in the clinic in 2 days when they reopen.  Crutches and knee immobilizer given.  Will discharge home with pain medication and follow-up to orthopedics. A 35-month prescription history query was performed using the [place] CSRS prior to discharge.  Strict return precautions given.  She is hemodynamically stable and in no acute distress.  She is safe for discharge to home with outpatient follow-up at this time.   Final Clinical Impressions(s) / ED Diagnoses   Final diagnoses:  Fall (on) (from) other stairs and steps, initial encounter  Patellar tendon rupture, left, initial encounter  Avulsion fracture of tibial tuberosity    ED Discharge Orders         Ordered    oxyCODONE-acetaminophen (PERCOCET/ROXICET) 5-325 MG tablet  Every 4 hours PRN     02/25/18 0041             Frederik Pear A, PA-C 02/25/18 0960    Gilda Crease, MD 02/26/18 0008

## 2018-02-25 NOTE — ED Notes (Signed)
Pt became sick  Nausea  Whenever she stood up on the crutches   Feeling nauseated  Head lowered  Vitals checked

## 2018-02-25 NOTE — Discharge Instructions (Signed)
Thank you for allowing me to care for you today in the Emergency Department.   Call Dr. Shelba FlakeLandau's office first thing on Monday morning.  He wants to see you in the clinic on Monday.  For pain control, take 600 mg of ibuprofen every 6 hours with food.  You can also take 1 tablet of Percocet every 4 hours as needed for severe pain.  It is safe to take these 2 medications together.  Do not drive while taking Percocet because it is a narcotic and can cause you to be impaired.  Do not take other medications that make you sleepy or drink alcohol.  It is a narcotic and can also be addicting.  You do not have to take 1 every 4 hours, but this is the most frequently that you can take this medication.  Only take it when your pain is severe and uncontrollable.  Wear the knee immobilizer constantly until you are seen by Dr. Dion SaucierLandau.  You can use the crutches to help you get around.  Try to elevate your leg so that your toes are at or above the level of your nose when you are sitting and resting to help with pain and swelling.  Return to the emergency department if you develop significant pain, redness, or swelling in your upper or lower leg, if your toes turn blue, if you lose sensation and your lower leg, or if you have any fall or injury, or other new, concerning symptoms.

## 2018-02-28 ENCOUNTER — Other Ambulatory Visit: Payer: Self-pay

## 2018-02-28 ENCOUNTER — Encounter (HOSPITAL_COMMUNITY): Payer: Self-pay | Admitting: *Deleted

## 2018-02-28 NOTE — Progress Notes (Signed)
Pt denies SOB, chest pain, and being under the care of a cardiologist. Pt denies having a stress test, echo and cardiac cath. Pt denies having an EKG and chest x ray within the last year. Pt denies recent labs. Pt made aware to stop taking vitamins, fish oil and herbal medications. Do not take any NSAIDs ie: Ibuprofen, Advil, Naproxen (Aleve), Motrin, BC and Goody Powder. Pt verbalized understanding of all pre-op instructions. 

## 2018-03-01 ENCOUNTER — Ambulatory Visit (HOSPITAL_COMMUNITY): Payer: Self-pay | Admitting: Anesthesiology

## 2018-03-01 ENCOUNTER — Other Ambulatory Visit: Payer: Self-pay

## 2018-03-01 ENCOUNTER — Encounter (HOSPITAL_COMMUNITY): Payer: Self-pay

## 2018-03-01 ENCOUNTER — Ambulatory Visit (HOSPITAL_COMMUNITY)
Admission: RE | Admit: 2018-03-01 | Discharge: 2018-03-01 | Disposition: A | Discharge status: Home / Self Care | Payer: Self-pay | Attending: Orthopaedic Surgery | Admitting: Orthopaedic Surgery

## 2018-03-01 ENCOUNTER — Ambulatory Visit (HOSPITAL_COMMUNITY): Payer: Self-pay

## 2018-03-01 ENCOUNTER — Encounter (HOSPITAL_COMMUNITY)
Admission: RE | Disposition: A | Discharge status: Home / Self Care | Payer: Self-pay | Source: Home / Self Care | Attending: Orthopaedic Surgery

## 2018-03-01 DIAGNOSIS — Z6841 Body Mass Index (BMI) 40.0 and over, adult: Secondary | ICD-10-CM | POA: Insufficient documentation

## 2018-03-01 DIAGNOSIS — Z87891 Personal history of nicotine dependence: Secondary | ICD-10-CM | POA: Insufficient documentation

## 2018-03-01 DIAGNOSIS — S82152A Displaced fracture of left tibial tuberosity, initial encounter for closed fracture: Secondary | ICD-10-CM | POA: Insufficient documentation

## 2018-03-01 DIAGNOSIS — Z09 Encounter for follow-up examination after completed treatment for conditions other than malignant neoplasm: Secondary | ICD-10-CM

## 2018-03-01 DIAGNOSIS — Z791 Long term (current) use of non-steroidal anti-inflammatories (NSAID): Secondary | ICD-10-CM | POA: Insufficient documentation

## 2018-03-01 DIAGNOSIS — W109XXA Fall (on) (from) unspecified stairs and steps, initial encounter: Secondary | ICD-10-CM | POA: Insufficient documentation

## 2018-03-01 DIAGNOSIS — S76112A Strain of left quadriceps muscle, fascia and tendon, initial encounter: Secondary | ICD-10-CM | POA: Insufficient documentation

## 2018-03-01 HISTORY — DX: Presence of spectacles and contact lenses: Z97.3

## 2018-03-01 HISTORY — PX: OPEN REDUCTION INTERNAL FIXATION (ORIF) TIBIAL TUBERCLE: SHX6482

## 2018-03-01 HISTORY — DX: Displaced fracture of unspecified tibial tuberosity, initial encounter for closed fracture: S82.153A

## 2018-03-01 LAB — HEMOGLOBIN: Hemoglobin: 9.1 g/dL — ABNORMAL LOW (ref 12.0–15.0)

## 2018-03-01 LAB — POCT PREGNANCY, URINE: PREG TEST UR: NEGATIVE

## 2018-03-01 SURGERY — OPEN REDUCTION INTERNAL FIXATION (ORIF) TIBIAL TUBERCLE
Anesthesia: Regional | Laterality: Left

## 2018-03-01 MED ORDER — ASPIRIN EC 325 MG PO TBEC: 325.0000 mg | DELAYED_RELEASE_TABLET | Freq: Two times a day (BID) | ORAL | 0 refills | Status: AC

## 2018-03-01 MED ORDER — OXYCODONE HCL 5 MG PO TABS: 5.0000 mg | ORAL_TABLET | Freq: Once | ORAL | Status: DC | PRN

## 2018-03-01 MED ORDER — ONDANSETRON HCL 4 MG/2ML IJ SOLN
INTRAMUSCULAR | Status: DC | PRN
  Administered 2018-03-01: 4 mg via INTRAVENOUS

## 2018-03-01 MED ORDER — FENTANYL CITRATE (PF) 100 MCG/2ML IJ SOLN
50.0000 ug | Freq: Once | INTRAMUSCULAR | Status: AC
  Administered 2018-03-01: 50 ug via INTRAVENOUS

## 2018-03-01 MED ORDER — FENTANYL CITRATE (PF) 250 MCG/5ML IJ SOLN
INTRAMUSCULAR | Status: AC
  Filled 2018-03-01: qty 5

## 2018-03-01 MED ORDER — CEFAZOLIN SODIUM-DEXTROSE 2-4 GM/100ML-% IV SOLN
2.0000 g | INTRAVENOUS | Status: AC
  Administered 2018-03-01: 2 g via INTRAVENOUS

## 2018-03-01 MED ORDER — PHENYLEPHRINE 40 MCG/ML (10ML) SYRINGE FOR IV PUSH (FOR BLOOD PRESSURE SUPPORT)
PREFILLED_SYRINGE | INTRAVENOUS | Status: AC
  Filled 2018-03-01: qty 10

## 2018-03-01 MED ORDER — VANCOMYCIN HCL 1000 MG IV SOLR
INTRAVENOUS | Status: DC | PRN
  Administered 2018-03-01: 1000 mg

## 2018-03-01 MED ORDER — CHLORHEXIDINE GLUCONATE 4 % EX LIQD: 60.0000 mL | Freq: Once | CUTANEOUS | Status: DC

## 2018-03-01 MED ORDER — ACETAMINOPHEN 160 MG/5ML PO SOLN: 1000.0000 mg | Freq: Once | ORAL | Status: DC | PRN

## 2018-03-01 MED ORDER — DEXAMETHASONE SODIUM PHOSPHATE 10 MG/ML IJ SOLN
INTRAMUSCULAR | Status: AC
  Filled 2018-03-01: qty 1

## 2018-03-01 MED ORDER — PROPOFOL 10 MG/ML IV BOLUS
INTRAVENOUS | Status: AC
  Filled 2018-03-01: qty 20

## 2018-03-01 MED ORDER — PROPOFOL 10 MG/ML IV BOLUS
INTRAVENOUS | Status: DC | PRN
  Administered 2018-03-01: 200 mg via INTRAVENOUS

## 2018-03-01 MED ORDER — FENTANYL CITRATE (PF) 100 MCG/2ML IJ SOLN
INTRAMUSCULAR | Status: AC
  Filled 2018-03-01: qty 2

## 2018-03-01 MED ORDER — OXYCODONE HCL 5 MG/5ML PO SOLN: 5.0000 mg | Freq: Once | ORAL | Status: DC | PRN

## 2018-03-01 MED ORDER — LIDOCAINE 2% (20 MG/ML) 5 ML SYRINGE
INTRAMUSCULAR | Status: DC | PRN
  Administered 2018-03-01: 40 mg via INTRAVENOUS

## 2018-03-01 MED ORDER — OXYCODONE HCL 5 MG PO TABS
10.0000 mg | ORAL_TABLET | Freq: Once | ORAL | Status: AC
  Administered 2018-03-01: 10 mg via ORAL

## 2018-03-01 MED ORDER — LACTATED RINGERS IV SOLN
INTRAVENOUS | Status: DC
  Administered 2018-03-01 (×2): via INTRAVENOUS

## 2018-03-01 MED ORDER — OXYCODONE HCL 5 MG PO TABS
ORAL_TABLET | ORAL | Status: AC
  Filled 2018-03-01: qty 1

## 2018-03-01 MED ORDER — OXYCODONE HCL 5 MG PO TABS: ORAL_TABLET | ORAL | 0 refills | Status: AC

## 2018-03-01 MED ORDER — ACETAMINOPHEN 500 MG PO TABS: 1000.0000 mg | ORAL_TABLET | Freq: Once | ORAL | Status: DC | PRN

## 2018-03-01 MED ORDER — ACETAMINOPHEN 10 MG/ML IV SOLN
INTRAVENOUS | Status: AC
  Filled 2018-03-01: qty 100

## 2018-03-01 MED ORDER — ROPIVACAINE HCL 7.5 MG/ML IJ SOLN
INTRAMUSCULAR | Status: DC | PRN
  Administered 2018-03-01: 20 mL via PERINEURAL

## 2018-03-01 MED ORDER — 0.9 % SODIUM CHLORIDE (POUR BTL) OPTIME
TOPICAL | Status: DC | PRN
  Administered 2018-03-01: 1000 mL

## 2018-03-01 MED ORDER — VANCOMYCIN HCL 1000 MG IV SOLR
INTRAVENOUS | Status: AC
  Filled 2018-03-01: qty 1000

## 2018-03-01 MED ORDER — PHENYLEPHRINE 40 MCG/ML (10ML) SYRINGE FOR IV PUSH (FOR BLOOD PRESSURE SUPPORT)
PREFILLED_SYRINGE | INTRAVENOUS | Status: DC | PRN
  Administered 2018-03-01 (×2): 40 ug via INTRAVENOUS
  Administered 2018-03-01: 80 ug via INTRAVENOUS

## 2018-03-01 MED ORDER — DEXAMETHASONE SODIUM PHOSPHATE 10 MG/ML IJ SOLN
INTRAMUSCULAR | Status: DC | PRN
  Administered 2018-03-01: 5 mg via INTRAVENOUS

## 2018-03-01 MED ORDER — ACETAMINOPHEN 500 MG PO TABS: 1000.0000 mg | ORAL_TABLET | Freq: Three times a day (TID) | ORAL | 0 refills | Status: AC

## 2018-03-01 MED ORDER — ONDANSETRON HCL 4 MG PO TABS: 4.0000 mg | ORAL_TABLET | Freq: Three times a day (TID) | ORAL | 1 refills | Status: AC | PRN

## 2018-03-01 MED ORDER — MIDAZOLAM HCL 2 MG/2ML IJ SOLN
INTRAMUSCULAR | Status: AC
  Administered 2018-03-01: 1 mg via INTRAVENOUS
  Filled 2018-03-01: qty 2

## 2018-03-01 MED ORDER — ACETAMINOPHEN 10 MG/ML IV SOLN
1000.0000 mg | Freq: Once | INTRAVENOUS | Status: DC | PRN
  Administered 2018-03-01: 1000 mg via INTRAVENOUS

## 2018-03-01 MED ORDER — BUPIVACAINE HCL (PF) 0.25 % IJ SOLN
INTRAMUSCULAR | Status: AC
  Filled 2018-03-01: qty 30

## 2018-03-01 MED ORDER — FENTANYL CITRATE (PF) 100 MCG/2ML IJ SOLN
25.0000 ug | INTRAMUSCULAR | Status: DC | PRN
  Administered 2018-03-01 (×2): 50 ug via INTRAVENOUS

## 2018-03-01 MED ORDER — LIDOCAINE 2% (20 MG/ML) 5 ML SYRINGE
INTRAMUSCULAR | Status: AC
  Filled 2018-03-01: qty 5

## 2018-03-01 MED ORDER — FENTANYL CITRATE (PF) 100 MCG/2ML IJ SOLN
INTRAMUSCULAR | Status: AC
  Administered 2018-03-01: 50 ug via INTRAVENOUS
  Filled 2018-03-01: qty 2

## 2018-03-01 MED ORDER — MIDAZOLAM HCL 2 MG/2ML IJ SOLN
1.0000 mg | Freq: Once | INTRAMUSCULAR | Status: AC
  Administered 2018-03-01: 1 mg via INTRAVENOUS

## 2018-03-01 MED ORDER — FENTANYL CITRATE (PF) 100 MCG/2ML IJ SOLN
INTRAMUSCULAR | Status: DC | PRN
  Administered 2018-03-01: 25 ug via INTRAVENOUS
  Administered 2018-03-01 (×2): 50 ug via INTRAVENOUS
  Administered 2018-03-01: 25 ug via INTRAVENOUS
  Administered 2018-03-01 (×2): 50 ug via INTRAVENOUS

## 2018-03-01 MED ORDER — MELOXICAM 7.5 MG PO TABS: 7.5000 mg | ORAL_TABLET | Freq: Every day | ORAL | 2 refills | Status: DC

## 2018-03-01 SURGICAL SUPPLY — 79 items
ANCH SUT KNTLS STRL SHLDR SYS (Anchor) ×1 IMPLANT
ANCHOR SUT QUATTRO KNTLS 4.5 (Anchor) ×2 IMPLANT
BANDAGE ACE 6X5 VEL STRL LF (GAUZE/BANDAGES/DRESSINGS) ×1 IMPLANT
BANDAGE ESMARK 6X9 LF (GAUZE/BANDAGES/DRESSINGS) ×1 IMPLANT
BIT DRILL CANN 3.5MM (DRILL) IMPLANT
BLADE SURG 10 STRL SS (BLADE) ×3 IMPLANT
BLADE SURG 15 STRL LF DISP TIS (BLADE) ×1 IMPLANT
BLADE SURG 15 STRL SS (BLADE) ×3
BNDG CMPR 9X6 STRL LF SNTH (GAUZE/BANDAGES/DRESSINGS) ×1
BNDG CMPR MED 15X6 ELC VLCR LF (GAUZE/BANDAGES/DRESSINGS) ×1
BNDG COHESIVE 4X5 TAN STRL (GAUZE/BANDAGES/DRESSINGS) ×1 IMPLANT
BNDG COHESIVE 6X5 TAN STRL LF (GAUZE/BANDAGES/DRESSINGS) ×1 IMPLANT
BNDG ELASTIC 6X15 VLCR STRL LF (GAUZE/BANDAGES/DRESSINGS) ×2 IMPLANT
BNDG ESMARK 6X9 LF (GAUZE/BANDAGES/DRESSINGS) ×3
BNDG GAUZE ELAST 4 BULKY (GAUZE/BANDAGES/DRESSINGS) ×1 IMPLANT
CHLORAPREP W/TINT 26ML (MISCELLANEOUS) ×9 IMPLANT
CLOSURE STERI-STRIP 1/2X4 (GAUZE/BANDAGES/DRESSINGS) ×1
CLSR STERI-STRIP ANTIMIC 1/2X4 (GAUZE/BANDAGES/DRESSINGS) ×1 IMPLANT
COVER MAYO STAND STRL (DRAPES) ×3 IMPLANT
COVER SURGICAL LIGHT HANDLE (MISCELLANEOUS) ×3 IMPLANT
COVER WAND RF STERILE (DRAPES) ×1 IMPLANT
CUFF TOURNIQUET SINGLE 34IN LL (TOURNIQUET CUFF) IMPLANT
CUFF TOURNIQUET SINGLE 44IN (TOURNIQUET CUFF) IMPLANT
DRAPE C-ARM 42X72 X-RAY (DRAPES) ×3 IMPLANT
DRAPE C-ARMOR (DRAPES) ×1 IMPLANT
DRAPE IMP U-DRAPE 54X76 (DRAPES) ×2 IMPLANT
DRAPE INCISE IOBAN 66X45 STRL (DRAPES) ×3 IMPLANT
DRAPE U-SHAPE 47X51 STRL (DRAPES) ×3 IMPLANT
DRILL CANN 3.5MM (DRILL) ×3
DRSG AQUACEL AG ADV 3.5X10 (GAUZE/BANDAGES/DRESSINGS) ×2 IMPLANT
DRSG PAD ABDOMINAL 8X10 ST (GAUZE/BANDAGES/DRESSINGS) ×17 IMPLANT
ELECT REM PT RETURN 9FT ADLT (ELECTROSURGICAL) ×3
ELECTRODE REM PT RTRN 9FT ADLT (ELECTROSURGICAL) ×1 IMPLANT
GAUZE SPONGE 4X4 12PLY STRL (GAUZE/BANDAGES/DRESSINGS) ×1 IMPLANT
GAUZE SPONGE 4X4 12PLY STRL LF (GAUZE/BANDAGES/DRESSINGS) ×1 IMPLANT
GAUZE XEROFORM 1X8 LF (GAUZE/BANDAGES/DRESSINGS) ×1 IMPLANT
GLOVE BIOGEL PI IND STRL 8 (GLOVE) ×1 IMPLANT
GLOVE BIOGEL PI INDICATOR 8 (GLOVE) ×2
GLOVE ECLIPSE 8.0 STRL XLNG CF (GLOVE) ×6 IMPLANT
GOWN STRL REUS W/ TWL LRG LVL3 (GOWN DISPOSABLE) ×2 IMPLANT
GOWN STRL REUS W/ TWL XL LVL3 (GOWN DISPOSABLE) ×2 IMPLANT
GOWN STRL REUS W/TWL LRG LVL3 (GOWN DISPOSABLE) ×6
GOWN STRL REUS W/TWL XL LVL3 (GOWN DISPOSABLE) ×6
IMMOBILIZER KNEE 22 UNIV (SOFTGOODS) IMPLANT
K-WIRE 1.8 (WIRE) ×3
K-WIRE FX200X1.8XTHRD TROC (WIRE) ×1
KIT BASIN OR (CUSTOM PROCEDURE TRAY) ×3 IMPLANT
KIT TURNOVER KIT B (KITS) ×3 IMPLANT
KWIRE FX200X1.8XTHRD TROC (WIRE) IMPLANT
MANIFOLD NEPTUNE II (INSTRUMENTS) ×3 IMPLANT
NS IRRIG 1000ML POUR BTL (IV SOLUTION) ×3 IMPLANT
PACK ORTHO EXTREMITY (CUSTOM PROCEDURE TRAY) ×6 IMPLANT
PAD ABD 8X10 STRL (GAUZE/BANDAGES/DRESSINGS) IMPLANT
PAD ARMBOARD 7.5X6 YLW CONV (MISCELLANEOUS) ×6 IMPLANT
PAD CAST 4YDX4 CTTN HI CHSV (CAST SUPPLIES) ×1 IMPLANT
PADDING CAST COTTON 4X4 STRL (CAST SUPPLIES)
PADDING CAST COTTON 6X4 STRL (CAST SUPPLIES) ×9 IMPLANT
PLATE SPIDER 16 (Washer) ×2 IMPLANT
SCREW CANN 5.0X48MM TI (Screw) ×2 IMPLANT
SPLINT PLASTER CAST XFAST 5X30 (CAST SUPPLIES) IMPLANT
SPLINT PLASTER XFAST SET 5X30 (CAST SUPPLIES) ×6
SPONGE LAP 18X18 X RAY DECT (DISPOSABLE) IMPLANT
STAPLER VISISTAT 35W (STAPLE) ×3 IMPLANT
STOCKINETTE IMPERVIOUS LG (DRAPES) ×3 IMPLANT
SUCTION FRAZIER HANDLE 10FR (MISCELLANEOUS) ×2
SUCTION TUBE FRAZIER 10FR DISP (MISCELLANEOUS) ×1 IMPLANT
SUT BROADBAND TAPE 2PK 1.5 (SUTURE) ×2 IMPLANT
SUT MON AB 3-0 SH 27 (SUTURE) ×3
SUT MON AB 3-0 SH27 (SUTURE) ×1 IMPLANT
SUT VIC AB 0 CT1 18XCR BRD 8 (SUTURE) ×1 IMPLANT
SUT VIC AB 0 CT1 8-18 (SUTURE) ×6
SUT VIC AB 1 CT1 27 (SUTURE) ×3
SUT VIC AB 1 CT1 27XBRD ANBCTR (SUTURE) ×1 IMPLANT
TOWEL OR 17X24 6PK STRL BLUE (TOWEL DISPOSABLE) ×3 IMPLANT
TOWEL OR 17X26 10 PK STRL BLUE (TOWEL DISPOSABLE) ×6 IMPLANT
TUBE CONNECTING 12'X1/4 (SUCTIONS)
TUBE CONNECTING 12X1/4 (SUCTIONS) ×1 IMPLANT
WATER STERILE IRR 1000ML POUR (IV SOLUTION) ×1 IMPLANT
YANKAUER SUCT BULB TIP NO VENT (SUCTIONS) ×3 IMPLANT

## 2018-03-01 NOTE — Anesthesia Procedure Notes (Addendum)
Procedure Name: LMA Insertion Date/Time: 03/01/2018 11:34 AM Performed by: Quentin OreWalker, Denyla Cortese E, CRNA Pre-anesthesia Checklist: Patient identified, Emergency Drugs available, Suction available and Patient being monitored Patient Re-evaluated:Patient Re-evaluated prior to induction Oxygen Delivery Method: Circle System Utilized Preoxygenation: Pre-oxygenation with 100% oxygen Induction Type: IV induction LMA: LMA inserted LMA Size: 4.0 Number of attempts: 1 Placement Confirmation: positive ETCO2 and breath sounds checked- equal and bilateral Tube secured with: Tape Dental Injury: Teeth and Oropharynx as per pre-operative assessment

## 2018-03-01 NOTE — H&P (Signed)
See consult note from same date and same author

## 2018-03-01 NOTE — Transfer of Care (Signed)
Immediate Anesthesia Transfer of Care Note  Patient: Tonya Walls  Procedure(s) Performed: OPEN REDUCTION INTERNAL FIXATION (ORIF)LEFT  TIBIAL TUBERCLE (Left )  Patient Location: PACU  Anesthesia Type:GA combined with regional for post-op pain  Level of Consciousness: awake, alert  and oriented  Airway & Oxygen Therapy: Patient Spontanous Breathing and Patient connected to nasal cannula oxygen  Post-op Assessment: Report given to RN, Post -op Vital signs reviewed and stable and Patient moving all extremities  Post vital signs: Reviewed and stable  Last Vitals:  Vitals Value Taken Time  BP 128/67 03/01/2018  1:10 PM  Temp    Pulse 96 03/01/2018  1:13 PM  Resp 13 03/01/2018  1:13 PM  SpO2 84 % 03/01/2018  1:13 PM  Vitals shown include unvalidated device data.  Last Pain:  Vitals:   03/01/18 0946  PainSc: 10-Worst pain ever      Patients Stated Pain Goal: 2 (03/01/18 0946)  Complications: No apparent anesthesia complications

## 2018-03-01 NOTE — Anesthesia Preprocedure Evaluation (Signed)
Anesthesia Evaluation  Patient identified by MRN, date of birth, ID band Patient awake    Reviewed: Allergy & Precautions, NPO status , Patient's Chart, lab work & pertinent test results  History of Anesthesia Complications Negative for: history of anesthetic complications  Airway Mallampati: I  TM Distance: >3 FB Neck ROM: Full    Dental  (+) Teeth Intact,    Pulmonary neg shortness of breath, neg sleep apnea, neg COPD, neg recent URI, former smoker,    breath sounds clear to auscultation       Cardiovascular negative cardio ROS   Rhythm:Regular     Neuro/Psych negative neurological ROS  negative psych ROS   GI/Hepatic negative GI ROS, Neg liver ROS,   Endo/Other  Morbid obesity  Renal/GU negative Renal ROS     Musculoskeletal left patella tendon rupture, left tibal tubercle fracture   Abdominal   Peds  Hematology negative hematology ROS (+)   Anesthesia Other Findings   Reproductive/Obstetrics                             Anesthesia Physical Anesthesia Plan  ASA: III  Anesthesia Plan: General and Regional   Post-op Pain Management:  Regional for Post-op pain   Induction: Intravenous  PONV Risk Score and Plan: 3 and Ondansetron and Dexamethasone  Airway Management Planned: Oral ETT and LMA  Additional Equipment: None  Intra-op Plan:   Post-operative Plan: Extubation in OR  Informed Consent: I have reviewed the patients History and Physical, chart, labs and discussed the procedure including the risks, benefits and alternatives for the proposed anesthesia with the patient or authorized representative who has indicated his/her understanding and acceptance.   Dental advisory given  Plan Discussed with: CRNA and Surgeon  Anesthesia Plan Comments:         Anesthesia Quick Evaluation

## 2018-03-01 NOTE — Consult Note (Signed)
ORTHOPAEDIC CONSULTATION  REQUESTING PHYSICIAN: Hiram Gash, MD  Chief Complaint: Left tibial tubercle avulsion  HPI: Tonya Walls is a 27 y.o. female with left tibial tubercle avulsion fracture after fall down some stairs.  Patient's BMI is 51 and required main hospital for management.  She was seen by 1 of my partners Dr. Marchia Bond but he is scheduled was unable to handle her case and thus he asked for my assistance.  Patient unable to make a straight leg raise and had significant pain.  Her swelling is been under control and otherwise been doing well at home.  Past Medical History:  Diagnosis Date  . Avulsion fracture of tibial tuberosity    acute, left knee  . Bicornuate uterus   . Obesity, Class III, BMI 40-49.9 (morbid obesity) ([place])   . Wears glasses    Past Surgical History:  Procedure Laterality Date  . WISDOM TOOTH EXTRACTION     Social History   Socioeconomic History  . Marital status: Single    Spouse name: Not on file  . Number of children: Not on file  . Years of education: Not on file  . Highest education level: Not on file  Occupational History  . Not on file  Social Needs  . Financial resource strain: Not on file  . Food insecurity:    Worry: Not on file    Inability: Not on file  . Transportation needs:    Medical: Not on file    Non-medical: Not on file  Tobacco Use  . Smoking status: Former Smoker    Types: Cigarettes    Last attempt to quit: 11/13/2012    Years since quitting: 5.2  . Smokeless tobacco: Never Used  Substance and Sexual Activity  . Alcohol use: No  . Drug use: No  . Sexual activity: Yes    Birth control/protection: None  Lifestyle  . Physical activity:    Days per week: Not on file    Minutes per session: Not on file  . Stress: Not on file  Relationships  . Social connections:    Talks on phone: Not on file    Gets together: Not on file    Attends religious service: Not on file    Active member of club or  organization: Not on file    Attends meetings of clubs or organizations: Not on file    Relationship status: Not on file  Other Topics Concern  . Not on file  Social History Narrative  . Not on file   Family History  Problem Relation Age of Onset  . Diabetes Father    No Known Allergies Prior to Admission medications   Medication Sig Start Date End Date Taking? Authorizing Provider  ibuprofen (ADVIL,MOTRIN) 800 MG tablet Take 1 tablet (800 mg total) by mouth every 8 (eight) hours as needed. Patient taking differently: Take 800 mg by mouth every 8 (eight) hours as needed (pain).  12/22/15  Yes Marcille Buffy D, CNM  oxyCODONE-acetaminophen (PERCOCET/ROXICET) 5-325 MG tablet Take 1 tablet by mouth every 4 (four) hours as needed for severe pain. 02/25/18  Yes McDonald, Mia A, PA-C   No results found. Family History Reviewed and non-contributory, no pertinent history of problems with bleeding or anesthesia      Review of Systems 14 system ROS conducted and negative except for that noted in HPI   OBJECTIVE  Vitals: Patient Vitals for the past 8 hrs:  BP Pulse Resp SpO2 Height  Weight  03/01/18 1030 - 87 14 100 % - -  03/01/18 1025 102/65 91 14 100 % - -  03/01/18 1020 (!) 124/58 88 17 100 % - -  03/01/18 1015 123/71 88 17 100 % - -  03/01/18 1010 115/67 87 16 99 % - -  03/01/18 1005 (!) 128/50 96 20 99 % - -  03/01/18 0937 - - - - - (!) 137 kg  03/01/18 0916 (!) 118/52 99 20 99 % '5\' 3"'$  (1.6 m) -   General: Alert, no acute distress Cardiovascular: No pedal edema Respiratory: No cyanosis, no use of accessory musculature GI: No organomegaly, abdomen is soft and non-tender Skin: No lesions in the area of chief complaint other than those listed below in MSK exam.  Neurologic:Sensation intact distally save for the below mentioned MSK exam Psychiatric: Patient is competent for consent with normal mood and affect Lymphatic: No axillary or cervical lymphadenopathy Extremities    Lower extremity.  Distal motor and sensory function is intact including EHL/TA/GSC, tender palpation at the tibial tubercle unable to perform straight leg raise.  No other areas of tenderness palpation.  Apartment soft and compressible now 3 days out from injury.    Test Results Imaging Imaging reviewed x-rays demonstrated a comminuted tibial tubercle avulsion fracture with possible proximal migration of the patella.  Labs cbc Recent Labs    03/01/18 0924  HGB 9.1*    Labs inflam No results for input(s): CRP in the last 72 hours.  Invalid input(s): ESR  Labs coag No results for input(s): INR, PTT in the last 72 hours.  Invalid input(s): PT  No results for input(s): NA, K, CL, CO2, GLUCOSE, BUN, CREATININE, CALCIUM in the last 72 hours.   ASSESSMENT AND PLAN: 27 y.o. female with the following: Tibial tubercle avulsion fracture.  Talk to the patient about her options.  She had a discussion about surgery with Dr. Mardelle Matte already.  We talked with the fact that without extensor mechanism is intact in this young otherwise healthy patient with obesity she would likely not be able to straighten her knee and ambulation to be very difficult.  Feel the patient would benefit from surgical management.  We will work on fracture fixation versus excision and repair of the patellar tendon.  She understands that this will be an extensive rehabilitation and she will not be able to put weight on the leg until we allow her to in clinic.  She will likely need physical therapy as well to manage her range of motion and stiffness.  Unfortunately she has no insurance at this may be difficult.  This may limit her overall outcomes however we still feel surgery is beneficial for the patient.  The risks benefits and alternatives were discussed with the patient including but not limited to the risks of nonoperative treatment, versus surgical intervention including infection, bleeding, nerve injury, periprosthetic  fracture, the need for revision surgery, leg length discrepancy, gait change, blood clots, cardiopulmonary complications, morbidity, mortality, among others, and they were willing to proceed.

## 2018-03-01 NOTE — Op Note (Signed)
Orthopaedic Surgery Operative Note (CSN: [id number])  Tonya Walls  January 22, 1991 Date of Surgery: 03/01/2018   Diagnoses:  left patella tendon rupture, left tibal tubercle fracture  Procedure: 27380 patellar tendon repair [phone] proximal tibial tubercle fracture ORIF 94801 Retinacular repair medial 27405 Retinacular repair lateral    Operative Finding Successful completion of planned procedure.  Patient had a significant injury with the lateral aspect of her patellar tendon avulsing a fragment of bone from her tubercle.  This was fixed with a spider washer.  The medial aspect 20% of the patellar tendon was avulsed in a atypical fashion off of the proximal patellar tendon.  Not amenable to fix right back to the patella but was imbricated into the repaired lateral substance which was the majority of the tendon itself.  Medial lateral met retinacular repairs were performed.  Good fixation was backed up with a Biomet Quatro knotless anchor.  Post-operative plan: The patient will be in a 3 slab splint nonweightbearing.  The patient will be charged home.  DVT prophylaxis aspirin 325 twice daily in this otherwise healthy ambulatory patient.  Pain control with PRN pain medication preferring oral medicines.  Follow up plan will be scheduled in approximately 7 days for incision check and XR.  Post-Op Diagnosis: Same Surgeons:Primary: Hiram Gash, MD Assistants: Joya Gaskins, OPAC Location: [facility] OR ROOM 06 Anesthesia: Choice Antibiotics: Ancef 2g preop, Vancomycin '1000mg'$  locally Tourniquet time: Patient body habitus prevented tourniquet use Estimated Blood Loss: 50 Complications: None Specimens: None Implants: Implant Name Type Inv. Item Serial No. Manufacturer Lot No. LRB No. Used Action  ANCHOR QUATTRO KNOTLESS 4.5MM - [id number] Anchor ANCHOR QUATTRO KNOTLESS 4.5MM  ZIMMER RECON(ORTH,TRAU,BIO,SG) [id number] Left 1 Implanted  WASHER ACE SPIDER - [id number] Washer WASHER ACE SPIDER  ZIMMER  RECON(ORTH,TRAU,BIO,SG)  Left 1 Implanted  SCREW CANN 5.0X48MM TI - [id number] Screw SCREW CANN 5.0X48MM TI  ZIMMER RECON(ORTH,TRAU,BIO,SG)  Left 1 Implanted    Indications for Surgery:   Tonya Walls is a 27 y.o. female with fall down stairs and a BMI 51 resulting in a patellar tendon injury and loss of extensor mechanism.  Patient was unable to ambulate and required surgery in the setting of a young patient with loss of her extensor mechanism in order to regain good function of her extensor mechanism.  Benefits and risks of operative and nonoperative management were discussed prior to surgery with patient/guardian(s) and informed consent form was completed.  Specific risks including infection, need for additional surgery, nonunion, repeat rupture, stiffness, infection.   Procedure:   The patient was identified in the preoperative holding area where the surgical site was marked. The patient was taken to the OR where a procedural timeout was called and the above noted anesthesia was induced.  The patient was positioned supine on a regular bed.  Preoperative antibiotics were dosed.  The patient's left knee was prepped and draped in the usual sterile fashion.  A second preoperative timeout was called.      We began with a anterior approach to the knee.  With her skin sharply achieving hemostasis we progressed and identified the patellar peritenon which was ruptured proximally.  We opened this longitudinally were able to identify the medial lateral borders where the patellar tendon should be.  The lateral aspect of the patellar tendon had been avulsed with a small bone fragment attached.  This was cleared of soft tissue attachments as was its bed.  We then identified that the medial 20% of the tendon itself avulsed  from proximally in a typical fashion that was not amenable to fix back to the patella itself for fear of creating too much patella [place].  We mobilized these fragments were able to identify medial  and lateral retinacular ruptures.  We repaired these with #1 Vicryl in a figure-of-eight fashion medially and laterally with multiple interrupted sutures performing a watertight repair.  This point we began with our repair of the patellar tendon the extensor mechanism.  We whipstitched the lateral 80% of the tendon that it avulsed from distal and did this with 2 Biomet suture tapes in a Krakw fashion.  Once he had good fixation of the tendon itself we then proceeded to fix the tubercle avulsion.  It was reduced and held in place with a tenaculum on the K wires used to hold it in place.  Fluoroscopic images demonstrated this was in the appropriate position.  We then used a Biomet medium spider wire and a 5.0 mm cannulated screw to perform unicortical fixation with a spider washer and achieve good purchase of the bone and fixation of the fragment confirmed on fluoroscopy.  We then backed up this fixation with a Biomet knotless Quatro anchor that was placed in the medial aspect of the tibia and passing all 4 limbs of the Krakw stitches that were in the patellar tendon itself into this anchor.  This was secured while preventing overtightening of the tendon itself.  We then assessed the medial 20% of the tendon that had avulsed from proximal and noted that we really could not repair it back to the patella without creating significant shortening with patellar tendon.  We instead reefed it to the intact recently repaired lateral aspect of the patella and this allowed for good fixation and adding collagen to the repair itself.    The incision was thoroughly irrigated and closed in a multilayer fashion with absorbable sutures. A sterile dressing was placed.  A 3 sided splint was placed.  The patient was awoken from general anesthesia and taken to the PACU in stable condition without complication.   Joya Gaskins, OPA-C, present and scrubbed throughout the case, critical for completion in a timely fashion, and for  retraction, instrumentation, closure.

## 2018-03-01 NOTE — Anesthesia Procedure Notes (Signed)
Anesthesia Regional Block: Femoral nerve block   Pre-Anesthetic Checklist: ,, timeout performed, Correct Patient, Correct Site, Correct Laterality, Correct Procedure, Correct Position, site marked, Risks and benefits discussed,  Surgical consent,  Pre-op evaluation,  At surgeon's request and post-op pain management  Laterality: Left  Prep: chloraprep       Needles:  Injection technique: Single-shot     Needle Length: 9cm  Needle Gauge: 22     Additional Needles: Arrow StimuQuik ECHO Echogenic Stimulating PNB Needle  Procedures:,,,, ultrasound used (permanent image in chart),,,,  Narrative:  Start time: 03/01/2018 10:17 AM End time: 03/01/2018 10:23 AM Injection made incrementally with aspirations every 5 mL.  Performed by: Personally  Anesthesiologist: Val EagleMoser, Jadyn Barge, MD

## 2018-03-02 NOTE — Anesthesia Postprocedure Evaluation (Signed)
Anesthesia Post Note  Patient: Deniece L Lapidus  Procedure(s) Performed: OPEN REDUCTION INTERNAL FIXATION (ORIF)LEFT  TIBIAL TUBERCLE (Left )     Patient location during evaluation: PACU Anesthesia Type: Regional and General Level of consciousness: awake and alert Pain management: pain level controlled Vital Signs Assessment: post-procedure vital signs reviewed and stable Respiratory status: spontaneous breathing, nonlabored ventilation, respiratory function stable and patient connected to nasal cannula oxygen Cardiovascular status: blood pressure returned to baseline and stable Postop Assessment: no apparent nausea or vomiting Anesthetic complications: no    Last Vitals:  Vitals:   03/01/18 1424 03/01/18 1450  BP: 102/61 (!) 106/54  Pulse: 89   Resp: 14   Temp:    SpO2: 92% 95%    Last Pain:  Vitals:   03/01/18 1424  PainSc: Asleep                 Yuvonne Lanahan

## 2018-03-06 ENCOUNTER — Encounter (HOSPITAL_COMMUNITY): Payer: Self-pay | Admitting: Orthopaedic Surgery

## 2018-08-23 ENCOUNTER — Encounter: Payer: Self-pay | Admitting: *Deleted

## 2018-12-06 ENCOUNTER — Encounter (HOSPITAL_COMMUNITY): Payer: Self-pay

## 2018-12-06 ENCOUNTER — Emergency Department (HOSPITAL_COMMUNITY)
Admission: EM | Admit: 2018-12-06 | Discharge: 2018-12-06 | Disposition: A | Discharge status: Home / Self Care | Payer: Medicaid Other | Attending: Emergency Medicine | Admitting: Emergency Medicine

## 2018-12-06 ENCOUNTER — Other Ambulatory Visit: Payer: Self-pay

## 2018-12-06 DIAGNOSIS — Z87891 Personal history of nicotine dependence: Secondary | ICD-10-CM | POA: Insufficient documentation

## 2018-12-06 DIAGNOSIS — Z79899 Other long term (current) drug therapy: Secondary | ICD-10-CM | POA: Insufficient documentation

## 2018-12-06 DIAGNOSIS — N63 Unspecified lump in unspecified breast: Secondary | ICD-10-CM | POA: Insufficient documentation

## 2018-12-06 DIAGNOSIS — R55 Syncope and collapse: Secondary | ICD-10-CM | POA: Insufficient documentation

## 2018-12-06 NOTE — ED Provider Notes (Signed)
[facility] EMERGENCY DEPARTMENT Provider Note   CSN: [id number] Arrival date & time: 12/06/18  1820     History   Chief Complaint Chief Complaint  Patient presents with  . Near Syncope    HPI Tonya Walls is a 28 y.o. female.     HPI   28 year old female presents today with near syncopal episode.  Patient notes that she was at work when she started feeling weak.  She notes she tried to lay down at that time.  Patient's sister who is also working with her notes that they were gowned with numerous have on its masks and other lower extremity sanitary covers.  She notes it was slightly warm in the place.  She saw her sister starting to get weak, lay her hands on the table and then lay her head on her hands.  She notes she was standing throughout this with no fall or presyncopal episode.  They moved her to a chair and into the cooler to help cool her down.  They called EMS.  Patient denies any chest pain shortness of breath palpitations, racing heart abdominal pain, neurologic deficits, or any other concerning signs or symptoms.  She denies any history of the same, denies any cardiac issues.  No swelling in the lower edema.    Past Medical History:  Diagnosis Date  . Avulsion fracture of tibial tuberosity    acute, left knee  . Bicornuate uterus   . Obesity, Class III, BMI 40-49.9 (morbid obesity) ([facility])   . Wears glasses     Patient Active Problem List   Diagnosis Date Noted  . Pregnancy 12/15/2015  . Obesity in pregnancy with BMI of 50 , antepartum 11/27/2015  . Group B Streptococcus carrier, +RV culture, currently pregnant 11/25/2015  . Supervision of high-risk pregnancy 05/28/2015  . Bicornuate uterus affecting pregnancy, antepartum 04/15/2015    Past Surgical History:  Procedure Laterality Date  . OPEN REDUCTION INTERNAL FIXATION (ORIF) TIBIAL TUBERCLE Left 03/01/2018   Procedure: OPEN REDUCTION INTERNAL FIXATION (ORIF)LEFT  TIBIAL TUBERCLE;  Surgeon:  Bjorn Pippin, MD;  Location: [facility] OR;  Service: Orthopedics;  Laterality: Left;  . WISDOM TOOTH EXTRACTION       OB History    Gravida  1   Para  1   Term  1   Preterm  0   AB  0   Living  1     SAB  0   TAB  0   Ectopic  0   Multiple  0   Live Births  1            Home Medications    Prior to Admission medications   Medication Sig Start Date End Date Taking? Authorizing Provider  meloxicam (MOBIC) 7.5 MG tablet Take 1 tablet (7.5 mg total) by mouth daily. 03/01/18 03/01/19  Bjorn Pippin, MD    Family History Family History  Problem Relation Age of Onset  . Diabetes Father     Social History Social History   Tobacco Use  . Smoking status: Former Smoker    Types: Cigarettes    Quit date: 11/13/2012    Years since quitting: 6.0  . Smokeless tobacco: Never Used  Substance Use Topics  . Alcohol use: No  . Drug use: No     Allergies   Patient has no known allergies.   Review of Systems Review of Systems  All other systems reviewed and are negative.  Physical Exam Updated  Vital Signs BP 90/76   Pulse 87   Temp 98.7 F (37.1 C) (Oral)   Resp 15   LMP 11/15/2018 (Exact Date)   SpO2 100%   Physical Exam Vitals signs and nursing note reviewed.  Constitutional:      Appearance: She is well-developed.     Comments: Morbidly obese   HENT:     Head: Normocephalic and atraumatic.  Eyes:     General: No scleral icterus.       Right eye: No discharge.        Left eye: No discharge.     Conjunctiva/sclera: Conjunctivae normal.     Pupils: Pupils are equal, round, and reactive to light.  Neck:     Musculoskeletal: Normal range of motion.     Vascular: No JVD.     Trachea: No tracheal deviation.  Cardiovascular:     Rate and Rhythm: Normal rate and regular rhythm.  Pulmonary:     Effort: Pulmonary effort is normal. No respiratory distress.     Breath sounds: Normal breath sounds. No stridor. No wheezing or rales.  Chest:        Comments: 0.5 cm nodule at the 3 o'clock position right areola region-mobile, no discharge, no dimpling, remainder of breast and axilla without masses or adenopathy Neurological:     Mental Status: She is alert and oriented to person, place, and time.     Coordination: Coordination normal.  Psychiatric:        Behavior: Behavior normal.        Thought Content: Thought content normal.        Judgment: Judgment normal.     ED Treatments / Results  Labs (all labs ordered are listed, but only abnormal results are displayed) Labs Reviewed - No data to display  EKG None  Radiology No results found.  Procedures Procedures (including critical care time)  Medications Ordered in ED Medications - No data to display   Initial Impression / Assessment and Plan / ED Course  I have reviewed the triage vital signs and the nursing notes.  Pertinent labs & imaging results that were available during my care of the patient were reviewed by me and considered in my medical decision making (see chart for details).         Assessment/Plan: 28 year old female presents today with complaints of presyncopal episode.  She had no concerning signs or symptoms preceding this.  She had no syncopal episode, low suspicion for cardiac or pulmonary etiology.  She is monitored here in the ED, I personally ambulated her she has no chest pain or shortness of breath.  She is very well-appearing no acute distress.  She will be discharged with return precautions and follow-up information.  Patient also has a nodule on her breast.  She has already tried to follow-up as an outpatient with OB/GYN for this.  I do encourage she continue this follow-up evaluation at the breast center and with her primary care or OB/GYN.  She will follow-up within the next week for this.  Return precautions given.  Verbalized understanding and agreement to today's plan.    Final Clinical Impressions(s) / ED Diagnoses   Final diagnoses:   Near syncope  Breast nodule    ED Discharge Orders    None       Francee Gentile 12/06/18 2006    [address], DO 12/06/18 2013

## 2018-12-06 NOTE — ED Notes (Signed)
Patient verbalizes understanding of discharge instructions. Opportunity for questioning and answers were provided. Armband removed by staff, pt discharged from ED.  

## 2018-12-06 NOTE — Discharge Instructions (Signed)
As we discussed you need close follow-up as an outpatient at the breast center for evaluation of your breast nodule.  Please make this follow-up evaluation within the next week.  Please stay hydrated rest and return immediately if develop any new or worsening signs or symptoms.

## 2018-12-06 NOTE — ED Triage Notes (Signed)
[facility] reports pt had a near syncopal episode at work, with some discomfort in the upper chest/throat. Reports having similar pain for months. Also reports a lump in right breast.

## 2018-12-18 ENCOUNTER — Other Ambulatory Visit (HOSPITAL_COMMUNITY)
Admission: RE | Admit: 2018-12-18 | Discharge: 2018-12-18 | Disposition: A | Discharge status: Home / Self Care | Payer: Medicaid Other | Source: Ambulatory Visit | Attending: Family Medicine | Admitting: Family Medicine

## 2018-12-18 ENCOUNTER — Other Ambulatory Visit: Payer: Self-pay

## 2018-12-18 ENCOUNTER — Encounter: Payer: Self-pay | Admitting: Family Medicine

## 2018-12-18 ENCOUNTER — Ambulatory Visit (INDEPENDENT_AMBULATORY_CARE_PROVIDER_SITE_OTHER): Payer: Self-pay | Admitting: Family Medicine

## 2018-12-18 VITALS — BP 138/75 | HR 92 | Temp 98.4°F | Ht 63.0 in | Wt 320.0 lb

## 2018-12-18 DIAGNOSIS — Z0000 Encounter for general adult medical examination without abnormal findings: Secondary | ICD-10-CM

## 2018-12-18 DIAGNOSIS — N644 Mastodynia: Secondary | ICD-10-CM

## 2018-12-18 NOTE — Progress Notes (Signed)
GYNECOLOGY OFFICE VISIT NOTE  History:   Tonya Walls is a 28 y.o. G1P1001 here today for breast pain. She denies any abnormal vaginal discharge, bleeding, pelvic pain or other concerns.  Noted pain near right nipple about 3 months ago and pain has been constant. She has not taken anything for pain. No skin changes noted. No nipple discharge. Reports regular periods. Reports hx of breast cancer on her dad's side. Denies fever, chills, nausea, vomiting or any other complaints.    Past Medical History:  Diagnosis Date  . Avulsion fracture of tibial tuberosity    acute, left knee  . Bicornuate uterus   . Obesity, Class III, BMI 40-49.9 (morbid obesity) ([place])   . Wears glasses     Past Surgical History:  Procedure Laterality Date  . OPEN REDUCTION INTERNAL FIXATION (ORIF) TIBIAL TUBERCLE Left 03/01/2018   Procedure: OPEN REDUCTION INTERNAL FIXATION (ORIF)LEFT  TIBIAL TUBERCLE;  Surgeon: Hiram Gash, MD;  Location: [facility];  Service: Orthopedics;  Laterality: Left;  . WISDOM TOOTH EXTRACTION      The following portions of the patient's history were reviewed and updated as appropriate: allergies, current medications, past family history, past medical history, past social history, past surgical history and problem list.   Health Maintenance:  Normal pap and negative March 2017.   Review of Systems:  Pertinent items noted in HPI and remainder of comprehensive ROS otherwise negative.  Physical Exam:  BP 138/75   Pulse 92   Temp 98.4 F (36.9 C)   Ht 5\' 3"  (1.6 m)   Wt (!) 320 lb (145.2 kg)   LMP 12/14/2018 (Exact Date)   BMI 56.69 kg/m  CONSTITUTIONAL: Well-developed, well-nourished female in no acute distress. Obese.  HEENT:  Normocephalic, atraumatic. External right and left ear normal. No scleral icterus.  NECK: Normal range of motion, supple, no masses noted on observation SKIN: No rash noted. Not diaphoretic. No erythema. No pallor. MUSCULOSKELETAL: Normal range of motion.  No edema noted. NEUROLOGIC: Alert and oriented to person, place, and time. Normal muscle tone coordination. No cranial nerve deficit noted. PSYCHIATRIC: Normal mood and affect. Normal behavior. Normal judgment and thought content. CARDIOVASCULAR: Normal heart rate noted BREAST: No masses, nipple discharge or skin changes noted in axilla or breasts bilaterally.  RESPIRATORY: Effort and breath sounds normal, no problems with respiration noted ABDOMEN: No masses noted. No other overt distention noted.   PELVIC: Normal appearing external genitalia; normal appearing vaginal mucosa and cervix.  No abnormal discharge noted.  Normal uterine size, no other palpable masses, no uterine or adnexal tenderness.  Labs and Imaging No results found for this or any previous visit (from the past 168 hour(s)). No results found.    Assessment and Plan:  Tonya Walls was seen today for breast problem.  Diagnoses and all orders for this visit:  Breast pain -     US BREAST COMPLETE UNI RIGHT INC AXILLA; Future - No abnormalities noted on exam. Difficult to evaluate due to breast size. [place] ordered.  - Discussed conservative measures as well such as proper fitting bras, trial of NSAIDs. No apparent association with menstrual cycle.   Routine health maintenance -     Cytology - PAP( [facility])   Routine preventative health maintenance measures emphasized. Please refer to After Visit Summary for other counseling recommendations.   No follow-ups on file.    Total face-to-face time with patient: 15 minutes.  Over 50% of encounter was spent on counseling and coordination of  care.  Jerilynn Birkenhead, MD [facility] Family Medicine Fellow, [facility] for [facility], [facility]

## 2018-12-25 ENCOUNTER — Other Ambulatory Visit (HOSPITAL_COMMUNITY): Payer: Self-pay | Admitting: *Deleted

## 2018-12-25 DIAGNOSIS — N6310 Unspecified lump in the right breast, unspecified quadrant: Secondary | ICD-10-CM

## 2018-12-26 LAB — CYTOLOGY - PAP
Chlamydia: NEGATIVE
Diagnosis: NEGATIVE
Neisseria Gonorrhea: NEGATIVE

## 2019-01-04 ENCOUNTER — Other Ambulatory Visit (HOSPITAL_COMMUNITY): Payer: Self-pay | Admitting: Obstetrics and Gynecology

## 2019-01-04 ENCOUNTER — Encounter (HOSPITAL_COMMUNITY): Payer: Self-pay

## 2019-01-04 ENCOUNTER — Ambulatory Visit
Admission: RE | Admit: 2019-01-04 | Discharge: 2019-01-04 | Disposition: A | Discharge status: Home / Self Care | Payer: No Typology Code available for payment source | Source: Ambulatory Visit | Attending: Obstetrics and Gynecology | Admitting: Obstetrics and Gynecology

## 2019-01-04 ENCOUNTER — Ambulatory Visit (HOSPITAL_COMMUNITY)
Admission: RE | Admit: 2019-01-04 | Discharge: 2019-01-04 | Disposition: A | Discharge status: Home / Self Care | Payer: Medicaid Other | Source: Ambulatory Visit | Attending: Obstetrics and Gynecology | Admitting: Obstetrics and Gynecology

## 2019-01-04 ENCOUNTER — Other Ambulatory Visit: Payer: Self-pay

## 2019-01-04 DIAGNOSIS — N644 Mastodynia: Secondary | ICD-10-CM | POA: Insufficient documentation

## 2019-01-04 DIAGNOSIS — N6310 Unspecified lump in the right breast, unspecified quadrant: Secondary | ICD-10-CM

## 2019-01-04 DIAGNOSIS — N6315 Unspecified lump in the right breast, overlapping quadrants: Secondary | ICD-10-CM | POA: Insufficient documentation

## 2019-01-04 DIAGNOSIS — Z1239 Encounter for other screening for malignant neoplasm of breast: Secondary | ICD-10-CM | POA: Insufficient documentation

## 2019-01-04 NOTE — Patient Instructions (Signed)
Explained breast self awareness with Evaleigh L Sanborn. Patient did not need a Pap smear today due to last Pap smear was 12/18/2018. Let her know [facility] will cover Pap smears every 3 years unless has a history of abnormal Pap smears. Referred patient to the [facility] for a right breast ultrasound. Appointment scheduled for Thursday, January 04, 2019 at 1020. Patient aware of appointment and will be there. Tonya Walls verbalized understanding.  Tonya Walls, Arvil Chaco, RN 9:29 AM

## 2019-01-04 NOTE — Progress Notes (Signed)
Complaints of right breast lump and pain x 2 months. Patient states the lump has increased in size. Patient states the pain is constant. Patient rates the pain at a 10 out of 10.  Pap Smear: Pap smear not completed today. Last Pap smear was 12/18/2018 at [facility] for [place] and normal. Per patient has no history of an abnormal Pap smear. Last Pap smear result is in Epic.  Physical exam: Breasts Breasts symmetrical. No skin abnormalities bilateral breasts. No nipple retraction bilateral breasts. No nipple discharge bilateral breasts. No lymphadenopathy. No lumps palpated left breast. Palpated a lump within the right breast at 3 o'clock next to the nipple. Complaints of diffuse right breast pain on exam. Referred patient to the [facility] for a right breast ultrasound. Appointment scheduled for Thursday, January 04, 2019 at 1020.        Pelvic/Bimanual No Pap smear completed today since last Pap smear was 12/18/2018. Pap smear not indicated per BCCCP guidelines.   Smoking History: Patient has never smoked.  Patient Navigation: Patient education provided. Access to services provided for patient through B[facility]CP program.   Breast and Cervical Cancer Risk Assessment: Patient has a family history of a paternal cousin having breast cancer. Patient has no known genetic mutations or history of radiation treatment to the chest before age 45. Patient has no history of cervical dysplasia, immunocompromised, or DES exposure in-utero. Breast cancer risk assessment completed. No breast cancer risk calculated due to patient is less than 54 years old.

## 2019-01-10 ENCOUNTER — Other Ambulatory Visit: Payer: Self-pay | Admitting: Obstetrics and Gynecology

## 2019-01-10 MED ORDER — DICLOXACILLIN SODIUM 500 MG PO CAPS: 500.0000 mg | ORAL_CAPSULE | Freq: Four times a day (QID) | ORAL | 0 refills | Status: DC

## 2019-01-11 ENCOUNTER — Telehealth (HOSPITAL_COMMUNITY): Payer: Self-pay | Admitting: *Deleted

## 2019-01-11 NOTE — Telephone Encounter (Signed)
Called patient due to the recommendation from her breast ultrasound was to consider a trial of antibiotics. A prescription for Dicloxacillin has been sent to her pharmacy. Verified pharmacy with patient. Advised patient to take 4 x a day (every 6 hours) and to take on an empty. Patient verbalized understanding.

## 2019-01-17 ENCOUNTER — Telehealth: Payer: Self-pay | Admitting: *Deleted

## 2019-01-17 NOTE — Telephone Encounter (Signed)
Call placed to pt to discuss recent medication that was sent in for her for breast infection, Dicloxacillin. Pt only has [facility] and will not cover this medication. Pt made aware to check with pharmacy and ask for cash price for Rx. Pt made aware that if cost is to high for her she should contact office for alternative tx.  Pt states understanding.

## 2019-03-07 ENCOUNTER — Inpatient Hospital Stay: Admission: RE | Admit: 2019-03-07 | Payer: No Typology Code available for payment source | Source: Ambulatory Visit

## 2019-10-23 ENCOUNTER — Ambulatory Visit (HOSPITAL_COMMUNITY)
Admission: EM | Admit: 2019-10-23 | Discharge: 2019-10-23 | Disposition: A | Discharge status: Home / Self Care | Payer: Self-pay | Attending: Family Medicine | Admitting: Family Medicine

## 2019-10-23 ENCOUNTER — Encounter (HOSPITAL_COMMUNITY): Payer: Self-pay

## 2019-10-23 ENCOUNTER — Ambulatory Visit (INDEPENDENT_AMBULATORY_CARE_PROVIDER_SITE_OTHER): Payer: Self-pay

## 2019-10-23 DIAGNOSIS — M25562 Pain in left knee: Secondary | ICD-10-CM

## 2019-10-23 NOTE — Discharge Instructions (Signed)
Please try ice  Please try using the crutches.  Please follow up if your symptoms fail to improve.

## 2019-10-23 NOTE — ED Provider Notes (Signed)
[facility]    CSN: [id number] Arrival date & time: 10/23/19  1640      History   Chief Complaint Chief Complaint  Patient presents with  . Knee Pain    HPI Tonya Walls is a 29 y.o. female.   She is presenting with left knee pain after fall 2 days ago.  Now she is having pain with extension.  She had a tibial fracture 2 years ago.  Pain is severe and worse with ambulation.  Localized to the knee.  HPI  Past Medical History:  Diagnosis Date  . Avulsion fracture of tibial tuberosity    acute, left knee  . Bicornuate uterus   . Obesity, Class III, BMI 40-49.9 (morbid obesity) (HCC)   . Wears glasses     Patient Active Problem List   Diagnosis Date Noted  . Screening breast examination 01/04/2019  . Breast lump on right side at 3 o'clock position 01/04/2019  . Breast pain, right 01/04/2019  . Pregnancy 12/15/2015  . Obesity in pregnancy with BMI of 50 , antepartum 11/27/2015  . Group B Streptococcus carrier, +RV culture, currently pregnant 11/25/2015  . Supervision of high-risk pregnancy 05/28/2015  . Bicornuate uterus affecting pregnancy, antepartum 04/15/2015    Past Surgical History:  Procedure Laterality Date  . OPEN REDUCTION INTERNAL FIXATION (ORIF) TIBIAL TUBERCLE Left 03/01/2018   Procedure: OPEN REDUCTION INTERNAL FIXATION (ORIF)LEFT  TIBIAL TUBERCLE;  Surgeon: Bjorn Pippin, MD;  Location: [facility] OR;  Service: Orthopedics;  Laterality: Left;  . WISDOM TOOTH EXTRACTION      OB History    Gravida  1   Para  1   Term  1   Preterm  0   AB  0   Living  1     SAB  0   TAB  0   Ectopic  0   Multiple  0   Live Births  1            Home Medications    Prior to Admission medications   Medication Sig Start Date End Date Taking? Authorizing Provider  dicloxacillin (DYNAPEN) 500 MG capsule Take 1 capsule (500 mg total) by mouth 4 (four) times daily. 01/10/19   Constant, Peggy, MD    Family History Family History  Problem  Relation Age of Onset  . Healthy Mother   . Diabetes Father     Social History Social History   Tobacco Use  . Smoking status: Former Smoker    Types: Cigarettes    Quit date: 11/13/2012    Years since quitting: 6.9  . Smokeless tobacco: Never Used  Vaping Use  . Vaping Use: Never used  Substance Use Topics  . Alcohol use: No  . Drug use: No     Allergies   Patient has no known allergies.   Review of Systems Review of Systems  See HPI  Physical Exam Triage Vital Signs ED Triage Vitals  Enc Vitals Group     BP 10/23/19 1752 105/83     Pulse Rate 10/23/19 1752 93     Resp 10/23/19 1752 19     Temp 10/23/19 1752 98.7 F (37.1 C)     Temp src --      SpO2 10/23/19 1752 100 %     Weight --      Height --      Head Circumference --      Peak Flow --      Pain Score 10/23/19  1749 9     Pain Loc --      Pain Edu? --      Excl. in GC? --    No data found.  Updated Vital Signs BP 105/83   Pulse 93   Temp 98.7 F (37.1 C)   Resp 19   LMP 10/01/2019   SpO2 100%   Visual Acuity Right Eye Distance:   Left Eye Distance:   Bilateral Distance:    Right Eye Near:   Left Eye Near:    Bilateral Near:     Physical Exam Gen: NAD, alert, cooperative with exam, well-appearing ENT: normal lips, normal nasal mucosa,  Eye: normal EOM, normal conjunctiva and lids Skin: no rashes, no areas of induration  Neuro: normal tone, normal sensation to touch Psych:  normal insight, alert and oriented MSK:  Left knee: No obvious effusion. Tenderness to palpation of the lateral joint line. Tenderness palpation at the insertion of the patellar tendon. Limited flexion. Normal extension. Neurovascularly intact   UC Treatments / Results  Labs (all labs ordered are listed, but only abnormal results are displayed) Labs Reviewed - No data to display  EKG   Radiology DG Knee 2 Views Left  Result Date: 10/23/2019 CLINICAL DATA:  Left knee pain EXAM: LEFT KNEE - 1-2 VIEW  COMPARISON:  03/01/2018 FINDINGS: Postoperative changes in the proximal tibia at the patellar tendon insertion. Soft tissue calcifications in the infrapatellar region, likely related to old injury. No acute fracture, subluxation or dislocation. No joint effusion. IMPRESSION: No acute bony abnormality. Electronically Signed   By: Charlett Nose M.D.   On: 10/23/2019 18:52    Procedures Procedures (including critical care time)  Medications Ordered in UC Medications - No data to display  Initial Impression / Assessment and Plan / UC Course  I have reviewed the triage vital signs and the nursing notes.  Pertinent labs & imaging results that were available during my care of the patient were reviewed by me and considered in my medical decision making (see chart for details).     [name] is a 29 year old female that is presenting with left knee pain after a fall. She has a history of surgery in the same knee. Has some pain with flexion and extension. No signs of quad or patellar tendon rupture. Possible for contusion. Imaging was negative for fracture. Placed in a hinged knee brace and provided crutches. Counseled supportive care. Give indications follow-up.  Final Clinical Impressions(s) / UC Diagnoses   Final diagnoses:  Acute pain of left knee     Discharge Instructions     Please try ice  Please try using the crutches.  Please follow up if your symptoms fail to improve.     ED Prescriptions    None     PDMP not reviewed this encounter.   Myra Rude, MD 10/23/19 1900

## 2019-10-23 NOTE — ED Triage Notes (Signed)
Pt presents with complaints of pain in her left knee. Reports falling 2 days ago and having pain sense. Reports she broke the same knee a year ago falling that required surgery.

## 2020-09-22 ENCOUNTER — Other Ambulatory Visit: Payer: Self-pay

## 2020-09-22 ENCOUNTER — Inpatient Hospital Stay (HOSPITAL_COMMUNITY)
Admission: AD | Admit: 2020-09-22 | Discharge: 2020-09-22 | Disposition: A | Discharge status: Home / Self Care | Payer: Medicaid Other | Attending: Family Medicine | Admitting: Family Medicine

## 2020-09-22 ENCOUNTER — Encounter (HOSPITAL_COMMUNITY): Payer: Self-pay | Admitting: *Deleted

## 2020-09-22 ENCOUNTER — Ambulatory Visit (INDEPENDENT_AMBULATORY_CARE_PROVIDER_SITE_OTHER): Payer: Medicaid Other

## 2020-09-22 VITALS — Ht 63.0 in | Wt 328.3 lb

## 2020-09-22 DIAGNOSIS — N912 Amenorrhea, unspecified: Secondary | ICD-10-CM | POA: Diagnosis not present

## 2020-09-22 DIAGNOSIS — Z3201 Encounter for pregnancy test, result positive: Secondary | ICD-10-CM

## 2020-09-22 LAB — POCT PREGNANCY, URINE: Preg Test, Ur: POSITIVE — AB

## 2020-09-22 NOTE — MAU Provider Note (Signed)
Event Date/Time   First Provider Initiated Contact with Patient 09/22/20 1128      S [name] is a 30 y.o. G1P1001 patient who presents to MAU today with complaint of being 2 weeks late for her period. No abdominal pain or bleeding. No other medical concern.   O BP (!) 142/68 (BP Location: Right Arm)   Pulse (!) 107   Temp 98.9 F (37.2 C) (Oral)   Resp 18   Ht 5\' 3"  (1.6 m)   Wt (!) 150.2 kg   LMP 08/23/2020   SpO2 99%   BMI 58.67 kg/m  Physical Exam Vitals and nursing note reviewed.  Constitutional:      Appearance: Normal appearance.  HENT:     Head: Normocephalic and atraumatic.  Abdominal:     General: Abdomen is flat.     Palpations: Abdomen is soft.  Neurological:     General: No focal deficit present.     Mental Status: She is alert.  Psychiatric:        Mood and Affect: Mood normal.        Behavior: Behavior normal.        Thought Content: Thought content normal.    A Medical screening exam complete 1. Amenorrhea     P Discharge from MAU in stable condition List of options for follow-up given Warning signs for worsening condition that would warrant emergency follow-up discussed Patient may return to MAU as needed   10/23/2020, DO 09/22/2020 11:28 AM

## 2020-09-22 NOTE — Patient Instructions (Signed)

## 2020-09-22 NOTE — Progress Notes (Signed)
Patient was assessed and managed by nursing staff during this encounter. I have reviewed the chart and agree with the documentation and plan. I have also made any necessary editorial changes.  Adda Stokes A Raesha Coonrod, MD 09/22/2020 1:23 PM   

## 2020-09-22 NOTE — Discharge Instructions (Signed)
You can go to [facility] for pregnancy test and pregnancy verification letter

## 2020-09-22 NOTE — MAU Note (Signed)
Period is 2 wks late. +HPT.  Just wanting confirmation, no complaints.

## 2020-09-22 NOTE — Progress Notes (Signed)
Pt here today for UPT. UPT today in office was positive. Pt states had positive home UPT x 1. Pt denies any vaginal bleeding, abd pain or vomiting. Only having slight nausea, but is controlled with eating something.   LMP 08/15/2020 EDD 05/22/2021 [redacted]w[redacted]d  Pt advised to start taking PNV. Pt states will pick up today. Pt advised to make new OB intake and OB appt with front office today. Pt agreeable to plan of care.  Safe medications list given.   Judeth Cornfield, RN

## 2020-10-07 ENCOUNTER — Telehealth (INDEPENDENT_AMBULATORY_CARE_PROVIDER_SITE_OTHER): Payer: Self-pay

## 2020-10-07 DIAGNOSIS — Z136 Encounter for screening for cardiovascular disorders: Secondary | ICD-10-CM

## 2020-10-07 DIAGNOSIS — Q513 Bicornate uterus: Secondary | ICD-10-CM

## 2020-10-07 DIAGNOSIS — O0990 Supervision of high risk pregnancy, unspecified, unspecified trimester: Secondary | ICD-10-CM | POA: Insufficient documentation

## 2020-10-07 DIAGNOSIS — O3400 Maternal care for unspecified congenital malformation of uterus, unspecified trimester: Secondary | ICD-10-CM

## 2020-10-07 DIAGNOSIS — Z3A00 Weeks of gestation of pregnancy not specified: Secondary | ICD-10-CM

## 2020-10-07 MED ORDER — BLOOD PRESSURE MONITORING DEVI: 1.0000 | 0 refills | Status: DC

## 2020-10-07 NOTE — Progress Notes (Addendum)
New OB Intake  I connected with  Tonya Walls on 10/07/20 at  1:15 PM EDT by MyChart Video Visit and verified that I am speaking with the correct person using two identifiers. Nurse is located at [facility] and pt is located at HOME.  I discussed the limitations, risks, security and privacy concerns of performing an evaluation and management service by telephone and the availability of in person appointments. I also discussed with the patient that there may be a patient responsible charge related to this service. The patient expressed understanding and agreed to proceed.  I explained I am completing New OB Intake today. We discussed her EDD of 05/22/2021 that is based on LMP of 08/15/2020. Pt is G2/P1. I reviewed her allergies, medications, Medical/Surgical/OB history, and appropriate screenings. I informed her of [facility] services. Based on history, this is a/an  pregnancy complicated by Bicornuate Uterus  .   Patient Active Problem List   Diagnosis Date Noted   Screening breast examination 01/04/2019   Breast lump on right side at 3 o'clock position 01/04/2019   Breast pain, right 01/04/2019   Pregnancy 12/15/2015   Obesity in pregnancy with BMI of 50 , antepartum 11/27/2015   Group B Streptococcus carrier, +RV culture, currently pregnant 11/25/2015   Supervision of high-risk pregnancy 05/28/2015   Bicornuate uterus affecting pregnancy, antepartum 04/15/2015    Concerns addressed today  Delivery Plans:  Plans to deliver at [facility].   MyChart/Babyscripts MyChart access verified. I explained pt will have some visits in office and some virtually. Babyscripts instructions given and order placed. Patient verifies receipt of registration text/e-mail. Account successfully created and app downloaded.  Blood Pressure Cuff  Blood pressure cuff ordered for patient to pick-up from [employer]. Explained after first prenatal appt pt will check weekly and document in Babyscripts.  Weight scale:  Patient    have weight scale. Weight scale ordered for patient to pick up form [facility].   Anatomy US Explained first scheduled [place] will be around 19 weeks. Anatomy US scheduled for 12/26/2020 at 9:45a. Pt notified to arrive at 9:30a.  Labs Discussed Avelina Laine genetic screening with patient. Would like both Panorama and Horizon drawn at new OB visit. Routine prenatal labs needed.  Covid Vaccine Patient has covid vaccine.   Mother/ Baby Dyad Candidate?    If yes, offer as possibility  Informed patient of [facility] website  and placed link in her AVS.   Social Determinants of Health Food Insecurity: Patient denies food insecurity. WIC Referral: Patient is interested in referral to [facility].  Transportation: Patient denies transportation needs. Childcare: Discussed no children allowed at ultrasound appointments. Offered childcare services; patient declines childcare services at this time.   Placed OB Box on problem list and updated  First visit review I reviewed new OB appt with pt. I explained she will have a pelvic exam, ob bloodwork with genetic screening, and PAP smear. Explained pt will be seen by Nolene Bernheim, NP at first visit; encounter routed to appropriate provider. Explained that patient will be seen by pregnancy navigator following visit with provider. [facility] information placed in AVS.   Henrietta Dine, CMA 10/07/2020  1:27 PM     Nolene Bernheim, RN, MSN, NP-BC Nurse Practitioner, [facility] for [employer], [facility] 10/09/2020 6:17 PM

## 2020-11-03 ENCOUNTER — Other Ambulatory Visit: Payer: Self-pay

## 2020-11-03 ENCOUNTER — Other Ambulatory Visit (HOSPITAL_COMMUNITY)
Admission: RE | Admit: 2020-11-03 | Discharge: 2020-11-03 | Disposition: A | Discharge status: Home / Self Care | Payer: Medicaid Other | Source: Ambulatory Visit | Attending: Nurse Practitioner | Admitting: Nurse Practitioner

## 2020-11-03 ENCOUNTER — Encounter: Payer: Medicaid Other | Admitting: Nurse Practitioner

## 2020-11-03 ENCOUNTER — Ambulatory Visit (INDEPENDENT_AMBULATORY_CARE_PROVIDER_SITE_OTHER): Payer: Medicaid Other | Admitting: Obstetrics & Gynecology

## 2020-11-03 VITALS — BP 128/69 | HR 102 | Wt 321.0 lb

## 2020-11-03 DIAGNOSIS — O0991 Supervision of high risk pregnancy, unspecified, first trimester: Secondary | ICD-10-CM

## 2020-11-03 DIAGNOSIS — O99210 Obesity complicating pregnancy, unspecified trimester: Secondary | ICD-10-CM

## 2020-11-03 LAB — POCT URINALYSIS DIP (DEVICE)
Bilirubin Urine: NEGATIVE
Glucose, UA: NEGATIVE mg/dL
Hgb urine dipstick: NEGATIVE
Ketones, ur: NEGATIVE mg/dL
Leukocytes,Ua: NEGATIVE
Nitrite: NEGATIVE
Protein, ur: NEGATIVE mg/dL
Specific Gravity, Urine: 1.025 (ref 1.005–1.030)
Urobilinogen, UA: 1 mg/dL (ref 0.0–1.0)
pH: 7 (ref 5.0–8.0)

## 2020-11-03 MED ORDER — BLOOD PRESSURE KIT: 1.0000 | PACK | Freq: Once | 0 refills | Status: AC

## 2020-11-03 NOTE — Progress Notes (Signed)
  Subjective:Sure LMP    Tonya Walls is a G2P1001 [redacted]w[redacted]d being seen today for her first obstetrical visit.  Her obstetrical history is significant for obesity and bicornuate uterus . Patient does intend to breast feed. Pregnancy history fully reviewed.  Patient reports no complaints.  Vitals:   11/03/20 0942  BP: 128/69  Pulse: (!) 102  Weight: (!) 321 lb (145.6 kg)    HISTORY: OB History  Gravida Para Term Preterm AB Living  2 1 1  0 0 1  SAB IAB Ectopic Multiple Live Births  0 0 0 0 1    # Outcome Date GA Lbr Len/2nd Weight Sex Delivery Anes PTL Lv  2 Current           1 Term 12/15/15 [redacted]w[redacted]d 11:38 / 02:17 7 lb 10.9 oz (3.484 kg) F Vag-Spont EPI  LIV   Past Medical History:  Diagnosis Date   Avulsion fracture of tibial tuberosity    acute, left knee   Bicornuate uterus    Obesity, Class III, BMI 40-49.9 (morbid obesity) (HCC)    Wears glasses    Past Surgical History:  Procedure Laterality Date   OPEN REDUCTION INTERNAL FIXATION (ORIF) TIBIAL TUBERCLE Left 03/01/2018   Procedure: OPEN REDUCTION INTERNAL FIXATION (ORIF)LEFT  TIBIAL TUBERCLE;  Surgeon: 03/03/2018, MD;  Location: [facility];  Service: Orthopedics;  Laterality: Left;   WISDOM TOOTH EXTRACTION     Family History  Problem Relation Age of Onset   Healthy Mother    Diabetes Father      Exam    Uterus:     Pelvic Exam:    Perineum: deferred   Vulva:    Vagina:     pH:    Cervix:    Adnexa:    Bony Pelvis:   System: Breast:  normal appearance, no masses or tenderness   Skin: normal coloration and turgor, no rashes    Neurologic: oriented, normal mood   Extremities: normal strength, tone, and muscle mass   HEENT PERRLA and neck supple with midline trachea   Mouth/Teeth mucous membranes moist, pharynx normal without lesions and dental hygiene good   Neck supple   Cardiovascular: regular rate and rhythm, no murmurs or gallops   Respiratory:  appears well, vitals normal, no respiratory distress,  acyanotic, normal RR, neck free of mass or lymphadenopathy, chest clear, no wheezing, crepitations, rhonchi, normal symmetric air entry   Abdomen: obese   Urinary:       Assessment:    Pregnancy: G2P1001 Patient Active Problem List   Diagnosis Date Noted   Supervision of high risk pregnancy, antepartum 10/07/2020   Bicornuate uterus affecting pregnancy, antepartum 04/15/2015        Plan:     Initial labs drawn. Prenatal vitamins. Problem list reviewed and updated. Genetic Screening discussed : ordered.  Ultrasound discussed; fetal survey: ordered.  Follow up in 4 weeks. 50% of 30 min visit spent on counseling and coordination of care.  Panorama and Horizon, A1c   04/17/2015 11/03/2020

## 2020-11-04 LAB — CBC/D/PLT+RPR+RH+ABO+RUBIGG...
Antibody Screen: NEGATIVE
Basophils Absolute: 0 10*3/uL (ref 0.0–0.2)
Basos: 0 %
EOS (ABSOLUTE): 0 10*3/uL (ref 0.0–0.4)
Eos: 1 %
HCV Ab: <0.1 s/co ratio (ref 0.0–0.9)
HIV Screen 4th Generation wRfx: NONREACTIVE
Hematocrit: 30.9 % — ABNORMAL LOW (ref 34.0–46.6)
Hemoglobin: 9.8 g/dL — ABNORMAL LOW (ref 11.1–15.9)
Hepatitis B Surface Ag: NEGATIVE
Immature Grans (Abs): 0 10*3/uL (ref 0.0–0.1)
Immature Granulocytes: 0 %
Lymphocytes Absolute: 1.4 10*3/uL (ref 0.7–3.1)
Lymphs: 22 %
MCH: 24.9 pg — ABNORMAL LOW (ref 26.6–33.0)
MCHC: 31.7 g/dL (ref 31.5–35.7)
MCV: 78 fL — ABNORMAL LOW (ref 79–97)
Monocytes Absolute: 0.5 10*3/uL (ref 0.1–0.9)
Monocytes: 8 %
Neutrophils Absolute: 4.4 10*3/uL (ref 1.4–7.0)
Neutrophils: 69 %
Platelets: 379 10*3/uL (ref 150–450)
RBC: 3.94 x10E6/uL (ref 3.77–5.28)
RDW: 15.7 % — ABNORMAL HIGH (ref 11.7–15.4)
RPR Ser Ql: NONREACTIVE
Rh Factor: POSITIVE
Rubella Antibodies, IGG: 4.99 index (ref >0.99)
WBC: 6.4 10*3/uL (ref 3.4–10.8)

## 2020-11-04 LAB — GC/CHLAMYDIA PROBE AMP (~~LOC~~) NOT AT ARMC
Chlamydia: NEGATIVE
Comment: NEGATIVE
Comment: NORMAL
Neisseria Gonorrhea: NEGATIVE

## 2020-11-04 LAB — HCV INTERPRETATION

## 2020-11-04 LAB — HEMOGLOBIN A1C
Est. average glucose Bld gHb Est-mCnc: 97 mg/dL
Hgb A1c MFr Bld: 5 % (ref 4.8–5.6)

## 2020-11-05 LAB — URINE CULTURE, OB REFLEX

## 2020-11-05 LAB — CULTURE, OB URINE

## 2020-11-10 ENCOUNTER — Encounter: Payer: Self-pay | Admitting: *Deleted

## 2020-11-19 ENCOUNTER — Other Ambulatory Visit: Payer: Self-pay

## 2020-11-19 DIAGNOSIS — O285 Abnormal chromosomal and genetic finding on antenatal screening of mother: Secondary | ICD-10-CM

## 2020-11-19 DIAGNOSIS — O0990 Supervision of high risk pregnancy, unspecified, unspecified trimester: Secondary | ICD-10-CM

## 2020-11-19 NOTE — Progress Notes (Signed)
Patient sent MyChart message inquiring about genetic screening results. Results downloaded from [facility] provider portal and reviewed with Crissie Reese, MD who states pt will need [place] to confirm dating and whether or not this is a multiple gestation. Results will be routed to Scheryl Darter, MD who saw pt for new OB appt. [place] scheduled with MFM for 11/27/20 at 1315.  Fleet Contras RN 11/19/20

## 2020-11-27 ENCOUNTER — Ambulatory Visit: Payer: Medicaid Other

## 2020-11-30 ENCOUNTER — Encounter: Payer: Self-pay | Admitting: Student

## 2020-11-30 DIAGNOSIS — O285 Abnormal chromosomal and genetic finding on antenatal screening of mother: Secondary | ICD-10-CM | POA: Insufficient documentation

## 2020-11-30 DIAGNOSIS — D649 Anemia, unspecified: Secondary | ICD-10-CM | POA: Insufficient documentation

## 2020-11-30 DIAGNOSIS — O99013 Anemia complicating pregnancy, third trimester: Secondary | ICD-10-CM | POA: Insufficient documentation

## 2020-12-01 ENCOUNTER — Ambulatory Visit (INDEPENDENT_AMBULATORY_CARE_PROVIDER_SITE_OTHER): Payer: Self-pay | Admitting: Student

## 2020-12-01 ENCOUNTER — Other Ambulatory Visit: Payer: Self-pay

## 2020-12-01 ENCOUNTER — Encounter: Payer: Self-pay | Admitting: General Practice

## 2020-12-01 VITALS — BP 122/78 | HR 117 | Wt 321.8 lb

## 2020-12-01 DIAGNOSIS — Z3A15 15 weeks gestation of pregnancy: Secondary | ICD-10-CM

## 2020-12-01 DIAGNOSIS — O0990 Supervision of high risk pregnancy, unspecified, unspecified trimester: Secondary | ICD-10-CM

## 2020-12-01 DIAGNOSIS — D649 Anemia, unspecified: Secondary | ICD-10-CM

## 2020-12-01 DIAGNOSIS — O285 Abnormal chromosomal and genetic finding on antenatal screening of mother: Secondary | ICD-10-CM

## 2020-12-01 DIAGNOSIS — O219 Vomiting of pregnancy, unspecified: Secondary | ICD-10-CM

## 2020-12-01 DIAGNOSIS — O99212 Obesity complicating pregnancy, second trimester: Secondary | ICD-10-CM

## 2020-12-01 MED ORDER — METOCLOPRAMIDE HCL 10 MG PO TABS
10.0000 mg | ORAL_TABLET | Freq: Three times a day (TID) | ORAL | 1 refills | Status: DC | PRN
Start: 2020-12-01 — End: 2021-04-11

## 2020-12-01 MED ORDER — POLYSACCHARIDE IRON COMPLEX 150 MG PO CAPS: 150.0000 mg | ORAL_CAPSULE | ORAL | 3 refills | Status: DC

## 2020-12-01 MED ORDER — ASPIRIN EC 81 MG PO TBEC: 81.0000 mg | DELAYED_RELEASE_TABLET | Freq: Every day | ORAL | 2 refills | Status: DC

## 2020-12-01 NOTE — Progress Notes (Signed)
Patient reports pain in sides of abdomen. She compares them to "cramps"

## 2020-12-01 NOTE — Progress Notes (Signed)
PRENATAL VISIT NOTE  Subjective:  Tonya Walls is a 30 y.o. G2P1001 at [redacted]w[redacted]d being seen today for ongoing prenatal care.  She is currently monitored for the following issues for this high-risk pregnancy and has Bicornuate uterus affecting pregnancy, antepartum; Obesity affecting pregnancy in second trimester; Supervision of high risk pregnancy, antepartum; Abnormal chromosomal and genetic finding on antenatal screening mother; and Anemia on their problem list.  Patient reports continued nausea & daily abdominal cramping.  Patient reports she continues to have some nausea & makes it difficult to eat. Worse with pasta & meat. Has been taking pepcid as needed. No antiemetic.  Also reports some intermittent cramping & bilateral side pain. Pain does occur daily & is mild. Denies dysuria, vaginal bleeding, or abnormal discharge. States it's not bad & she feels like it might be normal for pregnancy although she doesn't remember feeling this way with her last pregnancy.    Contractions: Not present. Vag. Bleeding: None.  Movement: Present. Denies leaking of fluid.   The following portions of the patient's history were reviewed and updated as appropriate: allergies, current medications, past family history, past medical history, past social history, past surgical history and problem list.   Objective:   Vitals:   12/01/20 0840  BP: 122/78  Pulse: (!) 117  Weight: (!) 321 lb 12.8 oz (146 kg)    Fetal Status: Fetal Heart Rate (bpm): 154   Movement: Present     General:  Alert, oriented and cooperative. Patient is in no acute distress.  Skin: Skin is warm and dry. No rash noted.   Cardiovascular: Normal heart rate noted  Respiratory: Normal respiratory effort, no problems with respiration noted  Abdomen: Soft, gravid, appropriate for gestational age.  Pain/Pressure: Present     Pelvic: Cervical exam deferred        Extremities: Normal range of motion.  Edema: None  Mental Status: Normal mood  and affect. Normal behavior. Normal judgment and thought content.   Assessment and Plan:  Pregnancy: G2P1001 at [redacted]w[redacted]d 1. Supervision of high risk pregnancy, antepartum -reviewed previous labs & upcoming appointments  2. Nausea and vomiting during pregnancy prior to [redacted] weeks gestation -start taking pepcid daily instead of prn - metoCLOPramide (REGLAN) 10 MG tablet; Take 1 tablet (10 mg total) by mouth every 8 (eight) hours as needed for nausea.  Dispense: 30 tablet; Refill: 1  3. Anemia, unspecified type -patient reports known preexisting anemia that is currently not being treated. Is agreeable to start daily iron. Discussed that is possible she will need IV iron infusion later in the pregnancy.  - iron polysaccharides (NIFEREX) 150 MG capsule; Take 1 capsule (150 mg total) by mouth every other day.  Dispense: 30 capsule; Refill: 3  4. Obesity affecting pregnancy in second trimester  - aspirin EC 81 MG tablet; Take 1 tablet (81 mg total) by mouth daily. Take after 12 weeks for prevention of preeclampsia later in pregnancy  Dispense: 300 tablet; Refill: 2  5. Abnormal chromosomal and genetic finding on antenatal screening mother -HR natera related to vanishing twin vs unknown twin gestation vs triploidy. She is scheduled for an ultrasound with MFM tomorrow. FHT were present via doppler at today's visit. She has not had an ultrasound with this pregnancy. Discussed results with patient & answered questions. Discussed that we would get more information after her ultrasound & she would have a discussion with MFM about results, plan, further testing, etc. Will defer AFP testing until she has appointment with MFM.  6. [redacted] weeks gestation of pregnancy   Preterm labor symptoms and general obstetric precautions including but not limited to vaginal bleeding, contractions, leaking of fluid and fetal movement were reviewed in detail with the patient. Please refer to After Visit Summary for other  counseling recommendations.   Return in about 4 weeks (around 12/29/2020) for Routine OB.  Future Appointments  Date Time Provider Department Center  12/02/2020  8:30 AM [facility] NURSE [facility]  12/02/2020  8:45 AM [facility] US6 [facility]  12/26/2020  9:30 AM [facility] NURSE [facility]  12/26/2020  9:45 AM [facility] US4 [facility]  12/29/2020  8:35 AM Warden Fillers, MD [facility]    Judeth Horn, NP

## 2020-12-02 ENCOUNTER — Ambulatory Visit: Payer: Medicaid Other | Admitting: *Deleted

## 2020-12-02 ENCOUNTER — Other Ambulatory Visit: Payer: Self-pay | Admitting: Obstetrics & Gynecology

## 2020-12-02 ENCOUNTER — Encounter: Payer: Self-pay | Admitting: *Deleted

## 2020-12-02 ENCOUNTER — Other Ambulatory Visit: Payer: Self-pay | Admitting: *Deleted

## 2020-12-02 ENCOUNTER — Ambulatory Visit: Payer: Medicaid Other | Attending: Obstetrics & Gynecology

## 2020-12-02 ENCOUNTER — Ambulatory Visit (HOSPITAL_BASED_OUTPATIENT_CLINIC_OR_DEPARTMENT_OTHER): Payer: Medicaid Other | Admitting: Obstetrics

## 2020-12-02 VITALS — BP 122/71 | HR 106

## 2020-12-02 DIAGNOSIS — O0990 Supervision of high risk pregnancy, unspecified, unspecified trimester: Secondary | ICD-10-CM

## 2020-12-02 DIAGNOSIS — Z3A15 15 weeks gestation of pregnancy: Secondary | ICD-10-CM | POA: Diagnosis not present

## 2020-12-02 DIAGNOSIS — O99212 Obesity complicating pregnancy, second trimester: Secondary | ICD-10-CM | POA: Insufficient documentation

## 2020-12-02 DIAGNOSIS — O30042 Twin pregnancy, dichorionic/diamniotic, second trimester: Secondary | ICD-10-CM

## 2020-12-02 DIAGNOSIS — O285 Abnormal chromosomal and genetic finding on antenatal screening of mother: Secondary | ICD-10-CM

## 2020-12-02 DIAGNOSIS — O9912 Other diseases of the blood and blood-forming organs and certain disorders involving the immune mechanism complicating childbirth: Secondary | ICD-10-CM

## 2020-12-02 DIAGNOSIS — E669 Obesity, unspecified: Secondary | ICD-10-CM

## 2020-12-02 DIAGNOSIS — Z6841 Body Mass Index (BMI) 40.0 and over, adult: Secondary | ICD-10-CM

## 2020-12-02 DIAGNOSIS — O99012 Anemia complicating pregnancy, second trimester: Secondary | ICD-10-CM

## 2020-12-02 NOTE — Progress Notes (Signed)
MFM Note  Tonya Walls was seen for an ultrasound today as her cell free DNA test showed an increased risk of triploidy or an undiagnosed twin gestation.  The results for trisomy 21, 18, and 13 could not be reported.  She denies any problems in her current pregnancy.  Her pregnancy has been complicated due to maternal obesity with a BMI of 57 and a bicornuate uterus.  The patient reports that she is a fraternal twin herself.  This is the first ultrasound exam that she has had in her current pregnancy.  She reports one prior normal uncomplicated full-term vaginal delivery.  On today's exam, a twin gestation is noted.  The fetal biometry measurements for both fetuses measured appropriate for her gestational age, confirming her [facility] of May 22, 2021.  A thick dividing membrane was noted separating the two fetuses, indicating that these are dichorionic, diamniotic twins.  The patient was advised that her cell free DNA test may have showed abnormal results due to the twin gestation that was not reported to the lab.  We will ask the lab [name] to recalculate the results now that we know that she has a dichorionic twin gestation.  Our genetic counselor will notify her regarding the results of the cell free DNA test.  The management of dichorionic twins was discussed.  She was advised that management of twin pregnancies will involve frequent ultrasound exams to assess the fetal growth and amniotic fluid level.  A detailed fetal anatomy scan has already been scheduled for her at around 19 weeks.  We will continue to follow her with serial growth ultrasounds following her fetal anatomy scan.  Weekly fetal testing for dichorionic twins should start at around 36 weeks.  Delivery for uncomplicated dichorionic twins should occur at around 38 weeks.  The increased risk of preeclampsia, gestational diabetes, and preterm birth/labor associated with twin pregnancies was discussed.  She was advised that she will  continue to be followed closely to assess for these conditions. As pregnancies with multiple gestations are at increased risk for developing preeclampsia, she was advised to continue taking a daily baby aspirin (81 mg per day) to decrease her risk of developing preeclampsia.   She will return to our office next month for a detailed fetal anatomy scan.  The patient stated that all of her questions had been answered.  A total of 30 minutes was spent counseling and coordinating the care for this patient.  Greater than 50% of the time was spent in direct face-to-face contact.

## 2020-12-04 ENCOUNTER — Encounter: Payer: Self-pay | Admitting: General Practice

## 2020-12-08 ENCOUNTER — Telehealth: Payer: Self-pay | Admitting: Genetics

## 2020-12-08 ENCOUNTER — Other Ambulatory Visit: Payer: Self-pay

## 2020-12-08 NOTE — Telephone Encounter (Signed)
Tonya Walls was contacted by telephone on 12/08/20 to review their recalculated Non-Invasive Prenatal Screening (NIPS) result. The result is low risk, consistent with a dizygotic female fetuses. This screening significantly reduces the risk that the current pregnancy is affected Down syndrome, Trisomy 18, and Trisomy 13. A low-risk NIPS result does not ensure an unaffected pregnancy. All questions were answered.

## 2020-12-26 ENCOUNTER — Ambulatory Visit: Payer: Medicaid Other | Admitting: *Deleted

## 2020-12-26 ENCOUNTER — Encounter: Payer: Self-pay | Admitting: *Deleted

## 2020-12-26 ENCOUNTER — Other Ambulatory Visit: Payer: Self-pay

## 2020-12-26 ENCOUNTER — Ambulatory Visit: Payer: Medicaid Other | Attending: Nurse Practitioner

## 2020-12-26 VITALS — BP 143/72 | HR 120

## 2020-12-26 DIAGNOSIS — O99012 Anemia complicating pregnancy, second trimester: Secondary | ICD-10-CM | POA: Diagnosis not present

## 2020-12-26 DIAGNOSIS — O0992 Supervision of high risk pregnancy, unspecified, second trimester: Secondary | ICD-10-CM | POA: Diagnosis not present

## 2020-12-26 DIAGNOSIS — O0990 Supervision of high risk pregnancy, unspecified, unspecified trimester: Secondary | ICD-10-CM | POA: Diagnosis not present

## 2020-12-26 DIAGNOSIS — O30042 Twin pregnancy, dichorionic/diamniotic, second trimester: Secondary | ICD-10-CM | POA: Diagnosis not present

## 2020-12-26 DIAGNOSIS — O285 Abnormal chromosomal and genetic finding on antenatal screening of mother: Secondary | ICD-10-CM | POA: Insufficient documentation

## 2020-12-26 DIAGNOSIS — Z3689 Encounter for other specified antenatal screening: Secondary | ICD-10-CM

## 2020-12-26 DIAGNOSIS — Z3A19 19 weeks gestation of pregnancy: Secondary | ICD-10-CM

## 2020-12-26 DIAGNOSIS — E669 Obesity, unspecified: Secondary | ICD-10-CM

## 2020-12-27 ENCOUNTER — Encounter: Payer: Self-pay | Admitting: Nurse Practitioner

## 2020-12-27 DIAGNOSIS — O30049 Twin pregnancy, dichorionic/diamniotic, unspecified trimester: Secondary | ICD-10-CM | POA: Insufficient documentation

## 2020-12-27 DIAGNOSIS — Z8759 Personal history of other complications of pregnancy, childbirth and the puerperium: Secondary | ICD-10-CM | POA: Insufficient documentation

## 2020-12-27 DIAGNOSIS — Z6841 Body Mass Index (BMI) 40.0 and over, adult: Secondary | ICD-10-CM | POA: Insufficient documentation

## 2020-12-27 HISTORY — DX: Personal history of other complications of pregnancy, childbirth and the puerperium: Z87.59

## 2020-12-29 ENCOUNTER — Other Ambulatory Visit: Payer: Self-pay | Admitting: *Deleted

## 2020-12-29 ENCOUNTER — Encounter: Payer: Medicaid Other | Admitting: Obstetrics and Gynecology

## 2020-12-29 DIAGNOSIS — Z362 Encounter for other antenatal screening follow-up: Secondary | ICD-10-CM

## 2020-12-29 DIAGNOSIS — O30042 Twin pregnancy, dichorionic/diamniotic, second trimester: Secondary | ICD-10-CM

## 2021-01-12 ENCOUNTER — Ambulatory Visit (INDEPENDENT_AMBULATORY_CARE_PROVIDER_SITE_OTHER): Payer: Self-pay | Admitting: Obstetrics and Gynecology

## 2021-01-12 ENCOUNTER — Encounter: Payer: Self-pay | Admitting: Obstetrics and Gynecology

## 2021-01-12 ENCOUNTER — Other Ambulatory Visit: Payer: Self-pay

## 2021-01-12 VITALS — BP 129/75 | HR 124 | Wt 321.5 lb

## 2021-01-12 DIAGNOSIS — O285 Abnormal chromosomal and genetic finding on antenatal screening of mother: Secondary | ICD-10-CM

## 2021-01-12 DIAGNOSIS — O30049 Twin pregnancy, dichorionic/diamniotic, unspecified trimester: Secondary | ICD-10-CM

## 2021-01-12 DIAGNOSIS — O3400 Maternal care for unspecified congenital malformation of uterus, unspecified trimester: Secondary | ICD-10-CM

## 2021-01-12 DIAGNOSIS — O0990 Supervision of high risk pregnancy, unspecified, unspecified trimester: Secondary | ICD-10-CM

## 2021-01-12 DIAGNOSIS — Q513 Bicornate uterus: Secondary | ICD-10-CM

## 2021-01-12 NOTE — Patient Instructions (Signed)

## 2021-01-12 NOTE — Progress Notes (Signed)
Subjective:  Tonya Walls is a 30 y.o. G2P1001 at [redacted]w[redacted]d being seen today for ongoing prenatal care.  She is currently monitored for the following issues for this high-risk pregnancy and has Bicornuate uterus affecting pregnancy, antepartum; Obesity affecting pregnancy in second trimester; Supervision of high risk pregnancy, antepartum; Abnormal chromosomal and genetic finding on antenatal screening mother; Anemia; BMI 50.0-59.9, adult (HCC); and Dizygotic twin pregnancy on their problem list.  Patient reports general discomforts of pregnancy.  Contractions: Not present. Vag. Bleeding: None.  Movement: Present. Denies leaking of fluid.   The following portions of the patient's history were reviewed and updated as appropriate: allergies, current medications, past family history, past medical history, past social history, past surgical history and problem list. Problem list updated.  Objective:   Vitals:   01/12/21 1339  BP: 129/75  Pulse: (!) 124  Weight: (!) 321 lb 8 oz (145.8 kg)    Fetal Status: Fetal Heart Rate (bpm): 152/162   Movement: Present     General:  Alert, oriented and cooperative. Patient is in no acute distress.  Skin: Skin is warm and dry. No rash noted.   Cardiovascular: Normal heart rate noted  Respiratory: Normal respiratory effort, no problems with respiration noted  Abdomen: Soft, gravid, appropriate for gestational age. Pain/Pressure: Present     Pelvic:  Cervical exam deferred        Extremities: Normal range of motion.  Edema: None  Mental Status: Normal mood and affect. Normal behavior. Normal judgment and thought content.   Urinalysis:      Assessment and Plan:  Pregnancy: G2P1001 at [redacted]w[redacted]d  1. Supervision of high risk pregnancy, antepartum Stable F/U growth scan next month Declined flu vaccine 2. Dizygotic twin pregnancy Stable Serial growth scans as per MFM  3. Bicornuate uterus affecting pregnancy, antepartum Stable  4. Abnormal chromosomal and  genetic finding on antenatal screening mother Stable AFP today  Preterm labor symptoms and general obstetric precautions including but not limited to vaginal bleeding, contractions, leaking of fluid and fetal movement were reviewed in detail with the patient. Please refer to After Visit Summary for other counseling recommendations.  Return in about 4 weeks (around 02/09/2021) for OB visit, face to face, MD only, fasting Glucola.   Hermina Staggers, MD

## 2021-01-14 LAB — AFP, SERUM, OPEN SPINA BIFIDA
AFP MoM: 2.83
AFP Value: 142.5 ng/mL
Gest. Age on Collection Date: 21.3 weeks
Maternal Age At EDD: 28.8 yr
OSBR Risk 1 IN: 913
Test Results:: NEGATIVE
Weight: 322 [lb_av]

## 2021-01-26 ENCOUNTER — Ambulatory Visit: Payer: Medicaid Other | Admitting: *Deleted

## 2021-01-26 ENCOUNTER — Other Ambulatory Visit: Payer: Self-pay | Admitting: *Deleted

## 2021-01-26 ENCOUNTER — Other Ambulatory Visit: Payer: Self-pay

## 2021-01-26 ENCOUNTER — Ambulatory Visit: Payer: Medicaid Other | Attending: Obstetrics

## 2021-01-26 ENCOUNTER — Encounter: Payer: Self-pay | Admitting: *Deleted

## 2021-01-26 VITALS — BP 123/75 | HR 118

## 2021-01-26 DIAGNOSIS — O0990 Supervision of high risk pregnancy, unspecified, unspecified trimester: Secondary | ICD-10-CM

## 2021-01-26 DIAGNOSIS — Z362 Encounter for other antenatal screening follow-up: Secondary | ICD-10-CM | POA: Insufficient documentation

## 2021-01-26 DIAGNOSIS — O30042 Twin pregnancy, dichorionic/diamniotic, second trimester: Secondary | ICD-10-CM

## 2021-01-26 DIAGNOSIS — O285 Abnormal chromosomal and genetic finding on antenatal screening of mother: Secondary | ICD-10-CM | POA: Diagnosis present

## 2021-01-26 DIAGNOSIS — O34593 Maternal care for other abnormalities of gravid uterus, third trimester: Secondary | ICD-10-CM | POA: Diagnosis not present

## 2021-01-26 DIAGNOSIS — Z3A23 23 weeks gestation of pregnancy: Secondary | ICD-10-CM

## 2021-01-26 DIAGNOSIS — Z6841 Body Mass Index (BMI) 40.0 and over, adult: Secondary | ICD-10-CM

## 2021-02-09 ENCOUNTER — Other Ambulatory Visit: Payer: Self-pay | Admitting: *Deleted

## 2021-02-09 DIAGNOSIS — O0990 Supervision of high risk pregnancy, unspecified, unspecified trimester: Secondary | ICD-10-CM

## 2021-02-10 ENCOUNTER — Encounter: Payer: Medicaid Other | Admitting: Obstetrics and Gynecology

## 2021-02-10 ENCOUNTER — Other Ambulatory Visit: Payer: Medicaid Other

## 2021-02-12 ENCOUNTER — Other Ambulatory Visit: Payer: Medicaid Other

## 2021-02-12 ENCOUNTER — Encounter: Payer: Self-pay | Admitting: Obstetrics & Gynecology

## 2021-02-12 ENCOUNTER — Other Ambulatory Visit: Payer: Self-pay

## 2021-02-12 ENCOUNTER — Ambulatory Visit (INDEPENDENT_AMBULATORY_CARE_PROVIDER_SITE_OTHER): Payer: Medicaid Other | Admitting: Obstetrics & Gynecology

## 2021-02-12 VITALS — BP 146/92 | HR 119 | Wt 326.0 lb

## 2021-02-12 DIAGNOSIS — Z3A25 25 weeks gestation of pregnancy: Secondary | ICD-10-CM

## 2021-02-12 DIAGNOSIS — O3400 Maternal care for unspecified congenital malformation of uterus, unspecified trimester: Secondary | ICD-10-CM

## 2021-02-12 DIAGNOSIS — O0990 Supervision of high risk pregnancy, unspecified, unspecified trimester: Secondary | ICD-10-CM

## 2021-02-12 DIAGNOSIS — O162 Unspecified maternal hypertension, second trimester: Secondary | ICD-10-CM | POA: Insufficient documentation

## 2021-02-12 DIAGNOSIS — Q513 Bicornate uterus: Secondary | ICD-10-CM

## 2021-02-12 DIAGNOSIS — O99210 Obesity complicating pregnancy, unspecified trimester: Secondary | ICD-10-CM

## 2021-02-12 DIAGNOSIS — O30049 Twin pregnancy, dichorionic/diamniotic, unspecified trimester: Secondary | ICD-10-CM

## 2021-02-12 LAB — COMPREHENSIVE METABOLIC PANEL
ALT: 12 IU/L (ref 0–32)
AST: 15 IU/L (ref 0–40)
Albumin/Globulin Ratio: 1.2 (ref 1.2–2.2)
Albumin: 3.6 g/dL — ABNORMAL LOW (ref 3.9–5.0)
Alkaline Phosphatase: 105 IU/L (ref 44–121)
BUN/Creatinine Ratio: 15 (ref 9–23)
BUN: 7 mg/dL (ref 6–20)
Bilirubin Total: 0.3 mg/dL (ref 0.0–1.2)
CO2: 17 mmol/L — ABNORMAL LOW (ref 20–29)
Calcium: 8.7 mg/dL (ref 8.7–10.2)
Chloride: 103 mmol/L (ref 96–106)
Creatinine, Ser: 0.47 mg/dL — ABNORMAL LOW (ref 0.57–1.00)
Globulin, Total: 2.9 g/dL (ref 1.5–4.5)
Glucose: 148 mg/dL — ABNORMAL HIGH (ref 70–99)
Potassium: 3.8 mmol/L (ref 3.5–5.2)
Sodium: 135 mmol/L (ref 134–144)
Total Protein: 6.5 g/dL (ref 6.0–8.5)
eGFR: 133 mL/min/{1.73_m2} (ref >59)

## 2021-02-12 NOTE — Patient Instructions (Signed)
TDaP Vaccine Pregnancy Get the Whooping Cough Vaccine While You Are Pregnant (CDC)  It is important for women to get the whooping cough vaccine in the third trimester of each pregnancy. Vaccines are the best way to prevent this disease. There are 2 different whooping cough vaccines. Both vaccines combine protection against whooping cough, tetanus and diphtheria, but they are for different age groups: Tdap: for everyone 11 years or older, including pregnant women  DTaP: for children 2 months through 6 years of age  You need the whooping cough vaccine during each of your pregnancies The recommended time to get the shot is during your 27th through 36th week of pregnancy, preferably during the earlier part of this time period. The Centers for Disease Control and Prevention (CDC) recommends that pregnant women receive the whooping cough vaccine for adolescents and adults (called Tdap vaccine) during the third trimester of each pregnancy. The recommended time to get the shot is during your 27th through 36th week of pregnancy, preferably during the earlier part of this time period. This replaces the original recommendation that pregnant women get the vaccine only if they had not previously received it. The American College of Obstetricians and Gynecologists and the American College of Nurse-Midwives support this recommendation.  You should get the whooping cough vaccine while pregnant to pass protection to your baby frame support disabled and/or not supported in this browser  Learn why Tonya Walls decided to get the whooping cough vaccine in her 3rd trimester of pregnancy and how her baby girl was born with some protection against the disease. Also available on YouTube. After receiving the whooping cough vaccine, your body will create protective antibodies (proteins produced by the body to fight off diseases) and pass some of them to your baby before birth. These antibodies provide your baby some short-term  protection against whooping cough in early life. These antibodies can also protect your baby from some of the more serious complications that come along with whooping cough. Your protective antibodies are at their highest about 2 weeks after getting the vaccine, but it takes time to pass them to your baby. So the preferred time to get the whooping cough vaccine is early in your third trimester. The amount of whooping cough antibodies in your body decreases over time. That is why CDC recommends you get a whooping cough vaccine during each pregnancy. Doing so allows each of your babies to get the greatest number of protective antibodies from you. This means each of your babies will get the best protection possible against this disease.  Getting the whooping cough vaccine while pregnant is better than getting the vaccine after you give birth Whooping cough vaccination during pregnancy is ideal so your baby will have short-term protection as soon as he is born. This early protection is important because your baby will not start getting his whooping cough vaccines until he is 2 months old. These first few months of life are when your baby is at greatest risk for catching whooping cough. This is also when he's at greatest risk for having severe, potentially life-threating complications from the infection. To avoid that gap in protection, it is best to get a whooping cough vaccine during pregnancy. You will then pass protection to your baby before he is born. To continue protecting your baby, he should get whooping cough vaccines starting at 2 months old. You may never have gotten the Tdap vaccine before and did not get it during this pregnancy. If so, you should make sure   to get the vaccine immediately after you give birth, before leaving the hospital or birthing center. It will take about 2 weeks before your body develops protection (antibodies) in response to the vaccine. Once you have protection from the vaccine,  you are less likely to give whooping cough to your newborn while caring for him. But remember, your baby will still be at risk for catching whooping cough from others. A recent study looked to see how effective Tdap was at preventing whooping cough in babies whose mothers got the vaccine while pregnant or in the hospital after giving birth. The study found that getting Tdap between 27 through 36 weeks of pregnancy is 85% more effective at preventing whooping cough in babies younger than 2 months old. Blood tests cannot tell if you need a whooping cough vaccine There are no blood tests that can tell you if you have enough antibodies in your body to protect yourself or your baby against whooping cough. Even if you have been sick with whooping cough in the past or previously received the vaccine, you still should get the vaccine during each pregnancy. Breastfeeding may pass some protective antibodies onto your baby By breastfeeding, you may pass some antibodies you have made in response to the vaccine to your baby. When you get a whooping cough vaccine during your pregnancy, you will have antibodies in your breast milk that you can share with your baby as soon as your milk comes in. However, your baby will not get protective antibodies immediately if you wait to get the whooping cough vaccine until after delivering your baby. This is because it takes about 2 weeks for your body to create antibodies. Learn more about the health benefits of breastfeeding.  

## 2021-02-12 NOTE — Progress Notes (Signed)
PRENATAL VISIT NOTE  Subjective:  Tonya Walls is a 30 y.o. G2P1001 at [redacted]w[redacted]d being seen today for ongoing prenatal care.  She is currently monitored for the following issues for this high-risk pregnancy and has Bicornuate uterus affecting pregnancy, antepartum; Maternal morbid obesity, antepartum ([place]); Supervision of high risk pregnancy, antepartum; Anemia; BMI 50.0-59.9, adult ([place]); Dizygotic twin pregnancy; and Hypertension affecting pregnancy in second trimester on their problem list.  Patient reports no complaints. Patient denies any headaches, visual symptoms, RUQ/epigastric pain or other concerning symptoms.  Contractions: Not present. Vag. Bleeding: None.  Movement: Present. Denies leaking of fluid.   The following portions of the patient's history were reviewed and updated as appropriate: allergies, current medications, past family history, past medical history, past social history, past surgical history and problem list.   Objective:   Vitals:   02/12/21 0833  BP: (!) 146/92  Pulse: (!) 119  Weight: (!) 326 lb (147.9 kg)    Fetal Status: Fetal Heart Rate (bpm): 152/166   Movement: Present     General:  Alert, oriented and cooperative. Patient is in no acute distress.  Skin: Skin is warm and dry. No rash noted.   Cardiovascular: Normal heart rate noted  Respiratory: Normal respiratory effort, no problems with respiration noted  Abdomen: Soft, gravid, appropriate for gestational age.  Pain/Pressure: Present     Pelvic: Cervical exam deferred        Extremities: Normal range of motion.  Edema: None  Mental Status: Normal mood and affect. Normal behavior. Normal judgment and thought content.   Imaging: [place] MFM OB FOLLOW UP  Result Date: 01/26/2021 ----------------------------------------------------------------------  OBSTETRICS REPORT                       (Signed Final 01/26/2021 10:53 am) ---------------------------------------------------------------------- Patient  Info  ID #:       [id number]                          D.O.B.:  September 26, 1990 (30 yrs)  Name:       Tonya Walls                  Visit Date: 01/26/2021 09:17 am ---------------------------------------------------------------------- Performed By  Attending:        Johnell Comings MD         Secondary Phy.:   Woodroe Mode MD  Performed By:     Nathen May       Address:          [address]  [place], [place]  Referred By:      [facility]          Location:         [facility]                                F[facility] Care at                                                             [facility]                                                             Women  Ref. Address:     [address]                    [place], [place] ---------------------------------------------------------------------- Orders  #  Description                           Code        Ordered By  1  [place] MFM OB FOLLOW UP                   [id number]    YU FANG  2  [place] MFM OB FOLLOW UP ADDL              [id number]    Peterson Ao     GEST ----------------------------------------------------------------------  #  Order #                     Accession #                Episode #  1  [id number]                   [id number]                 [id number]  2  [id number]                   [id number]                 [id number] ---------------------------------------------------------------------- Indications  Twin pregnancy, di/di, second trimester        123456  Obesity complicating pregnancy, second         O99.212  trimester (BMI 55)  [redacted] weeks gestation of pregnancy  Z3A.23  Uterine abnormality during pregnancy            O34.599  (Bicornuate Uterus)  Anemia during pregnancy in second trimester    O99.012  Low Risk NIPS(Dizygotic Fraternal  Twins)(Negative Horizon) ---------------------------------------------------------------------- Vital Signs                                                 Height:        5'3" ---------------------------------------------------------------------- Fetal Evaluation (Fetus A)  Num Of Fetuses:         2  Fetal Heart Rate(bpm):  136  Cardiac Activity:       Observed  Fetal Lie:              Maternal left side  Presentation:           Breech  Placenta:               Anterior  P. Cord Insertion:      Visualized, central  Membrane Desc:      Dividing Membrane seen - Dichorionic.  Amniotic Fluid  AFI FV:      Within normal limits                              Largest Pocket(cm)                              4.36 ---------------------------------------------------------------------- Biometry (Fetus A)  BPD:      56.3  mm     G. Age:  23w 1d         36  %    CI:         66.5   %    70 - 86                                                          FL/HC:      17.9   %    19.2 - 20.8  HC:      221.3  mm     G. Age:  24w 1d         63  %    HC/AC:      1.07        1.05 - 1.21  AC:      205.9  mm     G. Age:  25w 1d         89  %    FL/BPD:     70.3   %    71 - 87  FL:       39.6  mm     G. Age:  22w 5d         19  %    FL/AC:      19.2   %    20 - 24  HUM:      37.7  mm     G. Age:  23w 2d         39  %  Est. FW:     660  gm  1 lb 7 oz     74  %     FW Discordancy      0 \ 4 % ---------------------------------------------------------------------- OB History  Gravidity:    2         Term:   1        Prem:   0        SAB:   0  TOP:          0       Ectopic:  0        Living: 1 ---------------------------------------------------------------------- Gestational Age (Fetus A)  LMP:           23w 3d        Date:  08/15/20                 EDD:   05/22/21  U/S Today:     23w 6d                                         EDD:   05/19/21  Best:          23w 3d     Det. By:  LMP  (08/15/20)          EDD:   05/22/21 ---------------------------------------------------------------------- Anatomy (Fetus A)  Cranium:               Appears normal         Aortic Arch:            Not well visualized  Cavum:                 Appears normal         Ductal Arch:            Not well visualized  Ventricles:            Previously seen        Diaphragm:              Appears normal  Choroid Plexus:        Previously seen        Stomach:                Appears normal, left                                                                        sided  Cerebellum:            Previously seen        Abdomen:                Previously seen  Posterior Fossa:       Previously seen        Abdominal Wall:         Previously seen  Nuchal Fold:           Previously seen        Cord Vessels:           Previously seen  Face:  Appears normal         Kidneys:                Appear normal                         (orbits and profile)  Lips:                  Not well visualized    Bladder:                Appears normal  Thoracic:              Appears normal         Spine:                  Previously seen  Heart:                 Appears normal         Upper Extremities:      Previously seen                         (4CH, axis, and                         situs)  RVOT:                  Appears normal         Lower Extremities:      Previously seen  LVOT:                  Appears normal  Other:  Fetus previously seen to be female. Heels visualized. Technically          difficult due to maternal habitus and fetal positon. ---------------------------------------------------------------------- Fetal Evaluation (Fetus B)  Num Of Fetuses:         2  Fetal Heart Rate(bpm):  155  Cardiac Activity:       Observed  Fetal Lie:              Maternal right side  Presentation:           Breech  Placenta:               Posterior  P. Cord Insertion:      Previously Visualized   Membrane Desc:      Dividing Membrane seen - Dichorionic.  Amniotic Fluid  AFI FV:      Within normal limits                              Largest Pocket(cm)                              5.91 ---------------------------------------------------------------------- Biometry (Fetus B)  BPD:      57.5  mm     G. Age:  23w 4d         51  %    CI:           69   %    70 - 86  FL/HC:      19.0   %    19.2 - 20.8  HC:      221.1  mm     G. Age:  24w 1d         62  %    HC/AC:      1.15        1.05 - 1.21  AC:      191.9  mm     G. Age:  23w 6d         57  %    FL/BPD:     73.2   %    71 - 87  FL:       42.1  mm     G. Age:  23w 5d         48  %    FL/AC:      21.9   %    20 - 24  HUM:      39.6  mm     G. Age:  24w 1d         58  %  LV:        5.6  mm  Est. FW:     635  gm      1 lb 6 oz     63  %     FW Discordancy         4  % ---------------------------------------------------------------------- Gestational Age (Fetus B)  LMP:           23w 3d        Date:  08/15/20                 EDD:   05/22/21  U/S Today:     23w 6d                                        EDD:   05/19/21  Best:          23w 3d     Det. By:  LMP  (08/15/20)          EDD:   05/22/21 ---------------------------------------------------------------------- Anatomy (Fetus B)  Cranium:               Appears normal         Aortic Arch:            Not well visualized  Cavum:                 Appears normal         Ductal Arch:            Not well visualized  Ventricles:            Previously seen        Diaphragm:              Appears normal  Choroid Plexus:        Previously seen        Stomach:                Appears normal, left  sided  Cerebellum:            Previously seen        Abdomen:                Previously seen  Posterior Fossa:       Previously seen        Abdominal Wall:         Previously seen  Nuchal Fold:            Previously seen        Cord Vessels:           Previously seen  Face:                  Orbits and profile     Kidneys:                Appear normal                         previously seen  Lips:                  Previously seen        Bladder:                Appears normal  Thoracic:              Appears normal         Spine:                  Previously seen  Heart:                 Previously seen        Upper Extremities:      Previously seen  RVOT:                  Not well visualized    Lower Extremities:      Previously seen  LVOT:                  Not well visualized  Other:  Female gender previously seen. Technically difficult due to maternal          habitus and fetal position. ---------------------------------------------------------------------- Cervix Uterus Adnexa  Cervix  Length:           5.21  cm.  Normal appearance by transabdominal scan.  Uterus  No abnormality visualized. ---------------------------------------------------------------------- Comments  This patient was seen due to a spontaneously conceived  dichorionic twin pregnancy and maternal obesity with a BMI  of 57.  She denies any problems since her last exam.  The fetal growth and amniotic fluid level appeared  appropriate for both twin A and twin B.  The views of the fetal anatomy remain limited today due to  extreme maternal body habitus.  The proximal cardiac views for twin A were visualized today  and appeared within normal limits.  The cardiac views for twin  B were unable to be visualized today.  The patient was advised that we will continue to assess the  fetal anatomy during her future exams.  She understands that  sometimes it may not be possible to clear all the views of the  fetal anatomy during prenatal ultrasounds.  The limitations of ultrasound in the detection of all anomalies  was discussed today.  A follow-up exam was scheduled in 4 weeks to assess the  fetal growth and to try to complete the views of  the fetal  anatomy.  ----------------------------------------------------------------------                   Ma Rings, MD Electronically Signed Final Report   01/26/2021 10:53 am ----------------------------------------------------------------------  [place] MFM OB FOLLOW UP ADDL GEST  Result Date: 01/26/2021 ----------------------------------------------------------------------  OBSTETRICS REPORT                       (Signed Final 01/26/2021 10:53 am) ---------------------------------------------------------------------- Patient Info  ID #:       [id number]                          D.O.B.:  [birth year] (30 yrs)  Name:       Tonya Walls                  Visit Date: 01/26/2021 09:17 am ---------------------------------------------------------------------- Performed By  Attending:        Ma Rings MD         Secondary Phy.:   Adam Phenix MD  Performed By:     Marcellina Millin       Address:          [address]                                                             [place], [place]                                                             [address]  Referred By:      [facility]          Location:         [facility]                                [facility]                                                             [facility]  Ref. Address:     [address]                    [place], [place]                    [address] ---------------------------------------------------------------------- Orders  #  Description                           Code        Ordered By  1  [place] MFM OB FOLLOW UP                   [id number]    YU FANG  2  [place] MFM OB FOLLOW UP ADDL              [id number]    Rosana Hoes     GEST ----------------------------------------------------------------------  #   Order #                     Accession #                Episode #  1  [id number]                   [id number]                 [id number]  2  [id number]                   [id number]                 [id number] ---------------------------------------------------------------------- Indications  Twin pregnancy, di/di, second trimester        O30.042  Obesity complicating pregnancy, second         O99.212  trimester (BMI 57)  [redacted] weeks gestation of pregnancy                Z3A.23  Uterine abnormality during pregnancy           O34.599  (Bicornuate Uterus)  Anemia during pregnancy in second trimester    O99.012  Low Risk NIPS(Dizygotic Fraternal  Twins)(Negative Horizon) ---------------------------------------------------------------------- Vital Signs                                                 Height:        5'3" ---------------------------------------------------------------------- Fetal Evaluation (Fetus A)  Num Of Fetuses:         2  Fetal Heart Rate(bpm):  136  Cardiac Activity:       Observed  Fetal Lie:              Maternal left side  Presentation:           Breech  Placenta:               Anterior  P. Cord Insertion:      Visualized, central  Membrane Desc:  Dividing Membrane seen - Dichorionic.  Amniotic Fluid  AFI FV:      Within normal limits                              Largest Pocket(cm)                              4.36 ---------------------------------------------------------------------- Biometry (Fetus A)  BPD:      56.3  mm     G. Age:  23w 1d         36  %    CI:         66.5   %    70 - 86                                                          FL/HC:      17.9   %    19.2 - 20.8  HC:      221.3  mm     G. Age:  24w 1d         63  %    HC/AC:      1.07        1.05 - 1.21  AC:      205.9  mm     G. Age:  25w 1d         89  %    FL/BPD:     70.3   %    71 - 87  FL:       39.6  mm     G. Age:  22w 5d         19  %    FL/AC:      19.2   %    20 - 24  HUM:      37.7  mm     G. Age:  23w 2d         39  %   Est. FW:     660  gm      1 lb 7 oz     74  %     FW Discordancy      0 \ 4 % ---------------------------------------------------------------------- OB History  Gravidity:    2         Term:   1        Prem:   0        SAB:   0  TOP:          0       Ectopic:  0        Living: 1 ---------------------------------------------------------------------- Gestational Age (Fetus A)  LMP:           23w 3d        Date:  08/15/20                 EDD:   05/22/21  U/S Today:     23w 6d                                        EDD:  05/19/21  Best:          23w 3d     Det. By:  LMP  (08/15/20)          EDD:   05/22/21 ---------------------------------------------------------------------- Anatomy (Fetus A)  Cranium:               Appears normal         Aortic Arch:            Not well visualized  Cavum:                 Appears normal         Ductal Arch:            Not well visualized  Ventricles:            Previously seen        Diaphragm:              Appears normal  Choroid Plexus:        Previously seen        Stomach:                Appears normal, left                                                                        sided  Cerebellum:            Previously seen        Abdomen:                Previously seen  Posterior Fossa:       Previously seen        Abdominal Wall:         Previously seen  Nuchal Fold:           Previously seen        Cord Vessels:           Previously seen  Face:                  Appears normal         Kidneys:                Appear normal                         (orbits and profile)  Lips:                  Not well visualized    Bladder:                Appears normal  Thoracic:              Appears normal         Spine:                  Previously seen  Heart:                 Appears normal         Upper Extremities:      Previously seen                         (  4CH, axis, and                         situs)  RVOT:                  Appears normal         Lower Extremities:      Previously seen   LVOT:                  Appears normal  Other:  Fetus previously seen to be female. Heels visualized. Technically          difficult due to maternal habitus and fetal positon. ---------------------------------------------------------------------- Fetal Evaluation (Fetus B)  Num Of Fetuses:         2  Fetal Heart Rate(bpm):  155  Cardiac Activity:       Observed  Fetal Lie:              Maternal right side  Presentation:           Breech  Placenta:               Posterior  P. Cord Insertion:      Previously Visualized  Membrane Desc:      Dividing Membrane seen - Dichorionic.  Amniotic Fluid  AFI FV:      Within normal limits                              Largest Pocket(cm)                              5.91 ---------------------------------------------------------------------- Biometry (Fetus B)  BPD:      57.5  mm     G. Age:  23w 4d         51  %    CI:           69   %    70 - 86                                                          FL/HC:      19.0   %    19.2 - 20.8  HC:      221.1  mm     G. Age:  24w 1d         62  %    HC/AC:      1.15        1.05 - 1.21  AC:      191.9  mm     G. Age:  23w 6d         57  %    FL/BPD:     73.2   %    71 - 87  FL:       42.1  mm     G. Age:  23w 5d         48  %    FL/AC:      21.9   %    20 - 24  HUM:      39.6  mm     G. Age:  24w 1d  58  %  LV:        5.6  mm  Est. FW:     635  gm      1 lb 6 oz     63  %     FW Discordancy         4  % ---------------------------------------------------------------------- Gestational Age (Fetus B)  LMP:           23w 3d        Date:  08/15/20                 EDD:   05/22/21  U/S Today:     23w 6d                                        EDD:   05/19/21  Best:          23w 3d     Det. By:  LMP  (08/15/20)          EDD:   05/22/21 ---------------------------------------------------------------------- Anatomy (Fetus B)  Cranium:               Appears normal         Aortic Arch:            Not well visualized  Cavum:                  Appears normal         Ductal Arch:            Not well visualized  Ventricles:            Previously seen        Diaphragm:              Appears normal  Choroid Plexus:        Previously seen        Stomach:                Appears normal, left                                                                        sided  Cerebellum:            Previously seen        Abdomen:                Previously seen  Posterior Fossa:       Previously seen        Abdominal Wall:         Previously seen  Nuchal Fold:           Previously seen        Cord Vessels:           Previously seen  Face:                  Orbits and profile     Kidneys:                Appear normal  previously seen  Lips:                  Previously seen        Bladder:                Appears normal  Thoracic:              Appears normal         Spine:                  Previously seen  Heart:                 Previously seen        Upper Extremities:      Previously seen  RVOT:                  Not well visualized    Lower Extremities:      Previously seen  LVOT:                  Not well visualized  Other:  Female gender previously seen. Technically difficult due to maternal          habitus and fetal position. ---------------------------------------------------------------------- Cervix Uterus Adnexa  Cervix  Length:           5.21  cm.  Normal appearance by transabdominal scan.  Uterus  No abnormality visualized. ---------------------------------------------------------------------- Comments  This patient was seen due to a spontaneously conceived  dichorionic twin pregnancy and maternal obesity with a BMI  of 57.  She denies any problems since her last exam.  The fetal growth and amniotic fluid level appeared  appropriate for both twin A and twin B.  The views of the fetal anatomy remain limited today due to  extreme maternal body habitus.  The proximal cardiac views for twin A were visualized today  and appeared within normal  limits.  The cardiac views for twin  B were unable to be visualized today.  The patient was advised that we will continue to assess the  fetal anatomy during her future exams.  She understands that  sometimes it may not be possible to clear all the views of the  fetal anatomy during prenatal ultrasounds.  The limitations of ultrasound in the detection of all anomalies  was discussed today.  A follow-up exam was scheduled in 4 weeks to assess the  fetal growth and to try to complete the views of the fetal  anatomy. ----------------------------------------------------------------------                   Johnell Comings, MD Electronically Signed Final Report   01/26/2021 10:53 am ----------------------------------------------------------------------   Assessment and Plan:  Pregnancy: G2P1001 at [redacted]w[redacted]d 1. Hypertension affecting pregnancy in second trimester Elevated BP at 19 weeks and 25 weeks. No PEC symptoms. Will check labs today. Already on BASA.  Will continue to monitor closely. - Comprehensive metabolic panel - Protein / creatinine ratio, urine  2. Dizygotic twin pregnancy 3. Bicornuate uterus affecting pregnancy, antepartum Continue serial scans as per MFM.  4. Maternal morbid obesity, antepartum ([facility]) TWG 6 lbs, doing well.  GTT being done today, will follow up results and manage accordingly.  5. [redacted] weeks gestation of pregnancy 6. Supervision of high risk pregnancy, antepartum GTT and other labs are being done today, will follow up results and manage accordingly. Gave information about Tdap for next visit. Preterm labor symptoms and general obstetric precautions including but not limited  to vaginal bleeding, contractions, leaking of fluid and fetal movement were reviewed in detail with the patient. Please refer to After Visit Summary for other counseling recommendations.   Return in about 3 weeks (around 03/05/2021) for TDap, OFFICE OB VISIT (MD only).  Future Appointments  Date Time  Provider [place]  02/12/2021  9:30 AM [facility] LAB [facility]  02/23/2021  9:00 AM [facility] NURSE [facility]  02/23/2021  9:15 AM [facility] US2 [facility]    Verita Schneiders, MD

## 2021-02-13 LAB — GLUCOSE TOLERANCE, 2 HOURS W/ 1HR
Glucose, 1 hour: 154 mg/dL (ref 70–179)
Glucose, 2 hour: 148 mg/dL (ref 70–152)
Glucose, Fasting: 106 mg/dL — ABNORMAL HIGH (ref 70–91)

## 2021-02-13 LAB — CBC
Hematocrit: 28.6 % — ABNORMAL LOW (ref 34.0–46.6)
Hemoglobin: 8.8 g/dL — ABNORMAL LOW (ref 11.1–15.9)
MCH: 24.3 pg — ABNORMAL LOW (ref 26.6–33.0)
MCHC: 30.8 g/dL — ABNORMAL LOW (ref 31.5–35.7)
MCV: 79 fL (ref 79–97)
Platelets: 319 10*3/uL (ref 150–450)
RBC: 3.62 x10E6/uL — ABNORMAL LOW (ref 3.77–5.28)
RDW: 14.1 % (ref 11.7–15.4)
WBC: 8.4 10*3/uL (ref 3.4–10.8)

## 2021-02-13 LAB — PROTEIN / CREATININE RATIO, URINE
Creatinine, Urine: 309.5 mg/dL
Protein, Ur: 134.8 mg/dL
Protein/Creat Ratio: 436 mg/g creat — ABNORMAL HIGH (ref 0–200)

## 2021-02-13 LAB — RPR: RPR Ser Ql: NONREACTIVE

## 2021-02-13 LAB — HIV ANTIBODY (ROUTINE TESTING W REFLEX): HIV Screen 4th Generation wRfx: NONREACTIVE

## 2021-02-14 ENCOUNTER — Encounter: Payer: Self-pay | Admitting: Obstetrics & Gynecology

## 2021-02-14 DIAGNOSIS — O24419 Gestational diabetes mellitus in pregnancy, unspecified control: Secondary | ICD-10-CM | POA: Insufficient documentation

## 2021-02-14 DIAGNOSIS — Z8632 Personal history of gestational diabetes: Secondary | ICD-10-CM | POA: Insufficient documentation

## 2021-02-16 ENCOUNTER — Telehealth: Payer: Self-pay

## 2021-02-16 ENCOUNTER — Other Ambulatory Visit: Payer: Self-pay | Admitting: Obstetrics and Gynecology

## 2021-02-16 DIAGNOSIS — O24419 Gestational diabetes mellitus in pregnancy, unspecified control: Secondary | ICD-10-CM

## 2021-02-16 MED ORDER — ACCU-CHEK GUIDE W/DEVICE KIT: 1.0000 | PACK | Freq: Four times a day (QID) | 0 refills | Status: DC

## 2021-02-16 MED ORDER — GLUCOSE BLOOD VI STRP: ORAL_STRIP | 12 refills | Status: DC

## 2021-02-16 MED ORDER — ACCU-CHEK SOFTCLIX LANCETS MISC: 12 refills | Status: DC

## 2021-02-16 NOTE — Telephone Encounter (Signed)
-----   Message from Tereso Newcomer, MD sent at 02/14/2021 10:08 AM EST ----- Patient has GDM. Needs DM education and supplies.

## 2021-02-16 NOTE — Telephone Encounter (Signed)
Call placed to pt. Spoke with pt. Pt given results and recommendations per Dr Macon Large. Pt verbalized understanding and agreeable to plan of care. Pt scheduled with Marylene Land for Diabetes Ed on 12/13 at 1015am. Pt agreeable to date and time of appt. Also DM supplies sent to pharmacy on file and pt advised to pick up supplies and bring with her to appt. Pt voiced understanding.  Laney Pastor

## 2021-02-23 ENCOUNTER — Ambulatory Visit: Payer: Medicaid Other | Attending: Obstetrics

## 2021-02-23 ENCOUNTER — Other Ambulatory Visit: Payer: Self-pay

## 2021-02-23 ENCOUNTER — Other Ambulatory Visit: Payer: Self-pay | Admitting: *Deleted

## 2021-02-23 ENCOUNTER — Ambulatory Visit (HOSPITAL_BASED_OUTPATIENT_CLINIC_OR_DEPARTMENT_OTHER): Payer: Medicaid Other | Admitting: Obstetrics

## 2021-02-23 ENCOUNTER — Ambulatory Visit: Payer: Medicaid Other | Admitting: *Deleted

## 2021-02-23 VITALS — BP 127/74 | HR 115

## 2021-02-23 DIAGNOSIS — O0990 Supervision of high risk pregnancy, unspecified, unspecified trimester: Secondary | ICD-10-CM | POA: Insufficient documentation

## 2021-02-23 DIAGNOSIS — O162 Unspecified maternal hypertension, second trimester: Secondary | ICD-10-CM

## 2021-02-23 DIAGNOSIS — O99213 Obesity complicating pregnancy, third trimester: Secondary | ICD-10-CM | POA: Diagnosis present

## 2021-02-23 DIAGNOSIS — O99212 Obesity complicating pregnancy, second trimester: Secondary | ICD-10-CM

## 2021-02-23 DIAGNOSIS — O30042 Twin pregnancy, dichorionic/diamniotic, second trimester: Secondary | ICD-10-CM | POA: Insufficient documentation

## 2021-02-23 DIAGNOSIS — O24410 Gestational diabetes mellitus in pregnancy, diet controlled: Secondary | ICD-10-CM | POA: Insufficient documentation

## 2021-02-23 DIAGNOSIS — O30043 Twin pregnancy, dichorionic/diamniotic, third trimester: Secondary | ICD-10-CM | POA: Insufficient documentation

## 2021-02-23 DIAGNOSIS — Z6841 Body Mass Index (BMI) 40.0 and over, adult: Secondary | ICD-10-CM

## 2021-02-23 DIAGNOSIS — E669 Obesity, unspecified: Secondary | ICD-10-CM | POA: Diagnosis not present

## 2021-02-23 DIAGNOSIS — Z3A27 27 weeks gestation of pregnancy: Secondary | ICD-10-CM | POA: Diagnosis not present

## 2021-02-23 NOTE — Progress Notes (Signed)
MFM Note  Tonya Walls was seen due to a spontaneously conceived dichorionic twin pregnancy and maternal obesity with a BMI of 57.  She was recently diagnosed with diet-controlled gestational diabetes.  She also had a P/C ratio performed last week due to mildly elevated blood pressures that predicted 436 mg of protein.  Her PIH labs were within normal limits.  Her blood pressure today was 127/74.  The fetal growth and amniotic fluid level appeared appropriate for both twin A and twin B.  The EFW for twin A was 2 pound 6 ounces (36%).  The EFW for twin B was 2 pound 7 ounces (47%).  The views of the fetal anatomy remain limited today due to extreme maternal body habitus.    She understands that sometimes it may not be possible to clear all the views of the fetal anatomy during prenatal ultrasounds.  The limitations of ultrasound in the detection of all anomalies was discussed today.  The following were discussed today:  Diet-controlled gestational diabetes  The implications and management of diabetes in pregnancy was discussed in detail with the patient. She was advised that our goals for her fingerstick values are fasting values of 90-95 or less and two-hour postprandials of 120 or less.  Should the majority of her fingerstick values be above these values, she may have to be started on insulin or metformin to help her achieve better glycemic control.   The patient was advised that getting her fingerstick values as close to these goals as possible would provide her with the most optimal obstetrical outcome.  Possible preeclampsia  The patient was advised that her elevated P/C ratio may indicate that she is developing preeclampsia.  She was advised to continue to monitor her blood pressures and should call should her blood pressures be persistently greater than 140/90.  The signs and symptoms of preeclampsia were also reviewed today.  Due to the dichorionic twin gestation, gestational  diabetes, maternal obesity, and possible preeclampsia we will start weekly fetal testing at 32 weeks and continue these tests until delivery.  Should the diagnosis of preeclampsia be confirmed later in her pregnancy, delivery should be considered at between 36 to 37 weeks.  A follow-up growth scan and BPP was scheduled in 4 weeks.  The patient stated that all of her questions have been answered.  A total of 20 minutes was spent counseling and coordinating the care for this patient.  Greater than 50% of the time was spent in direct face-to-face contact.

## 2021-02-24 ENCOUNTER — Other Ambulatory Visit: Payer: Medicaid Other

## 2021-03-05 ENCOUNTER — Ambulatory Visit (INDEPENDENT_AMBULATORY_CARE_PROVIDER_SITE_OTHER): Payer: Medicaid Other | Admitting: Obstetrics and Gynecology

## 2021-03-05 ENCOUNTER — Other Ambulatory Visit: Payer: Self-pay

## 2021-03-05 VITALS — BP 120/70 | HR 113 | Wt 318.8 lb

## 2021-03-05 DIAGNOSIS — O3400 Maternal care for unspecified congenital malformation of uterus, unspecified trimester: Secondary | ICD-10-CM

## 2021-03-05 DIAGNOSIS — O99210 Obesity complicating pregnancy, unspecified trimester: Secondary | ICD-10-CM

## 2021-03-05 DIAGNOSIS — Z23 Encounter for immunization: Secondary | ICD-10-CM | POA: Diagnosis not present

## 2021-03-05 DIAGNOSIS — O162 Unspecified maternal hypertension, second trimester: Secondary | ICD-10-CM

## 2021-03-05 DIAGNOSIS — O30049 Twin pregnancy, dichorionic/diamniotic, unspecified trimester: Secondary | ICD-10-CM

## 2021-03-05 DIAGNOSIS — Q513 Bicornate uterus: Secondary | ICD-10-CM

## 2021-03-05 DIAGNOSIS — O0990 Supervision of high risk pregnancy, unspecified, unspecified trimester: Secondary | ICD-10-CM | POA: Diagnosis not present

## 2021-03-05 DIAGNOSIS — O99013 Anemia complicating pregnancy, third trimester: Secondary | ICD-10-CM

## 2021-03-05 DIAGNOSIS — O24419 Gestational diabetes mellitus in pregnancy, unspecified control: Secondary | ICD-10-CM

## 2021-03-05 DIAGNOSIS — Z3A28 28 weeks gestation of pregnancy: Secondary | ICD-10-CM

## 2021-03-05 DIAGNOSIS — Z6841 Body Mass Index (BMI) 40.0 and over, adult: Secondary | ICD-10-CM

## 2021-03-05 NOTE — Progress Notes (Signed)
PRENATAL VISIT NOTE  Subjective:  Tonya Walls is a 30 y.o. G2P1001 at [redacted]w[redacted]d being seen today for ongoing prenatal care.  She is currently monitored for the following issues for this high-risk pregnancy and has Bicornuate uterus affecting pregnancy, antepartum; Maternal morbid obesity, antepartum (HCC); Supervision of high risk pregnancy, antepartum; Anemia affecting pregnancy in third trimester; BMI 50.0-59.9, adult (HCC); Dizygotic twin pregnancy; Hypertension affecting pregnancy in second trimester; and Gestational diabetes mellitus, antepartum on their problem list.  Patient reports no complaints.  Contractions: Irritability. Vag. Bleeding: None.  Movement: Present. Denies leaking of fluid.   The following portions of the patient's history were reviewed and updated as appropriate: allergies, current medications, past family history, past medical history, past social history, past surgical history and problem list.   Objective:   Vitals:   03/05/21 1433  BP: 120/70  Pulse: (!) 113  Weight: (!) 318 lb 12.8 oz (144.6 kg)    Fetal Status: Fetal Heart Rate (bpm): 150/152   Movement: Present     General:  Alert, oriented and cooperative. Patient is in no acute distress.  Skin: Skin is warm and dry. No rash noted.   Cardiovascular: Normal heart rate noted  Respiratory: Normal respiratory effort, no problems with respiration noted  Abdomen: Soft, gravid, appropriate for gestational age.  Pain/Pressure: Present     Pelvic: Cervical exam deferred        Extremities: Normal range of motion.  Edema: None  Mental Status: Normal mood and affect. Normal behavior. Normal judgment and thought content.   Assessment and Plan:  Pregnancy: G2P1001 at [redacted]w[redacted]d 1. Supervision of high risk pregnancy, antepartum - Tdap vaccine greater than or equal to 7yo IM  2. [redacted] weeks gestation of pregnancy  3. Hypertension affecting pregnancy in second trimester Unsure if chronic or gestational/mild  pre-eclampsia or if she even has HTN at all; I went through her flowsheet and she had a few when she was pregnant in the past but nothing outside of pregnancy.  Also there is no baseline labs or repeat BP done when she had a mild range BP at 19 and 25wks. Normal today and will follow closely. I told her and her fob that delivery timing will be based on this.  Per mfm, will do qwk testing starting at 32wks  4. Dizygotic twin pregnancy Normal growth x 2 on 12/12. Follow up later re: delivery route/plan  5. BMI 50.0-59.9, adult (HCC) Weight stable  6. Maternal morbid obesity, antepartum (HCC)  7. Bicornuate uterus affecting pregnancy, antepartum Went to term last pregnancy  8. Gestational diabetes mellitus (GDM), antepartum, gestational diabetes method of control unspecified Missed dm education visit but has been checking sugars b/c FOB's mom is a DM. AM fasting <95 and 1h PP at most in the 140s. Log book given and front desk asked to r/s dm education visit  9. Anemia affecting pregnancy in third trimester D/w pt and will set up for iv; pt amenable to plan CBC Latest Ref Rng & Units 02/12/2021 11/03/2020 03/01/2018  WBC 3.4 - 10.8 x10E3/uL 8.4 6.4 -  Hemoglobin 11.1 - 15.9 g/dL 4.1(D) 6.2(I) 2.9(N)  Hematocrit 34.0 - 46.6 % 28.6(L) 30.9(L) -  Platelets 150 - 450 x10E3/uL 319 379 -   Preterm labor symptoms and general obstetric precautions including but not limited to vaginal bleeding, contractions, leaking of fluid and fetal movement were reviewed in detail with the patient. Please refer to After Visit Summary for other counseling recommendations.   Return  in about 10 days (around 03/15/2021) for md visit, in person, high risk ob.  Future Appointments  Date Time Provider [place]  03/17/2021  3:15 PM [facility]  03/19/2021 11:15 AM Kieth Brightly [facility]  03/24/2021  9:15 AM [facility] NURSE [facility]  03/24/2021  9:30 AM [facility] US3 [facility]   03/31/2021  9:15 AM [facility] NURSE [facility]  03/31/2021  9:30 AM [facility] US3 [facility]    Aletha Halim, MD

## 2021-03-15 NOTE — L&D Delivery Note (Signed)
OB/[facility]N [facility] Delivery Note  MALALA TRENKAMP is a 30 y.o. T7D2202 s/p SVD at [redacted]w[redacted]d. She was admitted for IOL due to A1GDM, pre-eclampsia without severe features, and dichorionic diamniotic twin gestation.   ROM: SROM with moderate meconium stained fluid for twin A, AROM with clear fluid for twin B GBS Status: Negative  Delivery Date/Time: 04/24/21 at 2110 and 2139   Delivery:  Called to room by RN due to patient feeling pressure. Upon arrival, twin A was delivered and on mother's abdomen with spontaneous cry. Cord clamped x 2 after 1-minute and cut by FOB under my direct supervision. Cord blood drawn.  Presentation of twin B then assessed. Found to be vertex on ultrasound. AROM performed with clear fluid. Fetal head of twin B well applied. Allowed for patient to labor down for about 15-20 minutes before she began to feel more pressure/urge to push. With the next contraction, fetal head of twin B delivered direct OP. No nuchal cord present. Shoulder and body delivered in usual fashion. Infant with spontaneous cry, placed on mother's abdomen, dried and stimulated. Cord clamped x 2 after 1-minute delay and cut by FOB under my direct supervision. Cord blood drawn.   Placentas then delivered spontaneously with gentle cord traction. Fundus firm with massage and Pitocin. TXA given. Labia, perineum, vagina, and cervix were inspected, and patient was found to have a 2nd degree perineal laceration that was repaired with 3-0 Vicryl and found to be hemostatic.   Placenta: Intact, 3VC x 2 - sent to pathology  Complications: None  Lacerations: 2nd degree perineal  EBL: 350 cc Analgesia: Epidural   Infant: Viable female infants   APGARs 9, 9 for twin A and 9, 9 for twin B   Evalina Field, MD OB/GYN Fellow, Faculty Practice

## 2021-03-17 ENCOUNTER — Other Ambulatory Visit: Payer: Medicaid Other

## 2021-03-18 ENCOUNTER — Non-Acute Institutional Stay (HOSPITAL_COMMUNITY)
Admission: RE | Admit: 2021-03-18 | Discharge: 2021-03-18 | Disposition: A | Discharge status: Home / Self Care | Payer: Medicaid Other | Source: Ambulatory Visit | Attending: Internal Medicine | Admitting: Internal Medicine

## 2021-03-18 ENCOUNTER — Other Ambulatory Visit: Payer: Self-pay

## 2021-03-18 DIAGNOSIS — O99013 Anemia complicating pregnancy, third trimester: Secondary | ICD-10-CM | POA: Diagnosis present

## 2021-03-18 MED ORDER — SODIUM CHLORIDE 0.9 % IV SOLN
500.0000 mg | INTRAVENOUS | Status: DC
  Administered 2021-03-18: 500 mg via INTRAVENOUS
  Filled 2021-03-18: qty 25

## 2021-03-18 MED ORDER — SODIUM CHLORIDE 0.9 % IV SOLN: INTRAVENOUS | Status: DC | PRN

## 2021-03-18 NOTE — Progress Notes (Signed)
PATIENT CARE CENTER NOTE   Diagnosis: Anemia affecting pregnancy in third trimester   Provider: Bellaire Bing, MD   Procedure: Venofer infusion    Note:  Patient received Venofer 500 mg infusion (dose 1 of 2) via PIV. No pre medications ordered. Patient tolerated infusion well with no adverse reaction. Vital signs stable. Discharge instructions given. Patient to come back in 1 week for second infusion. Alert, oriented and ambulatory at discharge.

## 2021-03-19 ENCOUNTER — Telehealth (INDEPENDENT_AMBULATORY_CARE_PROVIDER_SITE_OTHER): Payer: Medicaid Other | Admitting: Advanced Practice Midwife

## 2021-03-19 VITALS — BP 123/87 | HR 115

## 2021-03-19 DIAGNOSIS — O24419 Gestational diabetes mellitus in pregnancy, unspecified control: Secondary | ICD-10-CM

## 2021-03-19 DIAGNOSIS — O162 Unspecified maternal hypertension, second trimester: Secondary | ICD-10-CM

## 2021-03-19 DIAGNOSIS — O0993 Supervision of high risk pregnancy, unspecified, third trimester: Secondary | ICD-10-CM

## 2021-03-19 DIAGNOSIS — O163 Unspecified maternal hypertension, third trimester: Secondary | ICD-10-CM

## 2021-03-19 DIAGNOSIS — O30049 Twin pregnancy, dichorionic/diamniotic, unspecified trimester: Secondary | ICD-10-CM

## 2021-03-19 DIAGNOSIS — O0990 Supervision of high risk pregnancy, unspecified, unspecified trimester: Secondary | ICD-10-CM

## 2021-03-19 DIAGNOSIS — Z3A30 30 weeks gestation of pregnancy: Secondary | ICD-10-CM

## 2021-03-19 DIAGNOSIS — O30043 Twin pregnancy, dichorionic/diamniotic, third trimester: Secondary | ICD-10-CM

## 2021-03-19 NOTE — Progress Notes (Signed)
OBSTETRICS PRENATAL VIRTUAL VISIT ENCOUNTER NOTE  Provider location: [facility]   Patient location: Home  I connected with [place] on 03/19/21 at 11:15 AM EST by MyChart Video Encounter and verified that I am speaking with the correct person using two identifiers. I discussed the limitations, risks, security and privacy concerns of performing an evaluation and management service virtually and the availability of in person appointments. I also discussed with the patient that there may be a patient responsible charge related to this service. The patient expressed understanding and agreed to proceed. Subjective:  Tonya Walls is a 31 y.o. G2P1001 at [redacted]w[redacted]d being seen today for ongoing prenatal care.  She is currently monitored for the following issues for this high-risk pregnancy and has Bicornuate uterus affecting pregnancy, antepartum; Maternal morbid obesity, antepartum ([place]); Supervision of high risk pregnancy, antepartum; Anemia affecting pregnancy in third trimester; BMI 50.0-59.9, adult ([place]); Dizygotic twin pregnancy; Hypertension affecting pregnancy in second trimester; and Gestational diabetes mellitus, antepartum on their problem list.  Patient reports no complaints.  Contractions: Irritability. Vag. Bleeding: None.  Movement: Present. Denies any leaking of fluid.   The following portions of the patient's history were reviewed and updated as appropriate: allergies, current medications, past family history, past medical history, past social history, past surgical history and problem list.   Objective:   Vitals:   03/19/21 1130  BP: 123/87  Pulse: (!) 115    Fetal Status:     Movement: Present     General:  Alert, oriented and cooperative. Patient is in no acute distress.  Respiratory: Normal respiratory effort, no problems with respiration noted  Mental Status: Normal mood and affect. Normal behavior. Normal judgment and thought  content.  Rest of physical exam deferred due to type of encounter  Imaging: [place] MFM OB FOLLOW UP  Result Date: 02/23/2021 ----------------------------------------------------------------------  OBSTETRICS REPORT                       (Signed Final 02/23/2021 11:43 am) ---------------------------------------------------------------------- Patient Info  ID #:       [id number]                          D.O.B.:  [birth year] (30 yrs)  Name:       Tonya Walls                  Visit Date: 02/23/2021 09:56 am ---------------------------------------------------------------------- Performed By  Attending:        Johnell Comings MD         Secondary Phy.:   Woodroe Mode MD  Performed By:     Maryfrances Bunnell      Address:          [address]  [place], [place]  Referred By:      [place]          [facility]                                [facility] Care at                                                             [facility]                                                             Women  Ref. Address:     [address]                    [place], [place] ---------------------------------------------------------------------- Orders  #  Description                           Code        Ordered By  1  [place] MFM OB FOLLOW UP                   [id number]    YU FANG  2  [place] MFM OB FOLLOW UP ADDL              [id number]    YU FANG     GEST ----------------------------------------------------------------------  #  Order #                     Accession #                Episode #  1  [id number]                   [id number]                 [id number]  2  [id number]                   [id number]  [id number] ---------------------------------------------------------------------- Indications  Twin pregnancy, di/di, second trimester        O30.042  Obesity complicating pregnancy, second         O99.212  trimester (BMI 57)  Uterine abnormality during pregnancy           O34.599  (Bicornuate Uterus)  Anemia during pregnancy in second trimester    O99.012  [redacted] weeks gestation of pregnancy                Z3A.27  Low Risk NIPS(Dizygotic Fraternal  Twins)(Negative Horizon)  Antenatal follow-up for nonvisualized fetal    Z36.2  anatomy  Encounter for other antenatal screening        Z36.2  follow-up ---------------------------------------------------------------------- Vital Signs                                                 Height:        5'3" ---------------------------------------------------------------------- Fetal Evaluation (Fetus A)  Num Of Fetuses:         2  Fetal Heart Rate(bpm):  155  Cardiac Activity:       Observed  Fetal Lie:              Maternal left side  Presentation:           Cephalic  Placenta:               Anterior  P. Cord Insertion:      Previously Visualized  Membrane Desc:      Dividing Membrane seen - Dichorionic.  Amniotic Fluid  AFI FV:      Within normal limits                              Largest Pocket(cm)                              5.1 ---------------------------------------------------------------------- Biometry (Fetus A)  BPD:      65.6  mm     G. Age:  26w 3d         12  %    CI:        66.96   %    70 - 86                                                          FL/HC:      19.3   %    18.6 - 20.4  HC:      256.8  mm     G. Age:  27w 6d         36  %    HC/AC:      1.09        1.05 - 1.21  AC:      235.7  mm     G. Age:  27w 6d         56  %    FL/BPD:     75.6   %    71 - 87  FL:       49.6  mm     G. Age:  26w 5d         17  %    FL/AC:      21.0   %    20 - 24  Est. FW:    1070  gm      2 lb 6 oz     36  %     FW Discordancy         4  %  ---------------------------------------------------------------------- OB History  Gravidity:    2         Term:   1        Prem:   0        SAB:   0  TOP:          0       Ectopic:  0        Living: 1 ---------------------------------------------------------------------- Gestational Age (Fetus A)  LMP:           27w 3d        Date:  08/15/20                 EDD:   05/22/21  U/S Today:     27w 2d                                        EDD:   05/23/21  Best:          27w 3d     Det. By:  LMP  (08/15/20)          EDD:   05/22/21 ---------------------------------------------------------------------- Anatomy (Fetus A)  Cranium:               Appears normal         Aortic Arch:            Appears normal  Cavum:                 Appears normal         Ductal Arch:            Appears normal  Ventricles:            Previously seen        Diaphragm:              Appears normal  Choroid Plexus:        Previously seen        Stomach:                Appears normal, left                                                                        sided  Cerebellum:            Previously seen        Abdomen:                Previously seen  Posterior Fossa:       Previously seen        Abdominal Wall:         Previously seen  Nuchal Fold:           Previously seen        Cord Vessels:           Previously seen  Face:                  Orbits and profile     Kidneys:                Appear normal                         previously seen  Lips:                  Appears normal         Bladder:                Appears normal  Thoracic:              Appears normal         Spine:                  Previously seen  Heart:                 Appears normal         Upper Extremities:      Previously seen                         (4CH, axis, and                         situs)  RVOT:                  Appears normal         Lower Extremities:      Previously seen  LVOT:                  Appears normal  Other:  Fetus previously seen to be female. Heels prev  visualized. Technically          difficult due to maternal habitus and fetal positon. ---------------------------------------------------------------------- Fetal Evaluation (Fetus B)  Num Of Fetuses:         2  Fetal Heart Rate(bpm):  157  Cardiac Activity:       Observed  Fetal Lie:              Maternal right side  Presentation:           Transverse, head to maternal left  Placenta:               Posterior  P. Cord Insertion:      Previously Visualized  Membrane Desc:      Dividing Membrane seen - Dichorionic.  Amniotic Fluid  AFI FV:      Within normal limits                              Largest Pocket(cm)                              4.13 ---------------------------------------------------------------------- Biometry (Fetus B)  BPD:      67.4  mm     G. Age:  27w 1d         29  %    CI:  72.19   %    70 - 86                                                          FL/HC:      19.7   %    18.6 - 20.4  HC:      252.4  mm     G. Age:  27w 3d         21  %    HC/AC:      1.05        1.05 - 1.21  AC:       241   mm     G. Age:  28w 3d         71  %    FL/BPD:     73.9   %    71 - 87  FL:       49.8  mm     G. Age:  26w 6d         19  %    FL/AC:      20.7   %    20 - 24  LV:        5.7  mm  Est. FW:    1110  gm      2 lb 7 oz     47  %     FW Discordancy      0 \ 4 % ---------------------------------------------------------------------- Gestational Age (Fetus B)  LMP:           27w 3d        Date:  08/15/20                 EDD:   05/22/21  U/S Today:     27w 3d                                        EDD:   05/22/21  Best:          27w 3d     Det. By:  LMP  (08/15/20)          EDD:   05/22/21 ---------------------------------------------------------------------- Anatomy (Fetus B)  Cranium:               Appears normal         Aortic Arch:            Not well visualized  Cavum:                 Appears normal         Ductal Arch:            Not well visualized  Ventricles:            Appears normal          Diaphragm:              Previously seen  Choroid Plexus:        Previously seen        Stomach:                Appears normal, left  sided  Cerebellum:            Previously seen        Abdomen:                Previously seen  Posterior Fossa:       Previously seen        Abdominal Wall:         Previously seen  Nuchal Fold:           Previously seen        Cord Vessels:           Previously seen  Face:                  Orbits and profile     Kidneys:                Appear normal                         previously seen  Lips:                  Previously seen        Bladder:                Appears normal  Thoracic:              Appears normal         Spine:                  Previously seen  Heart:                 Previously seen        Upper Extremities:      Previously seen  RVOT:                  Not well visualized    Lower Extremities:      Previously seen  LVOT:                  Not well visualized  Other:  Female gender previously seen. Technically difficult due to maternal          habitus and fetal position. ---------------------------------------------------------------------- Cervix Uterus Adnexa  Cervix  Not visualized (advanced GA >24wks)  Uterus  No abnormality visualized.  Right Ovary  Not visualized.  Left Ovary  Not visualized.  Adnexa  No adnexal mass visualized. ---------------------------------------------------------------------- Comments  Charlynn Grimes was seen due to a spontaneously conceived  dichorionic twin pregnancy and maternal obesity with a BMI  of 57.  She was recently diagnosed with diet-controlled  gestational diabetes.  She also had a P/C ratio performed last  week due to mildly elevated blood pressures that predicted  436 mg of protein.  Her [place] labs were within normal limits.  Her blood pressure today was 127/74.  The fetal growth and amniotic fluid level appeared  appropriate for both twin A and twin B.  The EFW for  twin A  was 2 pound 6 ounces (36%).  The EFW for twin B was 2  pound 7 ounces (47%).  The views of the fetal anatomy remain limited today due to  extreme maternal body habitus.  She understands that sometimes it may not be possible to  clear all the views of the fetal anatomy during prenatal  ultrasounds.  The limitations of ultrasound in the detection of all anomalies  was discussed today.  The following  were discussed today:  Diet-controlled gestational diabetes  The implications and management of diabetes in pregnancy  was discussed in detail with the patient. She was advised that  our goals for her fingerstick values are fasting values of 90-95  or less and two-hour postprandials of 120 or less.  Should the  majority of her fingerstick values be above these values, she  may have to be started on insulin or metformin to help her  achieve better glycemic control.  The patient was advised that getting her fingerstick values as  close to these goals as possible would provide her with the  most optimal obstetrical outcome.  Possible preeclampsia  The patient was advised that her elevated P/C ratio may  indicate that she is developing preeclampsia.  She was advised to continue to monitor her blood pressures  and should call should her blood pressures be persistently  greater than 140/90.  The signs and symptoms of  preeclampsia were also reviewed today.  Due to the dichorionic twin gestation, gestational diabetes,  maternal obesity, and possible preeclampsia we will start  weekly fetal testing at 32 weeks and continue these tests until  delivery.  Should the diagnosis of preeclampsia be confirmed later in  her pregnancy, delivery should be considered at between 36  to 37 weeks.  A follow-up growth scan and BPP was scheduled in 4 weeks.  The patient stated that all of her questions have been  answered.  A total of 20 minutes was spent counseling and coordinating  the care for this patient.  Greater than 50% of the time  was  spent in direct face-to-face contact. ----------------------------------------------------------------------                   Johnell Comings, MD Electronically Signed Final Report   02/23/2021 11:43 am ----------------------------------------------------------------------  [place] MFM OB FOLLOW UP ADDL GEST  Result Date: 02/23/2021 ----------------------------------------------------------------------  OBSTETRICS REPORT                       (Signed Final 02/23/2021 11:43 am) ---------------------------------------------------------------------- Patient Info  ID #:       [id number]                          D.O.B.:  Aug 02, 1990 (30 yrs)  Name:       Tonya Walls                  Visit Date: 02/23/2021 09:56 am ---------------------------------------------------------------------- Performed By  Attending:        Johnell Comings MD         Secondary Phy.:   Woodroe Mode MD  Performed By:     Maryfrances Bunnell      Address:          [address]  [place], [place]  Referred By:      [place]          Location:         [facility]                                Fetal Care at                                                             [facility] for                                                             Women  Ref. Address:     [address]                    [place], [place] ---------------------------------------------------------------------- Orders  #  Description                           Code        Ordered By  1  [place] MFM OB FOLLOW UP                   [id number]    YU FANG  2  [place] MFM OB FOLLOW UP ADDL              [id number]    YU FANG     GEST  ----------------------------------------------------------------------  #  Order #                     Accession #                Episode #  1  [id number]                   [id number]                 [id number]  2  [id number]                   [id number]  [id number] ---------------------------------------------------------------------- Indications  Twin pregnancy, di/di, second trimester        123456  Obesity complicating pregnancy, second         O99.212  trimester (BMI 57)  Uterine abnormality during pregnancy           O34.599  (Bicornuate Uterus)  Anemia during pregnancy in second trimester    O99.012  [redacted] weeks gestation of pregnancy                Z3A.27  Low Risk NIPS(Dizygotic Fraternal  Twins)(Negative Horizon)  Antenatal follow-up for nonvisualized fetal    Z36.2  anatomy  Encounter for other antenatal screening        Z36.2  follow-up ---------------------------------------------------------------------- Vital Signs                                                 Height:        5'3" ---------------------------------------------------------------------- Fetal Evaluation (Fetus A)  Num Of Fetuses:         2  Fetal Heart Rate(bpm):  155  Cardiac Activity:       Observed  Fetal Lie:              Maternal left side  Presentation:           Cephalic  Placenta:               Anterior  P. Cord Insertion:      Previously Visualized  Membrane Desc:      Dividing Membrane seen - Dichorionic.  Amniotic Fluid  AFI FV:      Within normal limits                              Largest Pocket(cm)                              5.1 ---------------------------------------------------------------------- Biometry (Fetus A)  BPD:      65.6  mm     G. Age:  26w 3d         12  %    CI:        66.96   %    70 - 86                                                          FL/HC:      19.3   %    18.6 - 20.4  HC:      256.8  mm     G. Age:  27w 6d         36  %    HC/AC:      1.09        1.05 - 1.21  AC:      235.7  mm      G. Age:  27w 6d         56  %    FL/BPD:     75.6   %    71 - 87  FL:  49.6  mm     G. Age:  26w 5d         17  %    FL/AC:      21.0   %    20 - 24  Est. FW:    1070  gm      2 lb 6 oz     36  %     FW Discordancy         4  % ---------------------------------------------------------------------- OB History  Gravidity:    2         Term:   1        Prem:   0        SAB:   0  TOP:          0       Ectopic:  0        Living: 1 ---------------------------------------------------------------------- Gestational Age (Fetus A)  LMP:           27w 3d        Date:  08/15/20                 EDD:   05/22/21  U/S Today:     27w 2d                                        EDD:   05/23/21  Best:          27w 3d     Det. By:  LMP  (08/15/20)          EDD:   05/22/21 ---------------------------------------------------------------------- Anatomy (Fetus A)  Cranium:               Appears normal         Aortic Arch:            Appears normal  Cavum:                 Appears normal         Ductal Arch:            Appears normal  Ventricles:            Previously seen        Diaphragm:              Appears normal  Choroid Plexus:        Previously seen        Stomach:                Appears normal, left                                                                        sided  Cerebellum:            Previously seen        Abdomen:                Previously seen  Posterior Fossa:       Previously seen        Abdominal Wall:         Previously seen  Nuchal Fold:           Previously seen        Cord Vessels:           Previously seen  Face:                  Orbits and profile     Kidneys:                Appear normal                         previously seen  Lips:                  Appears normal         Bladder:                Appears normal  Thoracic:              Appears normal         Spine:                  Previously seen  Heart:                 Appears normal         Upper Extremities:      Previously seen                          (4CH, axis, and                         situs)  RVOT:                  Appears normal         Lower Extremities:      Previously seen  LVOT:                  Appears normal  Other:  Fetus previously seen to be female. Heels prev visualized. Technically          difficult due to maternal habitus and fetal positon. ---------------------------------------------------------------------- Fetal Evaluation (Fetus B)  Num Of Fetuses:         2  Fetal Heart Rate(bpm):  157  Cardiac Activity:       Observed  Fetal Lie:              Maternal right side  Presentation:           Transverse, head to maternal left  Placenta:               Posterior  P. Cord Insertion:      Previously Visualized  Membrane Desc:      Dividing Membrane seen - Dichorionic.  Amniotic Fluid  AFI FV:      Within normal limits                              Largest Pocket(cm)                              4.13 ---------------------------------------------------------------------- Biometry (Fetus B)  BPD:      67.4  mm     G. Age:  27w 1d         29  %    CI:  72.19   %    70 - 86                                                          FL/HC:      19.7   %    18.6 - 20.4  HC:      252.4  mm     G. Age:  27w 3d         21  %    HC/AC:      1.05        1.05 - 1.21  AC:       241   mm     G. Age:  28w 3d         71  %    FL/BPD:     73.9   %    71 - 87  FL:       49.8  mm     G. Age:  26w 6d         19  %    FL/AC:      20.7   %    20 - 24  LV:        5.7  mm  Est. FW:    1110  gm      2 lb 7 oz     47  %     FW Discordancy      0 \ 4 % ---------------------------------------------------------------------- Gestational Age (Fetus B)  LMP:           27w 3d        Date:  08/15/20                 EDD:   05/22/21  U/S Today:     27w 3d                                        EDD:   05/22/21  Best:          27w 3d     Det. By:  LMP  (08/15/20)          EDD:   05/22/21 ---------------------------------------------------------------------- Anatomy (Fetus B)   Cranium:               Appears normal         Aortic Arch:            Not well visualized  Cavum:                 Appears normal         Ductal Arch:            Not well visualized  Ventricles:            Appears normal         Diaphragm:              Previously seen  Choroid Plexus:        Previously seen        Stomach:                Appears normal, left  sided  Cerebellum:            Previously seen        Abdomen:                Previously seen  Posterior Fossa:       Previously seen        Abdominal Wall:         Previously seen  Nuchal Fold:           Previously seen        Cord Vessels:           Previously seen  Face:                  Orbits and profile     Kidneys:                Appear normal                         previously seen  Lips:                  Previously seen        Bladder:                Appears normal  Thoracic:              Appears normal         Spine:                  Previously seen  Heart:                 Previously seen        Upper Extremities:      Previously seen  RVOT:                  Not well visualized    Lower Extremities:      Previously seen  LVOT:                  Not well visualized  Other:  Female gender previously seen. Technically difficult due to maternal          habitus and fetal position. ---------------------------------------------------------------------- Cervix Uterus Adnexa  Cervix  Not visualized (advanced GA >24wks)  Uterus  No abnormality visualized.  Right Ovary  Not visualized.  Left Ovary  Not visualized.  Adnexa  No adnexal mass visualized. ---------------------------------------------------------------------- Comments  Charlynn Grimes was seen due to a spontaneously conceived  dichorionic twin pregnancy and maternal obesity with a BMI  of 57.  She was recently diagnosed with diet-controlled  gestational diabetes.  She also had a P/C ratio performed last  week due to mildly elevated blood  pressures that predicted  436 mg of protein.  Her [place] labs were within normal limits.  Her blood pressure today was 127/74.  The fetal growth and amniotic fluid level appeared  appropriate for both twin A and twin B.  The EFW for twin A  was 2 pound 6 ounces (36%).  The EFW for twin B was 2  pound 7 ounces (47%).  The views of the fetal anatomy remain limited today due to  extreme maternal body habitus.  She understands that sometimes it may not be possible to  clear all the views of the fetal anatomy during prenatal  ultrasounds.  The limitations of ultrasound in the detection of all anomalies  was discussed today.  The following  were discussed today:  Diet-controlled gestational diabetes  The implications and management of diabetes in pregnancy  was discussed in detail with the patient. She was advised that  our goals for her fingerstick values are fasting values of 90-95  or less and two-hour postprandials of 120 or less.  Should the  majority of her fingerstick values be above these values, she  may have to be started on insulin or metformin to help her  achieve better glycemic control.  The patient was advised that getting her fingerstick values as  close to these goals as possible would provide her with the  most optimal obstetrical outcome.  Possible preeclampsia  The patient was advised that her elevated P/C ratio may  indicate that she is developing preeclampsia.  She was advised to continue to monitor her blood pressures  and should call should her blood pressures be persistently  greater than 140/90.  The signs and symptoms of  preeclampsia were also reviewed today.  Due to the dichorionic twin gestation, gestational diabetes,  maternal obesity, and possible preeclampsia we will start  weekly fetal testing at 32 weeks and continue these tests until  delivery.  Should the diagnosis of preeclampsia be confirmed later in  her pregnancy, delivery should be considered at between 36  to 37 weeks.  A follow-up  growth scan and BPP was scheduled in 4 weeks.  The patient stated that all of her questions have been  answered.  A total of 20 minutes was spent counseling and coordinating  the care for this patient.  Greater than 50% of the time was  spent in direct face-to-face contact. ----------------------------------------------------------------------                   Johnell Comings, MD Electronically Signed Final Report   02/23/2021 11:43 am ----------------------------------------------------------------------   Assessment and Plan:  Pregnancy: G2P1001 at [redacted]w[redacted]d 1. Supervision of high risk pregnancy, antepartum - Routine care - MyChart per patient preference due to last minute change in her schedule  2. Hypertension affecting pregnancy in second trimester - Mild elevation, normal on recheck, asymptomatic  3. Gestational diabetes mellitus (GDM), antepartum, gestational diabetes method of control unspecified - Occasional elevations to 137 after dinner, always in context of food choice. Revisited diet recommendations  4. Dizygotic twin pregnancy   5. [redacted] weeks gestation of pregnancy   Preterm labor symptoms and general obstetric precautions including but not limited to vaginal bleeding, contractions, leaking of fluid and fetal movement were reviewed in detail with the patient. I discussed the assessment and treatment plan with the patient. The patient was provided an opportunity to ask questions and all were answered. The patient agreed with the plan and demonstrated an understanding of the instructions. The patient was advised to call back or seek an in-person office evaluation/go to MAU at [facility] for any urgent or concerning symptoms. Please refer to After Visit Summary for other counseling recommendations.   I provided 8 minutes of face-to-face time during this encounter.  Return in about 2 weeks (around 04/02/2021).  Future Appointments  Date Time Provider [place]   03/24/2021  9:15 AM [facility]-MFC NURSE [facility]  03/24/2021  9:30 AM W[facility]MFC US3 [facility]  03/24/2021  1:15 PM WMC-EDUCATION W[facility]CWH [facility]  03/25/2021 10:00 AM WL-SCAC [place] WL-SCAC None  03/31/2021  9:15 AM WMC-MFC NURSE W[facility]MFC [facility]  03/31/2021  9:30 AM WMC-MFC US3 WMC-MFCUS [place] for [employer]

## 2021-03-19 NOTE — Patient Instructions (Signed)
[email or web address]

## 2021-03-24 ENCOUNTER — Other Ambulatory Visit: Payer: Medicaid Other

## 2021-03-24 ENCOUNTER — Other Ambulatory Visit: Payer: Self-pay | Admitting: *Deleted

## 2021-03-24 ENCOUNTER — Encounter: Payer: Self-pay | Admitting: *Deleted

## 2021-03-24 ENCOUNTER — Ambulatory Visit: Payer: Medicaid Other | Attending: Obstetrics

## 2021-03-24 ENCOUNTER — Ambulatory Visit: Payer: Medicaid Other | Admitting: *Deleted

## 2021-03-24 ENCOUNTER — Other Ambulatory Visit: Payer: Self-pay

## 2021-03-24 VITALS — BP 136/80 | HR 108

## 2021-03-24 DIAGNOSIS — O24410 Gestational diabetes mellitus in pregnancy, diet controlled: Secondary | ICD-10-CM

## 2021-03-24 DIAGNOSIS — Z3A31 31 weeks gestation of pregnancy: Secondary | ICD-10-CM

## 2021-03-24 DIAGNOSIS — O162 Unspecified maternal hypertension, second trimester: Secondary | ICD-10-CM | POA: Diagnosis present

## 2021-03-24 DIAGNOSIS — O99213 Obesity complicating pregnancy, third trimester: Secondary | ICD-10-CM

## 2021-03-24 DIAGNOSIS — O3403 Maternal care for unspecified congenital malformation of uterus, third trimester: Secondary | ICD-10-CM

## 2021-03-24 DIAGNOSIS — O133 Gestational [pregnancy-induced] hypertension without significant proteinuria, third trimester: Secondary | ICD-10-CM

## 2021-03-24 DIAGNOSIS — O0990 Supervision of high risk pregnancy, unspecified, unspecified trimester: Secondary | ICD-10-CM

## 2021-03-24 DIAGNOSIS — O30043 Twin pregnancy, dichorionic/diamniotic, third trimester: Secondary | ICD-10-CM

## 2021-03-24 DIAGNOSIS — O30042 Twin pregnancy, dichorionic/diamniotic, second trimester: Secondary | ICD-10-CM | POA: Diagnosis present

## 2021-03-24 DIAGNOSIS — Q513 Bicornate uterus: Secondary | ICD-10-CM

## 2021-03-24 DIAGNOSIS — Z6841 Body Mass Index (BMI) 40.0 and over, adult: Secondary | ICD-10-CM | POA: Diagnosis present

## 2021-03-25 ENCOUNTER — Encounter (HOSPITAL_COMMUNITY): Payer: Medicaid Other

## 2021-03-31 ENCOUNTER — Ambulatory Visit: Payer: Medicaid Other | Admitting: *Deleted

## 2021-03-31 ENCOUNTER — Ambulatory Visit: Payer: Medicaid Other | Attending: Obstetrics

## 2021-03-31 ENCOUNTER — Encounter: Payer: Self-pay | Admitting: *Deleted

## 2021-03-31 ENCOUNTER — Other Ambulatory Visit: Payer: Self-pay

## 2021-03-31 VITALS — BP 134/83 | HR 106

## 2021-03-31 DIAGNOSIS — O30043 Twin pregnancy, dichorionic/diamniotic, third trimester: Secondary | ICD-10-CM | POA: Diagnosis not present

## 2021-03-31 DIAGNOSIS — O0990 Supervision of high risk pregnancy, unspecified, unspecified trimester: Secondary | ICD-10-CM

## 2021-03-31 DIAGNOSIS — Z3A32 32 weeks gestation of pregnancy: Secondary | ICD-10-CM | POA: Diagnosis not present

## 2021-03-31 DIAGNOSIS — O24410 Gestational diabetes mellitus in pregnancy, diet controlled: Secondary | ICD-10-CM

## 2021-03-31 DIAGNOSIS — O162 Unspecified maternal hypertension, second trimester: Secondary | ICD-10-CM

## 2021-03-31 DIAGNOSIS — Z6841 Body Mass Index (BMI) 40.0 and over, adult: Secondary | ICD-10-CM | POA: Diagnosis present

## 2021-03-31 DIAGNOSIS — O30042 Twin pregnancy, dichorionic/diamniotic, second trimester: Secondary | ICD-10-CM

## 2021-04-01 ENCOUNTER — Non-Acute Institutional Stay (HOSPITAL_COMMUNITY)
Admission: RE | Admit: 2021-04-01 | Discharge: 2021-04-01 | Disposition: A | Discharge status: Home / Self Care | Payer: Medicaid Other | Source: Ambulatory Visit | Attending: Internal Medicine | Admitting: Internal Medicine

## 2021-04-01 DIAGNOSIS — Z3A00 Weeks of gestation of pregnancy not specified: Secondary | ICD-10-CM | POA: Diagnosis not present

## 2021-04-01 DIAGNOSIS — O99013 Anemia complicating pregnancy, third trimester: Secondary | ICD-10-CM | POA: Insufficient documentation

## 2021-04-01 MED ORDER — SODIUM CHLORIDE 0.9 % IV SOLN
500.0000 mg | Freq: Once | INTRAVENOUS | Status: AC
  Administered 2021-04-01: 500 mg via INTRAVENOUS
  Filled 2021-04-01: qty 25

## 2021-04-01 MED ORDER — SODIUM CHLORIDE 0.9 % IV SOLN: INTRAVENOUS | Status: DC | PRN

## 2021-04-01 NOTE — Progress Notes (Signed)
Patient received IV Venofer 500mg  ( dose 2 of 2) as ordered by Aletha Halim MD. Pre meds not required per orders. Tolerated well, vitals stable, discharge instructions given , verbalized understanding. Patient alert, oriented, and ambulatory at the time of discharge, accompanied by family member.

## 2021-04-02 ENCOUNTER — Other Ambulatory Visit: Payer: Self-pay

## 2021-04-02 ENCOUNTER — Ambulatory Visit (INDEPENDENT_AMBULATORY_CARE_PROVIDER_SITE_OTHER): Payer: Medicaid Other | Admitting: Obstetrics & Gynecology

## 2021-04-02 VITALS — BP 102/70 | HR 120 | Wt 336.5 lb

## 2021-04-02 DIAGNOSIS — Q513 Bicornate uterus: Secondary | ICD-10-CM

## 2021-04-02 DIAGNOSIS — O162 Unspecified maternal hypertension, second trimester: Secondary | ICD-10-CM

## 2021-04-02 DIAGNOSIS — O3400 Maternal care for unspecified congenital malformation of uterus, unspecified trimester: Secondary | ICD-10-CM

## 2021-04-02 DIAGNOSIS — O24410 Gestational diabetes mellitus in pregnancy, diet controlled: Secondary | ICD-10-CM

## 2021-04-02 DIAGNOSIS — O0990 Supervision of high risk pregnancy, unspecified, unspecified trimester: Secondary | ICD-10-CM

## 2021-04-02 DIAGNOSIS — O30049 Twin pregnancy, dichorionic/diamniotic, unspecified trimester: Secondary | ICD-10-CM

## 2021-04-02 NOTE — Progress Notes (Signed)
° °  PRENATAL VISIT NOTE  Subjective:  Tonya Walls is a 31 y.o. G2P1001 at [redacted]w[redacted]d being seen today for ongoing prenatal care.  She is currently monitored for the following issues for this high-risk pregnancy and has Bicornuate uterus affecting pregnancy, antepartum; Maternal morbid obesity, antepartum (HCC); Supervision of high risk pregnancy, antepartum; Anemia affecting pregnancy in third trimester; BMI 50.0-59.9, adult (HCC); Dizygotic twin pregnancy; Hypertension affecting pregnancy in second trimester; and Gestational diabetes mellitus, antepartum on their problem list.  Patient reports no complaints.  Contractions: Irregular. Vag. Bleeding: None.  Movement: Present. Denies leaking of fluid.   The following portions of the patient's history were reviewed and updated as appropriate: allergies, current medications, past family history, past medical history, past social history, past surgical history and problem list.   Objective:   Vitals:   04/02/21 1356  BP: 102/70  Pulse: (!) 120  Weight: (!) 336 lb 8 oz (152.6 kg)    Fetal Status: Fetal Heart Rate (bpm): 160/140 Fundal Height: 41 cm Movement: Present     General:  Alert, oriented and cooperative. Patient is in no acute distress.  Skin: Skin is warm and dry. No rash noted.   Cardiovascular: Normal heart rate noted  Respiratory: Normal respiratory effort, no problems with respiration noted  Abdomen: Soft, gravid, appropriate for gestational age.  Pain/Pressure: Present     Pelvic: Cervical exam deferred        Extremities: Normal range of motion.  Edema: Trace  Mental Status: Normal mood and affect. Normal behavior. Normal judgment and thought content.   Assessment and Plan:  Pregnancy: G2P1001 at [redacted]w[redacted]d There are no diagnoses linked to this encounter. Preterm labor symptoms and general obstetric precautions including but not limited to vaginal bleeding, contractions, leaking of fluid and fetal movement were reviewed in detail  with the patient. Please refer to After Visit Summary for other counseling recommendations.   Return in about 2 weeks (around 04/16/2021).  Future Appointments  Date Time Provider Department Center  04/06/2021  9:45 AM W[facility]-MFC NURSE W[facility]MFC [facility]  04/06/2021 10:00 AM W[facility]-MFC US1 W[facility]  04/13/2021 11:15 AM WMC-MFC NURSE W[facility]-MFC [facility]  04/13/2021 11:30 AM WMC-MFC US3 W[facility]  04/15/2021  2:15 PM Milas Hock, MD [facility]  04/22/2021 10:00 AM WMC-MFC NURSE WMC-MFC [facility]  04/22/2021 10:15 AM WMC-MFC US2 W[facility]-MFCUS [facility]  04/29/2021 10:55 AM Federico Flake, MD [facility]  05/06/2021  9:15 AM Milas Hock, MD [facility]  05/13/2021  9:15 AM Macon Large, Jethro Bastos, MD [facility]  05/20/2021  9:15 AM Milas Hock, MD [facility]    Scheryl Darter, MD

## 2021-04-06 ENCOUNTER — Ambulatory Visit: Payer: Medicaid Other | Admitting: *Deleted

## 2021-04-06 ENCOUNTER — Other Ambulatory Visit: Payer: Self-pay

## 2021-04-06 ENCOUNTER — Ambulatory Visit: Payer: Medicaid Other | Attending: Obstetrics and Gynecology

## 2021-04-06 ENCOUNTER — Encounter: Payer: Self-pay | Admitting: *Deleted

## 2021-04-06 VITALS — BP 133/71 | HR 108

## 2021-04-06 DIAGNOSIS — O0990 Supervision of high risk pregnancy, unspecified, unspecified trimester: Secondary | ICD-10-CM | POA: Insufficient documentation

## 2021-04-06 DIAGNOSIS — O162 Unspecified maternal hypertension, second trimester: Secondary | ICD-10-CM | POA: Diagnosis present

## 2021-04-06 DIAGNOSIS — O99213 Obesity complicating pregnancy, third trimester: Secondary | ICD-10-CM

## 2021-04-06 DIAGNOSIS — O3403 Maternal care for unspecified congenital malformation of uterus, third trimester: Secondary | ICD-10-CM | POA: Diagnosis present

## 2021-04-06 DIAGNOSIS — Z3A33 33 weeks gestation of pregnancy: Secondary | ICD-10-CM

## 2021-04-06 DIAGNOSIS — O30043 Twin pregnancy, dichorionic/diamniotic, third trimester: Secondary | ICD-10-CM

## 2021-04-06 DIAGNOSIS — O133 Gestational [pregnancy-induced] hypertension without significant proteinuria, third trimester: Secondary | ICD-10-CM

## 2021-04-06 DIAGNOSIS — O24410 Gestational diabetes mellitus in pregnancy, diet controlled: Secondary | ICD-10-CM | POA: Diagnosis present

## 2021-04-06 DIAGNOSIS — Q513 Bicornate uterus: Secondary | ICD-10-CM | POA: Insufficient documentation

## 2021-04-07 NOTE — Progress Notes (Signed)
Anemia of pregnancy in the third trimester

## 2021-04-09 ENCOUNTER — Encounter: Payer: Self-pay | Admitting: Family Medicine

## 2021-04-11 ENCOUNTER — Other Ambulatory Visit: Payer: Self-pay

## 2021-04-11 ENCOUNTER — Encounter (HOSPITAL_COMMUNITY): Payer: Self-pay | Admitting: Obstetrics & Gynecology

## 2021-04-11 ENCOUNTER — Inpatient Hospital Stay (HOSPITAL_COMMUNITY)
Admission: AD | Admit: 2021-04-11 | Discharge: 2021-04-11 | Disposition: A | Discharge status: Home / Self Care | Payer: Medicaid Other | Attending: Obstetrics & Gynecology | Admitting: Obstetrics & Gynecology

## 2021-04-11 DIAGNOSIS — O30043 Twin pregnancy, dichorionic/diamniotic, third trimester: Secondary | ICD-10-CM | POA: Insufficient documentation

## 2021-04-11 DIAGNOSIS — Z3A34 34 weeks gestation of pregnancy: Secondary | ICD-10-CM | POA: Insufficient documentation

## 2021-04-11 DIAGNOSIS — O99613 Diseases of the digestive system complicating pregnancy, third trimester: Secondary | ICD-10-CM | POA: Insufficient documentation

## 2021-04-11 DIAGNOSIS — K439 Ventral hernia without obstruction or gangrene: Secondary | ICD-10-CM | POA: Diagnosis not present

## 2021-04-11 LAB — URINALYSIS, ROUTINE W REFLEX MICROSCOPIC
Bilirubin Urine: NEGATIVE
Glucose, UA: NEGATIVE mg/dL
Hgb urine dipstick: NEGATIVE
Ketones, ur: 80 mg/dL — AB
Nitrite: NEGATIVE
Protein, ur: 30 mg/dL — AB
Specific Gravity, Urine: 1.025 (ref 1.005–1.030)
pH: 6 (ref 5.0–8.0)

## 2021-04-11 NOTE — MAU Note (Signed)
..  Tonya Walls is a 31 y.o. at [redacted]w[redacted]d here in MAU reporting: Mid ABD swelling that is tender to the touch, and pain increase more internal when pt lays down. Pt reports started about two weeks ago, and increased in size since then. Pt reports feet swelling. Pt states she feel nauseous when she tries to eat, but with no emesis and is able to drink. Pt denies DFM, VB, LOF, fall or abd trauma recently, and abnormal discharge. Didi twin pregnancy.Pt has PreE in the pregnancy.   Pain score: 9/10 constant abd pain  Vitals:   04/11/21 2153 04/11/21 2154  BP:  137/73  Pulse:  (!) 104  Resp:  20  Temp:  98.2 F (36.8 C)  SpO2: 100%      FHT:145, 150 Lab orders placed from triage:  UA

## 2021-04-11 NOTE — MAU Provider Note (Signed)
History     CSN: [id number]  Arrival date and time: 04/11/21 2127   Event Date/Time   First Provider Initiated Contact with Patient 04/11/21 2230      Chief Complaint  Patient presents with   abdominal swelling   HPI  [name] is a 31 y.o. female G2P1001 @ 64w5dhere in MAU with complaints of abdominal pain and swelling.  She reports 2-3 weeks of umbilical pain that worsens with movement or with touching the area. She denies N/V or vaginal bleeding. She  has never had this pain before or with previous pregnancy. Sitting still makes the pain worse. If she presses on the area or changes positions the pain is 9/10. She has not tried anything for the pain.   OB History     Gravida  2   Para  1   Term  1   Preterm  0   AB  0   Living  1      SAB  0   IAB  0   Ectopic  0   Multiple  0   Live Births  1           Past Medical History:  Diagnosis Date   Avulsion fracture of tibial tuberosity    acute, left knee   Bicornuate uterus    Obesity, Class III, BMI 40-49.9 (morbid obesity) (HCC)    Wears glasses     Past Surgical History:  Procedure Laterality Date   OPEN REDUCTION INTERNAL FIXATION (ORIF) TIBIAL TUBERCLE Left 03/01/2018   Procedure: OPEN REDUCTION INTERNAL FIXATION (ORIF)LEFT  TIBIAL TUBERCLE;  Surgeon: VHiram Gash MD;  Location: [facility]  Service: Orthopedics;  Laterality: Left;   WISDOM TOOTH EXTRACTION      Family History  Problem Relation Age of Onset   Healthy Mother    Diabetes Father     Social History   Tobacco Use   Smoking status: Former    Types: Cigarettes    Quit date: 11/13/2012    Years since quitting: 8.4   Smokeless tobacco: Never  Vaping Use   Vaping Use: Never used  Substance Use Topics   Alcohol use: No   Drug use: No    Allergies: No Known Allergies  Medications Prior to Admission  Medication Sig Dispense Refill Last Dose   aspirin EC 81 MG tablet Take 1 tablet (81 mg total) by mouth daily. Take  after 12 weeks for prevention of preeclampsia later in pregnancy 300 tablet 2 04/11/2021   famotidine (PEPCID) 20 MG tablet Take 20 mg by mouth 2 (two) times daily.   04/11/2021   Prenatal Vit-Fe Fumarate-FA (PREPLUS) 27-1 MG TABS Take by mouth.   04/11/2021   Accu-Chek Softclix Lancets lancets Use four times daily as instructed. 100 each 12    Blood Glucose Monitoring Suppl (ACCU-CHEK GUIDE) w/Device KIT 1 Device by Does not apply route in the morning, at noon, in the evening, and at bedtime. 1 kit 0    glucose blood test strip Use as instructed 100 each 12    iron polysaccharides (NIFEREX) 150 MG capsule Take 1 capsule (150 mg total) by mouth every other day. (Patient not taking: Reported on 03/19/2021) 30 capsule 3    metoCLOPramide (REGLAN) 10 MG tablet Take 1 tablet (10 mg total) by mouth every 8 (eight) hours as needed for nausea. (Patient not taking: Reported on 03/05/2021) 30 tablet 1    No results found for this or any previous visit (  from the past 48 hour(s)).   Review of Systems  Gastrointestinal:  Positive for abdominal pain.  Genitourinary:  Positive for vaginal bleeding. Negative for vaginal discharge.  Physical Exam   Blood pressure 137/73, pulse (!) 104, temperature 98.2 F (36.8 C), temperature source Oral, resp. rate 20, height _0  (1.6 m), weight (!) 154.2 kg, last menstrual period 08/15/2020, SpO2 100 %.  Physical Exam Vitals and nursing note reviewed.  Constitutional:      General: She is not in acute distress.    Appearance: Normal appearance. She is not ill-appearing, toxic-appearing or diaphoretic.  Abdominal:     Palpations: Abdomen is soft.     Tenderness: There is abdominal tenderness in the periumbilical area.     Hernia: A hernia is present. Hernia is present in the umbilical area.  Neurological:     Mental Status: She is alert and oriented to person, place, and time.  Psychiatric:        Behavior: Behavior normal.   Fetal Tracing Fetus A Baseline: 140  bpm Variability: Moderate  Accelerations: 15x15 Decelerations: None Toco: UI  Fetal Tracing Fetus B Baseline: 150 bpm Variability: Moderate  Accelerations: 15x15 Decelerations: None    MAU Course  Procedures None  MDM  Dr. Roselie Walls came to MAU to see the patient. Symptoms consistent with umbilical hernia; reducible and no concern for incarceration.   Assessment and Plan   A:  Abdominal wall hernia - Plan: Discharge patient  [redacted] weeks gestation of pregnancy - Plan: Discharge patient  Dichorionic diamniotic twin pregnancy in third trimester - Plan: Discharge patient    P:  Discharge home Return to MAU if symptoms worsen Recommend establishing care with general surgery.   Tonya Lye, NP 04/15/2021 4:42 PM

## 2021-04-13 ENCOUNTER — Ambulatory Visit: Payer: Medicaid Other | Attending: Obstetrics and Gynecology

## 2021-04-13 ENCOUNTER — Encounter: Payer: Self-pay | Admitting: *Deleted

## 2021-04-13 ENCOUNTER — Other Ambulatory Visit: Payer: Self-pay

## 2021-04-13 ENCOUNTER — Ambulatory Visit: Payer: Medicaid Other | Admitting: *Deleted

## 2021-04-13 VITALS — BP 137/68 | HR 102

## 2021-04-13 DIAGNOSIS — O30043 Twin pregnancy, dichorionic/diamniotic, third trimester: Secondary | ICD-10-CM

## 2021-04-13 DIAGNOSIS — O0990 Supervision of high risk pregnancy, unspecified, unspecified trimester: Secondary | ICD-10-CM

## 2021-04-13 DIAGNOSIS — O133 Gestational [pregnancy-induced] hypertension without significant proteinuria, third trimester: Secondary | ICD-10-CM | POA: Diagnosis present

## 2021-04-13 DIAGNOSIS — O162 Unspecified maternal hypertension, second trimester: Secondary | ICD-10-CM

## 2021-04-13 DIAGNOSIS — O3403 Maternal care for unspecified congenital malformation of uterus, third trimester: Secondary | ICD-10-CM | POA: Diagnosis present

## 2021-04-13 DIAGNOSIS — Z3A34 34 weeks gestation of pregnancy: Secondary | ICD-10-CM

## 2021-04-13 DIAGNOSIS — Q513 Bicornate uterus: Secondary | ICD-10-CM | POA: Diagnosis present

## 2021-04-13 DIAGNOSIS — O99213 Obesity complicating pregnancy, third trimester: Secondary | ICD-10-CM | POA: Diagnosis present

## 2021-04-13 DIAGNOSIS — O24410 Gestational diabetes mellitus in pregnancy, diet controlled: Secondary | ICD-10-CM

## 2021-04-13 NOTE — Progress Notes (Signed)
PRENATAL VISIT NOTE  Subjective:  Tonya Walls is a 31 y.o. G2P1001 at [redacted]w[redacted]d being seen today for ongoing prenatal care.  She is currently monitored for the following issues for this high-risk pregnancy and has Bicornuate uterus affecting pregnancy, antepartum; Maternal morbid obesity, antepartum ([place]); Supervision of high risk pregnancy, antepartum; Anemia affecting pregnancy in third trimester; BMI 50.0-59.9, adult ([place]); Dizygotic twin pregnancy; Hypertension affecting pregnancy in second trimester; and Gestational diabetes mellitus, antepartum on their problem list.  Patient reports no complaints.  Contractions: Irritability. Vag. Bleeding: None.  Movement: Present. Denies leaking of fluid.   The following portions of the patient's history were reviewed and updated as appropriate: allergies, current medications, past family history, past medical history, past social history, past surgical history and problem list.   Objective:   Vitals:   04/15/21 1424  BP: 126/81  Pulse: (!) 106  Weight: (!) 337 lb (152.9 kg)    Fetal Status: Fetal Heart Rate (bpm): 140/144   Movement: Present   FH 48  General:  Alert, oriented and cooperative. Patient is in no acute distress.  Skin: Skin is warm and dry. No rash noted.   Cardiovascular: Normal heart rate noted  Respiratory: Normal respiratory effort, no problems with respiration noted  Abdomen: Soft, gravid, appropriate for gestational age.  Pain/Pressure: Absent     Pelvic: Cervical exam performed in the presence of a chaperone        Extremities: Normal range of motion.     Mental Status: Normal mood and affect. Normal behavior. Normal judgment and thought content.   Assessment and Plan:  Pregnancy: G2P1001 at 104w5d 1. Diet controlled gestational diabetes mellitus (GDM), antepartum - CBGs reviewed: log not brought again. She reports normal values from memory but she will bring her actual meter next week.  - See information on growth  below  2. Bicornuate uterus affecting pregnancy, antepartum  3. Maternal morbid obesity, antepartum ([place])  4. Supervision of high risk pregnancy, antepartum - GBS collected (and gc/ct)  5. Anemia affecting pregnancy in third trimester - Severe anemia - HgB is 8.8 and based on MCV likely c/w iron deficiency.  - She is taking her PO iron.  - She got venofer on 1/4, 1/18  6. Dizygotic twin pregnancy - By last scan on 1/23 they were vertex/breech. On 1/10: A is 3lb10z, 1658g B is 3lb15oz, 1786g. She has another growth on 2/8 (along with BPP and NST) - Discussed mode of delivery: Reviewed risk of c-section for second twin. She thinks she prefers attempt at SVD and will let us know if she changes her mind. Will schedule for IOL at 36w  7. Hypertension affecting pregnancy  - Presumed to be potential PreE w/o severe features vs undiagnosed cHTN.  - She has had no screening blood work since 25 weeks but has remained normotensive Bps in the office - We will recheck today labs and schedule for delivery at 36w per MFM recommendation given the previously elevated Bps and the proteinuria at 25w.  - Reviewed s/sx of Severe features.   Preterm labor symptoms and general obstetric precautions including but not limited to vaginal bleeding, contractions, leaking of fluid and fetal movement were reviewed in detail with the patient. Please refer to After Visit Summary for other counseling recommendations.   No follow-ups on file.  Future Appointments  Date Time Provider [place]  04/22/2021 10:00 AM [facility] NURSE [facility]  04/22/2021 10:15 AM W[facility]-M[facility] US2 W[facility]-M[facility]  04/29/2021 10:55 AM [place],  Juanita Craver, MD [facility]  05/06/2021  9:15 AM Radene Gunning, MD [facility]  05/13/2021  9:15 AM Osborne Oman, MD [facility]  05/20/2021  9:15 AM Radene Gunning, MD [facility]    Radene Gunning, MD

## 2021-04-15 ENCOUNTER — Telehealth (HOSPITAL_COMMUNITY): Payer: Self-pay | Admitting: *Deleted

## 2021-04-15 ENCOUNTER — Other Ambulatory Visit: Payer: Self-pay

## 2021-04-15 ENCOUNTER — Other Ambulatory Visit: Payer: Self-pay | Admitting: Advanced Practice Midwife

## 2021-04-15 ENCOUNTER — Encounter (HOSPITAL_COMMUNITY): Payer: Self-pay | Admitting: *Deleted

## 2021-04-15 ENCOUNTER — Other Ambulatory Visit (HOSPITAL_COMMUNITY)
Admission: RE | Admit: 2021-04-15 | Discharge: 2021-04-15 | Disposition: A | Discharge status: Home / Self Care | Payer: Medicaid Other | Source: Ambulatory Visit | Attending: Obstetrics and Gynecology | Admitting: Obstetrics and Gynecology

## 2021-04-15 ENCOUNTER — Ambulatory Visit (INDEPENDENT_AMBULATORY_CARE_PROVIDER_SITE_OTHER): Payer: Medicaid Other | Admitting: Obstetrics and Gynecology

## 2021-04-15 ENCOUNTER — Encounter (HOSPITAL_COMMUNITY): Payer: Self-pay

## 2021-04-15 VITALS — BP 126/81 | HR 106 | Wt 337.0 lb

## 2021-04-15 DIAGNOSIS — O99013 Anemia complicating pregnancy, third trimester: Secondary | ICD-10-CM

## 2021-04-15 DIAGNOSIS — O30049 Twin pregnancy, dichorionic/diamniotic, unspecified trimester: Secondary | ICD-10-CM

## 2021-04-15 DIAGNOSIS — O0990 Supervision of high risk pregnancy, unspecified, unspecified trimester: Secondary | ICD-10-CM | POA: Diagnosis not present

## 2021-04-15 DIAGNOSIS — O162 Unspecified maternal hypertension, second trimester: Secondary | ICD-10-CM

## 2021-04-15 DIAGNOSIS — Q513 Bicornate uterus: Secondary | ICD-10-CM

## 2021-04-15 DIAGNOSIS — O24410 Gestational diabetes mellitus in pregnancy, diet controlled: Secondary | ICD-10-CM

## 2021-04-15 DIAGNOSIS — O99210 Obesity complicating pregnancy, unspecified trimester: Secondary | ICD-10-CM

## 2021-04-15 DIAGNOSIS — O3400 Maternal care for unspecified congenital malformation of uterus, unspecified trimester: Secondary | ICD-10-CM

## 2021-04-15 LAB — OB RESULTS CONSOLE GC/CHLAMYDIA: Gonorrhea: NEGATIVE

## 2021-04-15 NOTE — Telephone Encounter (Signed)
Preadmission screen  

## 2021-04-16 LAB — COMPREHENSIVE METABOLIC PANEL
ALT: 13 IU/L (ref 0–32)
AST: 25 IU/L (ref 0–40)
Albumin/Globulin Ratio: 1.2 (ref 1.2–2.2)
Albumin: 3.4 g/dL — ABNORMAL LOW (ref 3.9–5.0)
Alkaline Phosphatase: 146 IU/L — ABNORMAL HIGH (ref 44–121)
BUN/Creatinine Ratio: 16 (ref 9–23)
BUN: 8 mg/dL (ref 6–20)
Bilirubin Total: 0.4 mg/dL (ref 0.0–1.2)
CO2: 17 mmol/L — ABNORMAL LOW (ref 20–29)
Calcium: 9.2 mg/dL (ref 8.7–10.2)
Chloride: 105 mmol/L (ref 96–106)
Creatinine, Ser: 0.5 mg/dL — ABNORMAL LOW (ref 0.57–1.00)
Globulin, Total: 2.8 g/dL (ref 1.5–4.5)
Glucose: 109 mg/dL — ABNORMAL HIGH (ref 70–99)
Potassium: 4 mmol/L (ref 3.5–5.2)
Sodium: 136 mmol/L (ref 134–144)
Total Protein: 6.2 g/dL (ref 6.0–8.5)
eGFR: 131 mL/min/{1.73_m2} (ref >59)

## 2021-04-16 LAB — CBC
Hematocrit: 32.1 % — ABNORMAL LOW (ref 34.0–46.6)
Hemoglobin: 10.2 g/dL — ABNORMAL LOW (ref 11.1–15.9)
MCH: 25.8 pg — ABNORMAL LOW (ref 26.6–33.0)
MCHC: 31.8 g/dL (ref 31.5–35.7)
MCV: 81 fL (ref 79–97)
Platelets: 227 10*3/uL (ref 150–450)
RBC: 3.95 x10E6/uL (ref 3.77–5.28)
RDW: 22.8 % — ABNORMAL HIGH (ref 11.7–15.4)
WBC: 5.3 10*3/uL (ref 3.4–10.8)

## 2021-04-16 LAB — CERVICOVAGINAL ANCILLARY ONLY
Chlamydia: NEGATIVE
Comment: NEGATIVE
Comment: NORMAL
Neisseria Gonorrhea: NEGATIVE

## 2021-04-19 ENCOUNTER — Other Ambulatory Visit: Payer: Self-pay | Admitting: Advanced Practice Midwife

## 2021-04-19 LAB — CULTURE, BETA STREP (GROUP B ONLY): Strep Gp B Culture: NEGATIVE

## 2021-04-21 ENCOUNTER — Other Ambulatory Visit (HOSPITAL_COMMUNITY): Payer: Self-pay | Admitting: *Deleted

## 2021-04-21 ENCOUNTER — Encounter (HOSPITAL_COMMUNITY): Payer: Self-pay | Admitting: *Deleted

## 2021-04-21 ENCOUNTER — Encounter: Payer: Self-pay | Admitting: Obstetrics and Gynecology

## 2021-04-21 DIAGNOSIS — O162 Unspecified maternal hypertension, second trimester: Secondary | ICD-10-CM

## 2021-04-21 DIAGNOSIS — O0990 Supervision of high risk pregnancy, unspecified, unspecified trimester: Secondary | ICD-10-CM

## 2021-04-21 DIAGNOSIS — O24410 Gestational diabetes mellitus in pregnancy, diet controlled: Secondary | ICD-10-CM

## 2021-04-22 ENCOUNTER — Ambulatory Visit: Payer: Medicaid Other | Admitting: *Deleted

## 2021-04-22 ENCOUNTER — Other Ambulatory Visit: Payer: Self-pay | Admitting: Obstetrics and Gynecology

## 2021-04-22 ENCOUNTER — Encounter: Payer: Self-pay | Admitting: *Deleted

## 2021-04-22 ENCOUNTER — Ambulatory Visit: Payer: Medicaid Other | Attending: Obstetrics and Gynecology

## 2021-04-22 ENCOUNTER — Other Ambulatory Visit: Payer: Self-pay

## 2021-04-22 VITALS — BP 121/74 | HR 102

## 2021-04-22 DIAGNOSIS — O30043 Twin pregnancy, dichorionic/diamniotic, third trimester: Secondary | ICD-10-CM | POA: Diagnosis present

## 2021-04-22 DIAGNOSIS — O0990 Supervision of high risk pregnancy, unspecified, unspecified trimester: Secondary | ICD-10-CM | POA: Diagnosis present

## 2021-04-22 DIAGNOSIS — O162 Unspecified maternal hypertension, second trimester: Secondary | ICD-10-CM

## 2021-04-22 DIAGNOSIS — Q513 Bicornate uterus: Secondary | ICD-10-CM

## 2021-04-22 DIAGNOSIS — O3403 Maternal care for unspecified congenital malformation of uterus, third trimester: Secondary | ICD-10-CM | POA: Diagnosis present

## 2021-04-22 DIAGNOSIS — O99213 Obesity complicating pregnancy, third trimester: Secondary | ICD-10-CM | POA: Diagnosis present

## 2021-04-22 DIAGNOSIS — O133 Gestational [pregnancy-induced] hypertension without significant proteinuria, third trimester: Secondary | ICD-10-CM

## 2021-04-22 DIAGNOSIS — O24410 Gestational diabetes mellitus in pregnancy, diet controlled: Secondary | ICD-10-CM | POA: Diagnosis present

## 2021-04-23 ENCOUNTER — Ambulatory Visit (INDEPENDENT_AMBULATORY_CARE_PROVIDER_SITE_OTHER): Payer: Medicaid Other | Admitting: Obstetrics & Gynecology

## 2021-04-23 ENCOUNTER — Encounter: Payer: Self-pay | Admitting: Obstetrics & Gynecology

## 2021-04-23 VITALS — BP 128/84 | HR 108 | Wt 334.3 lb

## 2021-04-23 DIAGNOSIS — O0990 Supervision of high risk pregnancy, unspecified, unspecified trimester: Secondary | ICD-10-CM

## 2021-04-23 DIAGNOSIS — O24419 Gestational diabetes mellitus in pregnancy, unspecified control: Secondary | ICD-10-CM

## 2021-04-23 DIAGNOSIS — Z3A35 35 weeks gestation of pregnancy: Secondary | ICD-10-CM

## 2021-04-23 DIAGNOSIS — O163 Unspecified maternal hypertension, third trimester: Secondary | ICD-10-CM

## 2021-04-23 DIAGNOSIS — O30049 Twin pregnancy, dichorionic/diamniotic, unspecified trimester: Secondary | ICD-10-CM

## 2021-04-23 LAB — SARS CORONAVIRUS 2 (TAT 6-24 HRS): SARS Coronavirus 2: NEGATIVE

## 2021-04-23 NOTE — Progress Notes (Signed)
° °  PRENATAL VISIT NOTE  Subjective:  Tonya Walls is a 31 y.o. G2P1001 at [redacted]w[redacted]d being seen today for ongoing prenatal care.  She is currently monitored for the following issues for this high-risk pregnancy and has Bicornuate uterus affecting pregnancy, antepartum; Maternal morbid obesity, antepartum ([place]); Supervision of high risk pregnancy, antepartum; Anemia affecting pregnancy in third trimester; BMI 50.0-59.9, adult ([place]); Dizygotic twin pregnancy; Hypertension affecting pregnancy in second trimester; and Gestational diabetes mellitus, antepartum on their problem list.  Patient reports no complaints.  Contractions: Irregular. Vag. Bleeding: None.  Movement: Present. Denies leaking of fluid.   The following portions of the patient's history were reviewed and updated as appropriate: allergies, current medications, past family history, past medical history, past social history, past surgical history and problem list.   Objective:   Vitals:   04/23/21 0900  BP: 128/84  Pulse: (!) 108  Weight: (!) 334 lb 4.8 oz (151.6 kg)    Fetal Status: Fetal Heart Rate (bpm): 155/160   Movement: Present     General:  Alert, oriented and cooperative. Patient is in no acute distress.  Skin: Skin is warm and dry. No rash noted.   Cardiovascular: Normal heart rate noted  Respiratory: Normal respiratory effort, no problems with respiration noted  Abdomen: Soft, gravid, appropriate for gestational age.  Pain/Pressure: Present     Pelvic: Cervical exam deferred        Extremities: Normal range of motion.  Edema: Trace  Mental Status: Normal mood and affect. Normal behavior. Normal judgment and thought content.   Assessment and Plan:  Pregnancy: G2P1001 at [redacted]w[redacted]d 1. Gestational diabetes mellitus (GDM) in third trimester, gestational diabetes method of control unspecified Stable blood sugars  2. Hypertension affecting pregnancy in third trimester Stable BP  3. Dizygotic twin pregnancy 4. [redacted] weeks  gestation of pregnancy 5. Supervision of high risk pregnancy, antepartum Induction of labor scheduled for 04/24/2021 tomorrow. Labor symptoms and general obstetric precautions including but not limited to vaginal bleeding, contractions, leaking of fluid and fetal movement were reviewed in detail with the patient. Please refer to After Visit Summary for other counseling recommendations.   Return for Postpartum check, being induced 04/24/2021.  Future Appointments  Date Time Provider Home  04/24/2021  6:45 AM MC-LD SCHED ROOM [facility]-INDC None    Verita Schneiders, MD

## 2021-04-23 NOTE — Progress Notes (Signed)
Patient in for routine prenatal. Reports good fetal movement, states sugars having been running good. Fasting sugars are typically 80-90, 1 hour 130-140's. No issues or concerns at this time   Tonya Walls, [place]

## 2021-04-24 ENCOUNTER — Inpatient Hospital Stay (HOSPITAL_COMMUNITY): Payer: Medicaid Other | Admitting: Anesthesiology

## 2021-04-24 ENCOUNTER — Other Ambulatory Visit: Payer: Self-pay

## 2021-04-24 ENCOUNTER — Inpatient Hospital Stay (HOSPITAL_COMMUNITY): Payer: Medicaid Other

## 2021-04-24 ENCOUNTER — Inpatient Hospital Stay (HOSPITAL_COMMUNITY)
Admission: AD | Admit: 2021-04-24 | Discharge: 2021-04-26 | DRG: 806 | Disposition: A | Discharge status: Home / Self Care | Payer: Medicaid Other | Attending: Obstetrics & Gynecology | Admitting: Obstetrics & Gynecology

## 2021-04-24 ENCOUNTER — Encounter (HOSPITAL_COMMUNITY): Payer: Self-pay | Admitting: Family Medicine

## 2021-04-24 DIAGNOSIS — Z87891 Personal history of nicotine dependence: Secondary | ICD-10-CM

## 2021-04-24 DIAGNOSIS — O9081 Anemia of the puerperium: Secondary | ICD-10-CM | POA: Diagnosis not present

## 2021-04-24 DIAGNOSIS — Q513 Bicornate uterus: Secondary | ICD-10-CM

## 2021-04-24 DIAGNOSIS — O30049 Twin pregnancy, dichorionic/diamniotic, unspecified trimester: Secondary | ICD-10-CM | POA: Diagnosis present

## 2021-04-24 DIAGNOSIS — Z3A36 36 weeks gestation of pregnancy: Secondary | ICD-10-CM

## 2021-04-24 DIAGNOSIS — O24419 Gestational diabetes mellitus in pregnancy, unspecified control: Secondary | ICD-10-CM | POA: Diagnosis present

## 2021-04-24 DIAGNOSIS — Z6841 Body Mass Index (BMI) 40.0 and over, adult: Secondary | ICD-10-CM

## 2021-04-24 DIAGNOSIS — Z8632 Personal history of gestational diabetes: Secondary | ICD-10-CM | POA: Diagnosis present

## 2021-04-24 DIAGNOSIS — O30043 Twin pregnancy, dichorionic/diamniotic, third trimester: Secondary | ICD-10-CM | POA: Diagnosis present

## 2021-04-24 DIAGNOSIS — O3403 Maternal care for unspecified congenital malformation of uterus, third trimester: Secondary | ICD-10-CM | POA: Diagnosis present

## 2021-04-24 DIAGNOSIS — O24420 Gestational diabetes mellitus in childbirth, diet controlled: Principal | ICD-10-CM | POA: Diagnosis present

## 2021-04-24 DIAGNOSIS — D62 Acute posthemorrhagic anemia: Secondary | ICD-10-CM | POA: Diagnosis not present

## 2021-04-24 DIAGNOSIS — O4202 Full-term premature rupture of membranes, onset of labor within 24 hours of rupture: Secondary | ICD-10-CM | POA: Diagnosis not present

## 2021-04-24 DIAGNOSIS — O114 Pre-existing hypertension with pre-eclampsia, complicating childbirth: Secondary | ICD-10-CM | POA: Diagnosis present

## 2021-04-24 DIAGNOSIS — O1002 Pre-existing essential hypertension complicating childbirth: Secondary | ICD-10-CM | POA: Diagnosis present

## 2021-04-24 DIAGNOSIS — O99214 Obesity complicating childbirth: Secondary | ICD-10-CM | POA: Diagnosis present

## 2021-04-24 DIAGNOSIS — O3400 Maternal care for unspecified congenital malformation of uterus, unspecified trimester: Secondary | ICD-10-CM

## 2021-04-24 DIAGNOSIS — O99013 Anemia complicating pregnancy, third trimester: Secondary | ICD-10-CM | POA: Diagnosis present

## 2021-04-24 DIAGNOSIS — O0990 Supervision of high risk pregnancy, unspecified, unspecified trimester: Secondary | ICD-10-CM

## 2021-04-24 DIAGNOSIS — O1414 Severe pre-eclampsia complicating childbirth: Secondary | ICD-10-CM | POA: Diagnosis not present

## 2021-04-24 DIAGNOSIS — O99210 Obesity complicating pregnancy, unspecified trimester: Secondary | ICD-10-CM | POA: Diagnosis present

## 2021-04-24 DIAGNOSIS — O162 Unspecified maternal hypertension, second trimester: Secondary | ICD-10-CM | POA: Diagnosis present

## 2021-04-24 DIAGNOSIS — Z8759 Personal history of other complications of pregnancy, childbirth and the puerperium: Secondary | ICD-10-CM | POA: Diagnosis present

## 2021-04-24 LAB — COMPREHENSIVE METABOLIC PANEL
ALT: 20 U/L (ref 0–44)
AST: 31 U/L (ref 15–41)
Albumin: 2.5 g/dL — ABNORMAL LOW (ref 3.5–5.0)
Alkaline Phosphatase: 136 U/L — ABNORMAL HIGH (ref 38–126)
Anion gap: 11 (ref 5–15)
BUN: 9 mg/dL (ref 6–20)
CO2: 18 mmol/L — ABNORMAL LOW (ref 22–32)
Calcium: 8.8 mg/dL — ABNORMAL LOW (ref 8.9–10.3)
Chloride: 107 mmol/L (ref 98–111)
Creatinine, Ser: 0.56 mg/dL (ref 0.44–1.00)
GFR, Estimated: >60 mL/min (ref >60)
Glucose, Bld: 88 mg/dL (ref 70–99)
Potassium: 3.7 mmol/L (ref 3.5–5.1)
Sodium: 136 mmol/L (ref 135–145)
Total Bilirubin: 0.6 mg/dL (ref 0.3–1.2)
Total Protein: 6.5 g/dL (ref 6.5–8.1)

## 2021-04-24 LAB — CBC
HCT: 35.3 % — ABNORMAL LOW (ref 36.0–46.0)
HCT: 33.5 % — ABNORMAL LOW (ref 36.0–46.0)
Hemoglobin: 10.7 g/dL — ABNORMAL LOW (ref 12.0–15.0)
Hemoglobin: 10.4 g/dL — ABNORMAL LOW (ref 12.0–15.0)
MCH: 26.4 pg (ref 26.0–34.0)
MCH: 26.7 pg (ref 26.0–34.0)
MCHC: 30.3 g/dL (ref 30.0–36.0)
MCHC: 31 g/dL (ref 30.0–36.0)
MCV: 87.2 fL (ref 80.0–100.0)
MCV: 86.1 fL (ref 80.0–100.0)
Platelets: 290 10*3/uL (ref 150–400)
Platelets: 249 10*3/uL (ref 150–400)
RBC: 4.05 MIL/uL (ref 3.87–5.11)
RBC: 3.89 MIL/uL (ref 3.87–5.11)
RDW: 24.1 % — ABNORMAL HIGH (ref 11.5–15.5)
RDW: 23.9 % — ABNORMAL HIGH (ref 11.5–15.5)
WBC: 5.5 10*3/uL (ref 4.0–10.5)
WBC: 4.7 10*3/uL (ref 4.0–10.5)
nRBC: 0 % (ref 0.0–0.2)
nRBC: 0 % (ref 0.0–0.2)

## 2021-04-24 LAB — TYPE AND SCREEN
ABO/RH(D): O POS
Antibody Screen: NEGATIVE

## 2021-04-24 LAB — URINALYSIS, ROUTINE W REFLEX MICROSCOPIC
Bilirubin Urine: NEGATIVE
Glucose, UA: NEGATIVE mg/dL
Hgb urine dipstick: NEGATIVE
Ketones, ur: 20 mg/dL — AB
Leukocytes,Ua: NEGATIVE
Nitrite: NEGATIVE
Protein, ur: 30 mg/dL — AB
Specific Gravity, Urine: 1.027 (ref 1.005–1.030)
pH: 6 (ref 5.0–8.0)

## 2021-04-24 LAB — PROTEIN / CREATININE RATIO, URINE
Creatinine, Urine: 314.02 mg/dL
Protein Creatinine Ratio: 0.17 mg/mg{Cre} — ABNORMAL HIGH (ref 0.00–0.15)
Total Protein, Urine: 54 mg/dL

## 2021-04-24 LAB — RPR: RPR Ser Ql: NONREACTIVE

## 2021-04-24 MED ORDER — OXYTOCIN-SODIUM CHLORIDE 30-0.9 UT/500ML-% IV SOLN
2.5000 [IU]/h | INTRAVENOUS | Status: DC
  Administered 2021-04-24: 2.5 [IU]/h via INTRAVENOUS
  Filled 2021-04-24: qty 500

## 2021-04-24 MED ORDER — SOD CITRATE-CITRIC ACID 500-334 MG/5ML PO SOLN: 30.0000 mL | ORAL | Status: DC | PRN

## 2021-04-24 MED ORDER — LIDOCAINE HCL (PF) 1 % IJ SOLN: 30.0000 mL | INTRAMUSCULAR | Status: DC | PRN

## 2021-04-24 MED ORDER — LIDOCAINE HCL (PF) 1 % IJ SOLN
INTRAMUSCULAR | Status: DC | PRN
  Administered 2021-04-24 (×2): 5 mL via EPIDURAL

## 2021-04-24 MED ORDER — TERBUTALINE SULFATE 1 MG/ML IJ SOLN: 0.2500 mg | Freq: Once | INTRAMUSCULAR | Status: DC | PRN

## 2021-04-24 MED ORDER — OXYTOCIN-SODIUM CHLORIDE 30-0.9 UT/500ML-% IV SOLN
1.0000 m[IU]/min | INTRAVENOUS | Status: DC
  Administered 2021-04-24: 2 m[IU]/min via INTRAVENOUS

## 2021-04-24 MED ORDER — OXYTOCIN BOLUS FROM INFUSION
333.0000 mL | Freq: Once | INTRAVENOUS | Status: AC
  Administered 2021-04-24: 333 mL via INTRAVENOUS

## 2021-04-24 MED ORDER — LACTATED RINGERS IV SOLN: 500.0000 mL | INTRAVENOUS | Status: DC | PRN

## 2021-04-24 MED ORDER — OXYCODONE-ACETAMINOPHEN 5-325 MG PO TABS: 2.0000 | ORAL_TABLET | ORAL | Status: DC | PRN

## 2021-04-24 MED ORDER — DIPHENHYDRAMINE HCL 50 MG/ML IJ SOLN: 12.5000 mg | INTRAMUSCULAR | Status: DC | PRN

## 2021-04-24 MED ORDER — TRANEXAMIC ACID-NACL 1000-0.7 MG/100ML-% IV SOLN
INTRAVENOUS | Status: AC
  Administered 2021-04-24: 1000 mg
  Filled 2021-04-24: qty 100

## 2021-04-24 MED ORDER — FLEET ENEMA 7-19 GM/118ML RE ENEM: 1.0000 | ENEMA | RECTAL | Status: DC | PRN

## 2021-04-24 MED ORDER — LACTATED RINGERS IV SOLN: INTRAVENOUS | Status: DC

## 2021-04-24 MED ORDER — ONDANSETRON HCL 4 MG/2ML IJ SOLN: 4.0000 mg | Freq: Four times a day (QID) | INTRAMUSCULAR | Status: DC | PRN

## 2021-04-24 MED ORDER — ACETAMINOPHEN 325 MG PO TABS: 650.0000 mg | ORAL_TABLET | ORAL | Status: DC | PRN

## 2021-04-24 MED ORDER — LACTATED RINGERS IV SOLN: 500.0000 mL | Freq: Once | INTRAVENOUS | Status: DC

## 2021-04-24 MED ORDER — FENTANYL-BUPIVACAINE-NACL 0.5-0.125-0.9 MG/250ML-% EP SOLN
12.0000 mL/h | EPIDURAL | Status: DC | PRN
  Filled 2021-04-24: qty 250

## 2021-04-24 MED ORDER — PHENYLEPHRINE 40 MCG/ML (10ML) SYRINGE FOR IV PUSH (FOR BLOOD PRESSURE SUPPORT)
80.0000 ug | PREFILLED_SYRINGE | INTRAVENOUS | Status: DC | PRN
  Administered 2021-04-24: 80 ug via INTRAVENOUS

## 2021-04-24 MED ORDER — PHENYLEPHRINE 40 MCG/ML (10ML) SYRINGE FOR IV PUSH (FOR BLOOD PRESSURE SUPPORT)
80.0000 ug | PREFILLED_SYRINGE | INTRAVENOUS | Status: DC | PRN
  Filled 2021-04-24: qty 10

## 2021-04-24 MED ORDER — FENTANYL CITRATE (PF) 100 MCG/2ML IJ SOLN
50.0000 ug | INTRAMUSCULAR | Status: DC | PRN
  Administered 2021-04-24: 100 ug via INTRAVENOUS
  Administered 2021-04-24: 11 ug via INTRAVENOUS
  Filled 2021-04-24: qty 2

## 2021-04-24 MED ORDER — EPHEDRINE 5 MG/ML INJ: 10.0000 mg | INTRAVENOUS | Status: DC | PRN

## 2021-04-24 MED ORDER — OXYCODONE-ACETAMINOPHEN 5-325 MG PO TABS: 1.0000 | ORAL_TABLET | ORAL | Status: DC | PRN

## 2021-04-24 MED ORDER — MISOPROSTOL 50MCG HALF TABLET
50.0000 ug | ORAL_TABLET | ORAL | Status: DC | PRN
  Administered 2021-04-24 (×2): 50 ug via BUCCAL
  Filled 2021-04-24 (×2): qty 1

## 2021-04-24 NOTE — Progress Notes (Signed)
Post Partum Day 1 Subjective: Doing well. No acute events overnight. Pain is controlled and bleeding is appropriate. She is eating, drinking, voiding, and ambulating without issue. She is formula feeding which is going well. She has no other concerns at this time.  Objective: Blood pressure 120/65, pulse 100, temperature 98.6 F (37 C), temperature source Oral, resp. rate 17, height 5\' 3"  (1.6 m), weight (!) 151 kg, last menstrual period 08/15/2020, SpO2 100 %.  Physical Exam:  General: alert, cooperative, and no distress Lochia: appropriate Uterine Fundus: firm and below umbilicus  DVT Evaluation: no LE edema or calf tenderness to palpation   Recent Labs    04/24/21 0812 04/24/21 1512  HGB 10.7* 10.4*  HCT 35.3* 33.5*    Assessment/Plan: Tonya Walls is a 31 y.o. 29 on PPD# 1 s/p SVD.  Progressing well. Meeting postpartum milestones. VSS. Continue routine postpartum care.  #Pre-eclampsia without severe features: - BP stable postpartum - No symptoms  - Will continue to monitor  #A1GDM:  - Plan for fasting CBG on PPD#2  Feeding: Formula  Contraception: Undecided, considering IUD, has bicornuate uterus, will need to discuss further  Circumcision: Desires, consented at bedside this AM  Dispo: Plan for discharge on PPD#2.   LOS: 0 days   [id number], MD  04/24/2021, 11:10 PM

## 2021-04-24 NOTE — Progress Notes (Addendum)
Labor Progress Note Tonya Walls is a 31 y.o. G2P1001 at [redacted]w[redacted]d presented for IOL for di/di twin w A1GDM and cHTN  S:  Pt has been resting in bed, spoke to RN after cervical exam. Still not feeling contractions very much, FOB remains at bedside.  O:  BP 105/62    Pulse (!) 108    Temp 98.6 F (37 C) (Oral)    Resp 18    Ht 5\' 3"  (1.6 m)    Wt (!) 332 lb 14.3 oz (151 kg)    LMP 08/15/2020 (Exact Date)    BMI 58.97 kg/m  EFM: Baby A: baseline 150 bpm/ moderate variability/ 15x15 accels/ no decels  Baby B: baseline 130bpm/ moderate variability/ 15x15 accels/ no decels Toco/IUPC: UI SVE: Fingertip/50/-2 Pitocin: none yet  A/P: 30 y.o. G2P1001 [redacted]w[redacted]d  1. Labor: Latent 2. FWB: Cat 1 3. Pain: only discomfort, well managed with continuous FOB support 4. cHTN: labs and BP stable 5. A1GDM: 88 on admission  2nd dose of buccal cytotec given. Still not ripe enough for FB. Will assess at next exam. Anticipate SVD.  [redacted]w[redacted]d, CNM, MSN, IBCLC Certified Nurse Midwife, [facility]

## 2021-04-24 NOTE — Progress Notes (Addendum)
Tonya Walls is a 31 y.o. G2P1001 at 42w0dby LMP admitted for induction of labor. She met criteria for cHTN, Di/Di twins and A1DM.   Subjective: Pt reports she is doing well, no pain with epidural.  She has no questions about plans for pitocin and FSE.   Objective: Pt resting in bed, husband at bedside. NAD.  BP 116/69    Pulse 98    Temp 98.6 F (37 C) (Oral)    Resp 18    Ht 5' 3" (1.6 m)    Wt (!) 151 kg    LMP 08/15/2020 (Exact Date)    BMI 58.97 kg/m  No intake/output data recorded. No intake/output data recorded.  FHT:  A: FHR: 140's bpm, variability: moderate,  accelerations:  Present,  decelerations:  Absent FHR: B: 140's bpm, variability: moderate,  accelerations:  Present,  decelerations:  Absent UC:   regular, every 3-4 minutes, 60-90 secs SVE:   Dilation: 3 Effacement (%): 50 Station: -2 Exam by:: walker, CNM  Labs: Lab Results  Component Value Date   WBC 4.7 04/24/2021   HGB 10.4 (L) 04/24/2021   HCT 33.5 (L) 04/24/2021   MCV 86.1 04/24/2021   PLT 249 04/24/2021    Assessment / Plan: #IOL d/t  cHTN, Di/Di twins and A1DM.  Progressing well. S/P cytotec x 2 doses.  SROM at 1645 (moderate mec noted). Discussed R/B of pitocin, pt gave verbal consent. Start low dose pitocin per protocol.  Will continue to monitor.  Dicussed FSE for baby A.  FSE applied without difficulty.  Preeclampsia:  no signs or symptoms of toxicity and labs stable A1DM: glucose 88 on admission Fetal Wellbeing:  Category I Pain Control:  Epidural I/D:   GBS neg Anticipated MOD:  NSVD  Cottrell Gentles 04/24/2021, 5:58 PM

## 2021-04-24 NOTE — Discharge Summary (Signed)
Postpartum Discharge Summary    Patient Name: Tonya Walls DOB: July 01, 1990 MRN: [id number]  Date of admission: 04/24/2021 Delivery date:   Anjolie, Majer [[id number]]  04/24/2021    Savana, Spina [[id number]]  04/24/2021  Delivering provider:    Anyely, Cunning [[id number]]  Trainer, [place], [place] [[id number]]  Vilma Meckel M  Date of discharge: 04/26/2021  Admitting diagnosis: Twin gestation, dichorionic diamniotic [O30.049] Intrauterine pregnancy: [redacted]w[redacted]d     Secondary diagnosis:  Principal Problem:   Vaginal delivery Active Problems:   Bicornuate uterus affecting pregnancy, antepartum   Maternal morbid obesity, antepartum ([place])   Supervision of high risk pregnancy, antepartum   Anemia affecting pregnancy in third trimester   BMI 50.0-59.9, adult ([place])   Dizygotic twin pregnancy   Hypertension affecting pregnancy in second trimester   Gestational diabetes mellitus, antepartum   Twin gestation, dichorionic diamniotic  Additional problems: ?cHTN vs PEC   Discharge diagnosis: Preterm Pregnancy Delivered                                              Post partum procedures: none Augmentation: AROM, Pitocin, and Cytotec Complications: None  Hospital course: Induction of Labor With Vaginal Delivery   31 y.o. yo G2P1001 at [redacted]w[redacted]d was admitted to the hospital 04/24/2021 for induction of labor.  Indication for induction: Preeclampsia, A1 DM, and Di/Di twin gestation .  Patient had an uncomplicated labor course and vaginal delivery. Membrane Rupture Time/Date:    Anjani, Feuerborn [[id number]]  9:16 PM    Oria, Klimas [[id number]]  9:16 PM ,   Nica, Friske [[id number]]  04/24/2021    Daira, Hine [[id number]]  04/24/2021   Delivery Method:   Jacquelin Hawking [[id number]]  Vaginal, Spontaneous    Offie, Pickron [[id number]]  Vaginal, Spontaneous  Episiotomy:    Cortana, Vanderford [[id number]]  None    Mileigh, Tilley  Kadie [[id number]]  None  Lacerations:     Tunya, Held [[id number]]  2nd degree;Perineal    Brilyn, Tuller [[id number]]  2nd degree;Perineal  Details of delivery can be found in separate delivery note.  Patient had a routine postpartum course. Patient is discharged home 04/26/21.  Newborn Data: Birth date:   Emanuella, Nickle [[id number]]  04/24/2021    Bria, Sparr [[id number]]  04/24/2021  Birth time:   Hannia, Matchett [[id number]]  9:10 PM    Saidy, Ormand [[id number]]  9:39 PM  Gender:   Huntley, Demedeiros [[id number]]  Female    Danniella, Robben [[id number]]  Female  Living status:   Makai, Agostinelli [[id number]]  Living    Cadee, Agro [[id number]]  Living  Apgars:   Lavada, Langsam [[id number]]  [address] [[id number]]  9 ,   Aneli, Zara [[id number]]  [address] Riki [[id number]]  9  Weight:   Parneet, Glantz [[id number]]  1856 g    Jniya, Madara Pihu [[id number]]  2700 g   Magnesium Sulfate received: No BMZ received: No Rhophylac: N/A MMR: N/A T-DaP: Offered postpartum  Flu: Offered postpartum  Transfusion: No   Physical exam  Vitals:   04/25/21 1438 04/25/21 1627 04/25/21 2057 04/26/21 0524  BP: 130/86 128/76 (!) 120/50 110/73  Pulse: (!) 105  100 98  Resp: $Remo'17  20 20  'YYBjM$ Temp: 98.8 F (37.1 C)  98.6 F (37 C) 98.7 F (37.1 C)  TempSrc: Oral  Oral Oral  SpO2:   100% 99%  Weight:      Height:       General: alert, cooperative, and no distress Lochia: appropriate Uterine Fundus: firm Incision: N/A DVT Evaluation: No evidence of DVT seen on physical exam. Negative Homan's sign. No cords or calf tenderness. No significant calf/ankle edema.  Labs: Lab Results  Component Value Date   WBC 4.7 04/24/2021   HGB 10.4 (L) 04/24/2021   HCT 33.5 (L) 04/24/2021   MCV 86.1 04/24/2021   PLT 249 04/24/2021   CMP Latest Ref Rng & Units 04/24/2021  Glucose 70 - 99 mg/dL 88  BUN 6  - 20 mg/dL 9  Creatinine 0.44 - 1.00 mg/dL 0.56  Sodium 135 - 145 mmol/L 136  Potassium 3.5 - 5.1 mmol/L 3.7  Chloride 98 - 111 mmol/L 107  CO2 22 - 32 mmol/L 18(L)  Calcium 8.9 - 10.3 mg/dL 8.8(L)  Total Protein 6.5 - 8.1 g/dL 6.5  Total Bilirubin 0.3 - 1.2 mg/dL 0.6  Alkaline Phos 38 - 126 U/L 136(H)  AST 15 - 41 U/L 31  ALT 0 - 44 U/L 20   Edinburgh Score: Edinburgh Postnatal Depression Scale Screening Tool 04/25/2021  I have been able to laugh and see the funny side of things. 0  I have looked forward with enjoyment to things. 0  I have blamed myself unnecessarily when things went wrong. 0  I have been anxious or worried for no good reason. 3  I have felt scared or panicky for no good reason. 0  Things have been getting on top of me. 0  I have been so unhappy that I have had difficulty sleeping. 0  I have felt sad or miserable. 0  I have been so unhappy that I have been crying. 0  The thought of harming myself has occurred to me. 0  Edinburgh Postnatal Depression Scale Total 3     After visit meds:  Allergies as of 04/26/2021   No Known Allergies      Medication List     STOP taking these medications    Accu-Chek Guide w/Device Kit   Accu-Chek Softclix Lancets lancets   aspirin EC 81 MG tablet   famotidine 20 MG tablet Commonly known as: PEPCID   glucose blood test strip       TAKE these medications    ibuprofen 600 MG tablet Commonly known as: ADVIL Take 1 tablet (600 mg total) by mouth every 6 (six) hours.   PrePLUS 27-1 MG Tabs Take by mouth.         Discharge home in stable condition Infant Feeding: Bottle Infant Disposition: home with mother Discharge instruction: per After Visit Summary and Postpartum booklet. Activity: Advance as tolerated. Pelvic rest for 6 weeks.  Diet: routine diet Future Appointments:No future appointments. Follow up Visit:  [place] [facility] [place] [facility]. Schedule an appointment as soon as possible for a visit in 6 week(s).   Specialty: Obstetrics and Gynecology Why: postpartum visit and IUD insertion x 2 Contact information: [place] [phone]               Message sent to [facility] by Dr. Gwenlyn Perking on 04/24/21.  Please schedule this patient for a In  person postpartum visit in 6 weeks with the following provider: Any provider. Additional Postpartum F/U: 2 hour GTT and BP check 1 week  High risk pregnancy complicated by: T8YSO, pre-eclampsia, twin gestation Delivery mode:     Mathilda, Maguire [[id number]]  Vaginal, Spontaneous    Pharrah, Rottman [[id number]]  Vaginal, Spontaneous  Anticipated Birth Control:  Unsure, considering IUD insertion x 2 **message sent to schedule with MD under US guidance and for longer time slot  04/26/2021 Laury Deep, CNM

## 2021-04-24 NOTE — Anesthesia Preprocedure Evaluation (Signed)
Anesthesia Evaluation  Patient identified by MRN, date of birth, ID band Patient awake    Reviewed: Allergy & Precautions, Patient's Chart, lab work & pertinent test results  Airway Mallampati: III  TM Distance: >3 FB Neck ROM: Full    Dental no notable dental hx. (+) Teeth Intact   Pulmonary former smoker,    Pulmonary exam normal breath sounds clear to auscultation       Cardiovascular hypertension, Pt. on medications Normal cardiovascular exam Rhythm:Regular Rate:Normal     Neuro/Psych negative neurological ROS  negative psych ROS   GI/Hepatic Neg liver ROS, GERD  Medicated,  Endo/Other  diabetes, Well Controlled, GestationalMorbid obesity  Renal/GU   negative genitourinary   Musculoskeletal negative musculoskeletal ROS (+)   Abdominal (+) + obese,   Peds  Hematology  (+) Blood dyscrasia, anemia ,   Anesthesia Other Findings   Reproductive/Obstetrics (+) Pregnancy Twin Gestation GDM Pre eclampsia                             Anesthesia Physical Anesthesia Plan  ASA: 3  Anesthesia Plan: Epidural   Post-op Pain Management:    Induction:   PONV Risk Score and Plan:   Airway Management Planned: Natural Airway  Additional Equipment:   Intra-op Plan:   Post-operative Plan:   Informed Consent: I have reviewed the patients History and Physical, chart, labs and discussed the procedure including the risks, benefits and alternatives for the proposed anesthesia with the patient or authorized representative who has indicated his/her understanding and acceptance.       Plan Discussed with: Anesthesiologist  Anesthesia Plan Comments:         Anesthesia Quick Evaluation

## 2021-04-24 NOTE — Anesthesia Procedure Notes (Signed)
Epidural Patient location during procedure: OB Start time: 04/24/2021 5:00 PM End time: 04/24/2021 5:10 PM  Staffing Anesthesiologist: Josephine Igo, MD Performed: anesthesiologist   Preanesthetic Checklist Completed: patient identified, IV checked, site marked, risks and benefits discussed, surgical consent, monitors and equipment checked, pre-op evaluation and timeout performed  Epidural Patient position: sitting Prep: DuraPrep and site prepped and draped Patient monitoring: continuous pulse ox and blood pressure Approach: midline Location: L3-L4 Injection technique: LOR air  Needle:  Needle type: Tuohy  Needle gauge: 17 G Needle length: 9 cm and 9 Needle insertion depth: 10 cm Catheter type: closed end flexible Catheter size: 19 Gauge Catheter at skin depth: 15 cm Test dose: negative and Other  Assessment Events: blood not aspirated, injection not painful, no injection resistance, no paresthesia and negative IV test  Additional Notes Patient identified. Risks and benefits discussed including failed block, incomplete  Pain control, post dural puncture headache, nerve damage, paralysis, blood pressure Changes, nausea, vomiting, reactions to medications-both toxic and allergic and post Partum back pain. All questions were answered. Patient expressed understanding and wished to proceed. Sterile technique was used throughout procedure. Epidural site was Dressed with sterile barrier dressing. No paresthesias, signs of intravascular injection Or signs of intrathecal spread were encountered.  Patient was more comfortable after the epidural was dosed. Please see RN's note for documentation of vital signs and FHR which are stable. Reason for block:procedure for pain

## 2021-04-24 NOTE — H&P (Addendum)
OBSTETRIC ADMISSION HISTORY AND PHYSICAL  Tonya Walls is a 31 y.o. female G2P1001 with IUP at [redacted]w[redacted]d by LMP presenting for IOL. Bicornuate uterus affecting pregnancy, Maternal morbid obesity-BMI 58.97, Di/di twin pregnancy, and gestational diabetes mellitus (diet controlled-no meds).  Being induced per MFM for chronic hypertension diagnosed at 25wks with transient mild preeclampsia (elevated P:Cr at 25wks, BPs normal thereafter, P:Cr normal at admission to L&D). Per MFM note, they recommended delivery at 36-37wks. She reports +FMs, No LOF, no VB, no blurry vision, headaches or peripheral edema, and RUQ pain.  She plans on  breastfeeding. She is considering post-placental IUD for birth control. She received her prenatal [facility] at [facility].  Dating: By LMP --->  Estimated Date of Delivery: 05/22/21  Sono:   04/22/2021: Twin A: Est. FW:    2698  gm    5 lb 15 oz      44  %     FW Discordancy         5  % Twin B:  Est. FW:    2843  gm      6 lb 4 oz     60  %     FW Discordancy      0 \ 5 %  Prenatal History/Complications:  -Bicornuate uterus affecting pregnancy -Maternal morbid obesity-BMI 49.97 -Di/di twin pregnancy - Chronic hypertension (dx at 40 weeks-no meds) -Gestational diabetes mellitus (diet controlled-no meds).  Past Medical History: Past Medical History:  Diagnosis Date   Avulsion fracture of tibial tuberosity    acute, left knee   Bicornuate uterus    Gestational diabetes    Obesity, Class III, BMI 40-49.9 (morbid obesity) (HCC)    Preeclampsia    Pregnancy induced hypertension    Wears glasses    Past Surgical History: Past Surgical History:  Procedure Laterality Date   OPEN REDUCTION INTERNAL FIXATION (ORIF) TIBIAL TUBERCLE Left 03/01/2018   Procedure: OPEN REDUCTION INTERNAL FIXATION (ORIF)LEFT  TIBIAL TUBERCLE;  Surgeon: Hiram Gash, MD;  Location: [place];  Service: Orthopedics;  Laterality: Left;   WISDOM TOOTH EXTRACTION     Obstetrical History: OB History      Gravida  2   Para  1   Term  1   Preterm  0   AB  0   Living  1      SAB  0   IAB  0   Ectopic  0   Multiple  0   Live Births  1          Social History Social History   Socioeconomic History   Marital status: Single    Spouse name: Not on file   Number of children: Not on file   Years of education: Not on file   Highest education level: Not on file  Occupational History   Not on file  Tobacco Use   Smoking status: Former    Types: Cigarettes    Quit date: 11/13/2012    Years since quitting: 8.4   Smokeless tobacco: Never  Vaping Use   Vaping Use: Never used  Substance and Sexual Activity   Alcohol use: No   Drug use: No   Sexual activity: Yes    Birth control/protection: None  Other Topics Concern   Not on file  Social History Narrative   Not on file   Social Determinants of Health   Financial Resource Strain: Not on file  Food Insecurity: No Food Insecurity   Worried About Running  Out of Food in the Last Year: Never true   Ran Out of Food in the Last Year: Never true  Transportation Needs: No Transportation Needs   Lack of Transportation (Medical): No   Lack of Transportation (Non-Medical): No  Physical Activity: Not on file  Stress: Not on file  Social Connections: Not on file   Family History: Family History  Problem Relation Age of Onset   Healthy Mother    Diabetes Father    Allergies: No Known Allergies  Medications Prior to Admission  Medication Sig Dispense Refill Last Dose   Accu-Chek Softclix Lancets lancets Use four times daily as instructed. 100 each 12    aspirin EC 81 MG tablet Take 1 tablet (81 mg total) by mouth daily. Take after 12 weeks for prevention of preeclampsia later in pregnancy 300 tablet 2    Blood Glucose Monitoring Suppl (ACCU-CHEK GUIDE) w/Device KIT 1 Device by Does not apply route in the morning, at noon, in the evening, and at bedtime. 1 kit 0    famotidine (PEPCID) 20 MG tablet Take 20 mg by mouth 2  (two) times daily. (Patient not taking: Reported on 04/15/2021)      glucose blood test strip Use as instructed 100 each 12    Prenatal Vit-Fe Fumarate-FA (PREPLUS) 27-1 MG TABS Take by mouth.      Review of Systems   All systems reviewed and negative except as stated in HPI  Blood pressure 127/66, pulse 94, temperature 98.6 F (37 C), temperature source Oral, resp. rate 18, height $RemoveBe'5\' 3"'iEnTeWAOB$  (1.6 m), weight (!) 151 kg, last menstrual period 08/15/2020. General appearance: alert, cooperative, appears stated age, no distress, and moderately obese Lungs: normal effort, no problems with respiration noted Heart: regular rate and rhythm, warm and well perfused Abdomen: soft, non-tender Pelvic:normal external female genitalia, no blood or discharge noted Extremities: Homans sign is negative, no sign of DVT Presentation: cephalic via BSUS  Fetal monitoring Intermittent Baby A: 150's, moderate variability, 15x15 accels, no decels, Baby B: 140's, moderate variability, 15x15 accels, no decels   Uterine activity Dilation: Fingertip Effacement (%): 50 Station: -2 Exam by:: lee  Prenatal labs: ABO, Rh: --/--/O POS (02/10 9735) Antibody: NEG (02/10 3299) Rubella: 4.99 (08/22 1027) RPR: NON REACTIVE (02/10 0812)  HBsAg: Negative (08/22 1027)  HIV: Non Reactive (12/01 0825)  GBS: Negative/-- (02/01 1436)  2 hr Glucola screening positive for GDM, has remained diet controlled Genetic screening  LR Panoroma, AFP negative Anatomy US Complete per reports and appeared normal  Prenatal Transfer Tool  Maternal Diabetes: Yes:  Diabetes Type:  Diet controlled Genetic Screening: Normal Maternal Ultrasounds/Referrals: Normal Fetal Ultrasounds or other Referrals:  Referred to Materal Fetal Medicine  Maternal Substance Abuse:  No Significant Maternal Medications:  None Significant Maternal Lab Results: Group B Strep negative  Results for orders placed or performed during the hospital encounter of 04/24/21  (from the past 24 hour(s))  CBC   Collection Time: 04/24/21  8:12 AM  Result Value Ref Range   WBC 5.5 4.0 - 10.5 K/uL   RBC 4.05 3.87 - 5.11 MIL/uL   Hemoglobin 10.7 (L) 12.0 - 15.0 g/dL   HCT 35.3 (L) 36.0 - 46.0 %   MCV 87.2 80.0 - 100.0 fL   MCH 26.4 26.0 - 34.0 pg   MCHC 30.3 30.0 - 36.0 g/dL   RDW 24.1 (H) 11.5 - 15.5 %   Platelets 290 150 - 400 K/uL   nRBC 0.0 0.0 - 0.2 %  RPR   Collection Time: 04/24/21  8:12 AM  Result Value Ref Range   RPR Ser Ql NON REACTIVE NON REACTIVE  Comprehensive metabolic panel   Collection Time: 04/24/21  8:12 AM  Result Value Ref Range   Sodium 136 135 - 145 mmol/L   Potassium 3.7 3.5 - 5.1 mmol/L   Chloride 107 98 - 111 mmol/L   CO2 18 (L) 22 - 32 mmol/L   Glucose, Bld 88 70 - 99 mg/dL   BUN 9 6 - 20 mg/dL   Creatinine, Ser 0.56 0.44 - 1.00 mg/dL   Calcium 8.8 (L) 8.9 - 10.3 mg/dL   Total Protein 6.5 6.5 - 8.1 g/dL   Albumin 2.5 (L) 3.5 - 5.0 g/dL   AST 31 15 - 41 U/L   ALT 20 0 - 44 U/L   Alkaline Phosphatase 136 (H) 38 - 126 U/L   Total Bilirubin 0.6 0.3 - 1.2 mg/dL   GFR, Estimated >60 >60 mL/min   Anion gap 11 5 - 15  Type and screen   Collection Time: 04/24/21  8:12 AM  Result Value Ref Range   ABO/RH(D) O POS    Antibody Screen NEG    Sample Expiration      04/27/2021,2359 Performed at [facility], [address], [place], [place] [address]   Protein / creatinine ratio, urine   Collection Time: 04/24/21 10:42 AM  Result Value Ref Range   Creatinine, Urine 314.02 mg/dL   Total Protein, Urine 54 mg/dL   Protein Creatinine Ratio 0.17 (H) 0.00 - 0.15 mg/mg[Cre]  Urinalysis, Routine w reflex microscopic Urine, Clean Catch   Collection Time: 04/24/21 10:42 AM  Result Value Ref Range   Color, Urine AMBER (A) YELLOW   APPearance CLOUDY (A) CLEAR   Specific Gravity, Urine 1.027 1.005 - 1.030   pH 6.0 5.0 - 8.0   Glucose, UA NEGATIVE NEGATIVE mg/dL   Hgb urine dipstick NEGATIVE NEGATIVE   Bilirubin Urine NEGATIVE  NEGATIVE   Ketones, ur 20 (A) NEGATIVE mg/dL   Protein, ur 30 (A) NEGATIVE mg/dL   Nitrite NEGATIVE NEGATIVE   Leukocytes,Ua NEGATIVE NEGATIVE   RBC / HPF 0-5 0 - 5 RBC/hpf   WBC, UA 11-20 0 - 5 WBC/hpf   Bacteria, UA MANY (A) NONE SEEN   Squamous Epithelial / LPF 11-20 0 - 5   Mucus PRESENT    Patient Active Problem List   Diagnosis Date Noted   Twin gestation, dichorionic diamniotic 04/24/2021   Gestational diabetes mellitus, antepartum 02/14/2021   Hypertension affecting pregnancy in second trimester 02/12/2021   BMI 50.0-59.9, adult ([place]) 12/27/2020   Dizygotic twin pregnancy 12/27/2020   Anemia affecting pregnancy in third trimester 11/30/2020   Supervision of high risk pregnancy, antepartum 10/07/2020   Maternal morbid obesity, antepartum ([place]) 11/27/2015   Bicornuate uterus affecting pregnancy, antepartum 04/15/2015   Assessment/Plan:  Karita L Madeira is a 31 y.o. G2P1001 at [redacted]w[redacted]d here for IOL for di/di twin pregnancy with A1GDM and cHTN  #Labor: will start IOL with cytotec 50 mcg buccal x 1 now, will assess for FB at next CE #cHTN: Asymptomatic on admission with normal labs and normal range BP's.   #A1GDM: glucose 88 on admission, well controlled with diet #Pain: Plans for epidural  #FWB: Cat 1 #ID:  GBS negative #MOF: Breastfeeding #MOC:discussed R/B/A of post-placental Mirena IUD #Circ:  Will discuss  Olga Coaster, Student-MidWife  04/24/2021, 3:20 PM   Attestation of Supervision of Student:  I confirm  that I have verified the information documented in the nurse midwife students note and that I have also personally  supervised  the history, physical exam and all medical decision making activities.  I have verified that all services and findings are accurately documented in this student's note; and I agree with management and plan as outlined in the documentation. I have also made any necessary editorial changes.  Gabriel Carina, [place] for [employer],  [place]

## 2021-04-25 ENCOUNTER — Encounter (HOSPITAL_COMMUNITY): Payer: Self-pay | Admitting: Family Medicine

## 2021-04-25 MED ORDER — DIBUCAINE (PERIANAL) 1 % EX OINT: 1.0000 "application " | TOPICAL_OINTMENT | CUTANEOUS | Status: DC | PRN

## 2021-04-25 MED ORDER — COCONUT OIL OIL: 1.0000 "application " | TOPICAL_OIL | Status: DC | PRN

## 2021-04-25 MED ORDER — ONDANSETRON HCL 4 MG PO TABS: 4.0000 mg | ORAL_TABLET | ORAL | Status: DC | PRN

## 2021-04-25 MED ORDER — TETANUS-DIPHTH-ACELL PERTUSSIS 5-2.5-18.5 LF-MCG/0.5 IM SUSY: 0.5000 mL | PREFILLED_SYRINGE | Freq: Once | INTRAMUSCULAR | Status: DC

## 2021-04-25 MED ORDER — DIPHENHYDRAMINE HCL 25 MG PO CAPS: 25.0000 mg | ORAL_CAPSULE | Freq: Four times a day (QID) | ORAL | Status: DC | PRN

## 2021-04-25 MED ORDER — PRENATAL MULTIVITAMIN CH
1.0000 | ORAL_TABLET | Freq: Every day | ORAL | Status: DC
  Administered 2021-04-25 – 2021-04-26 (×2): 1 via ORAL
  Filled 2021-04-25 (×2): qty 1

## 2021-04-25 MED ORDER — WITCH HAZEL-GLYCERIN EX PADS: 1.0000 "application " | MEDICATED_PAD | CUTANEOUS | Status: DC | PRN

## 2021-04-25 MED ORDER — ACETAMINOPHEN 325 MG PO TABS: 650.0000 mg | ORAL_TABLET | ORAL | Status: DC | PRN

## 2021-04-25 MED ORDER — SIMETHICONE 80 MG PO CHEW: 80.0000 mg | CHEWABLE_TABLET | ORAL | Status: DC | PRN

## 2021-04-25 MED ORDER — IBUPROFEN 600 MG PO TABS
600.0000 mg | ORAL_TABLET | Freq: Four times a day (QID) | ORAL | Status: DC
  Administered 2021-04-25 – 2021-04-26 (×7): 600 mg via ORAL
  Filled 2021-04-25 (×7): qty 1

## 2021-04-25 MED ORDER — SENNOSIDES-DOCUSATE SODIUM 8.6-50 MG PO TABS
2.0000 | ORAL_TABLET | Freq: Every day | ORAL | Status: DC
  Administered 2021-04-25 – 2021-04-26 (×2): 2 via ORAL
  Filled 2021-04-25 (×2): qty 2

## 2021-04-25 MED ORDER — BENZOCAINE-MENTHOL 20-0.5 % EX AERO: 1.0000 "application " | INHALATION_SPRAY | CUTANEOUS | Status: DC | PRN

## 2021-04-25 MED ORDER — ONDANSETRON HCL 4 MG/2ML IJ SOLN: 4.0000 mg | INTRAMUSCULAR | Status: DC | PRN

## 2021-04-25 NOTE — Anesthesia Postprocedure Evaluation (Signed)
Anesthesia Post Note  Patient: Tonya Walls  Procedure(s) Performed: AN AD HOC LABOR EPIDURAL     Patient location during evaluation: Mother Baby Anesthesia Type: Epidural Level of consciousness: awake Pain management: satisfactory to patient Vital Signs Assessment: post-procedure vital signs reviewed and stable Respiratory status: spontaneous breathing Cardiovascular status: stable Anesthetic complications: no   No notable events documented.  Last Vitals:  Vitals:   04/25/21 0216 04/25/21 0603  BP: 119/70 104/73  Pulse:  96  Resp: 20 18  Temp: 37.2 C (!) 36.3 C  SpO2: 99% 95%    Last Pain:  Vitals:   04/25/21 0720  TempSrc:   PainSc: Asleep   Pain Goal:                   [name]

## 2021-04-26 LAB — GLUCOSE, CAPILLARY: Glucose-Capillary: 83 mg/dL (ref 70–99)

## 2021-04-26 MED ORDER — IBUPROFEN 600 MG PO TABS: 600.0000 mg | ORAL_TABLET | Freq: Four times a day (QID) | ORAL | 0 refills | Status: DC

## 2021-04-27 ENCOUNTER — Telehealth: Payer: Self-pay | Admitting: Family Medicine

## 2021-04-27 NOTE — Telephone Encounter (Signed)
Called patient to inform her of appointments, there was no answer to the call so a voicemail was left with the call back number for the office and a letter was mailed.

## 2021-04-28 LAB — SURGICAL PATHOLOGY

## 2021-04-29 ENCOUNTER — Encounter: Payer: Medicaid Other | Admitting: Family Medicine

## 2021-05-01 ENCOUNTER — Ambulatory Visit: Payer: Medicaid Other

## 2021-05-05 ENCOUNTER — Telehealth (HOSPITAL_COMMUNITY): Payer: Self-pay

## 2021-05-05 NOTE — Telephone Encounter (Signed)
No answer. Left message to return nurse call.  Marcelino Duster [facility] 05/05/2021,1332

## 2021-05-06 ENCOUNTER — Encounter: Payer: Medicaid Other | Admitting: Obstetrics and Gynecology

## 2021-05-13 ENCOUNTER — Encounter: Payer: Medicaid Other | Admitting: Obstetrics & Gynecology

## 2021-05-20 ENCOUNTER — Encounter: Payer: Medicaid Other | Admitting: Obstetrics and Gynecology

## 2021-05-24 ENCOUNTER — Encounter: Payer: Self-pay | Admitting: Obstetrics and Gynecology

## 2021-05-25 ENCOUNTER — Telehealth: Payer: Self-pay | Admitting: General Practice

## 2021-05-25 MED ORDER — MEDROXYPROGESTERONE ACETATE 10 MG PO TABS: 20.0000 mg | ORAL_TABLET | Freq: Every day | ORAL | 1 refills | Status: DC

## 2021-05-25 NOTE — Telephone Encounter (Signed)
Called patient regarding mychart message and asked for more information about her bleeding. Patient states she was bleeding for 2 weeks after being discharged from having the babies then it stopped for several days and then started back again but has been very heavy. Patient states she had to put infant diapers in her panties because regular pads would overflow. She states she has to change these out every hour and is passing quarter sized clots as well. Discussed with Dr Donavan Foil who states patient likely has heavy bleeding following delivery and doesn't sound concerning for retained products. He recommends ultrasound just to rule out retained products and Rx for Provera 20mg  daily to help with bleeding. Scheduled ultrasound for 3/20 & Rx sent to pharmacy. Precautions given for excessive bleeding and MAU recommendation. Patient verbalized understanding and asked about migraines being related. Told patient not necessarily, that could be more concerning about her blood pressure. Recommended she take her blood pressure when she she gets home and to contact me if it is elevated. Patient verbalized understanding. ?

## 2021-06-01 ENCOUNTER — Inpatient Hospital Stay: Admission: RE | Admit: 2021-06-01 | Payer: Medicaid Other | Source: Ambulatory Visit

## 2021-06-04 ENCOUNTER — Other Ambulatory Visit: Payer: Medicaid Other

## 2021-06-04 ENCOUNTER — Ambulatory Visit: Payer: Medicaid Other | Admitting: Obstetrics & Gynecology

## 2021-06-04 ENCOUNTER — Other Ambulatory Visit: Payer: Self-pay | Admitting: General Practice

## 2021-06-04 DIAGNOSIS — Z8632 Personal history of gestational diabetes: Secondary | ICD-10-CM

## 2021-06-15 ENCOUNTER — Other Ambulatory Visit: Payer: Medicaid Other

## 2021-06-25 ENCOUNTER — Ambulatory Visit: Payer: Medicaid Other | Admitting: Obstetrics and Gynecology

## 2021-07-27 ENCOUNTER — Encounter: Payer: Self-pay | Admitting: Medical

## 2021-07-27 ENCOUNTER — Ambulatory Visit (INDEPENDENT_AMBULATORY_CARE_PROVIDER_SITE_OTHER): Payer: Medicaid Other | Admitting: Medical

## 2021-07-27 VITALS — BP 116/69 | HR 97 | Wt 298.0 lb

## 2021-07-27 DIAGNOSIS — F419 Anxiety disorder, unspecified: Secondary | ICD-10-CM

## 2021-07-27 DIAGNOSIS — F5104 Psychophysiologic insomnia: Secondary | ICD-10-CM

## 2021-07-27 DIAGNOSIS — I10 Essential (primary) hypertension: Secondary | ICD-10-CM | POA: Diagnosis not present

## 2021-07-27 DIAGNOSIS — R102 Pelvic and perineal pain: Secondary | ICD-10-CM | POA: Diagnosis not present

## 2021-07-27 NOTE — Progress Notes (Signed)
Patient stated that she wants the [place] IUD for contraception. ? ?I informed patient that she will need to repeat her glucose test being the at she had GDM during pregnancy. Patient verbalized understanding and stated she will schedule an appointment for a later date ?

## 2021-07-27 NOTE — Progress Notes (Signed)
? ? ?Post Partum Visit Note ? ?Tonya Walls is a 31 y.o. G37P1103 female who presents for a postpartum visit. She is several weeks postpartum following a normal spontaneous vaginal delivery.  I have fully reviewed the prenatal and intrapartum course. The delivery was at 33 gestational weeks.  Anesthesia: epidural. Postpartum course has been uncomplicated. Babies are doing well. Baby is feeding by bottle - gerber good start gentle . Bleeding no bleeding. Bowel function is normal. Bladder function is normal. Patient is sexually active. Contraception method is none. Postpartum depression screening: not performed. ? ? Upstream - 07/27/21 1152   ? ?  ? Pregnancy Intention Screening  ? Does the patient want to become pregnant in the next year? No   ? Does the patient's partner want to become pregnant in the next year? No   ? Would the patient like to discuss contraceptive options today? Yes   ?  ? Contraception Wrap Up  ? Current Method No Method - Other Reason;Withdrawal or Other Method   ? End Method IUD or IUS   ? Contraception Counseling Provided Yes   ? ?  ?  ? ?  ? ?The pregnancy intention screening data noted above was reviewed. Potential methods of contraception were discussed. The patient elected to proceed with IUD or IUS. ? ? ? ?Health Maintenance Due  ?Topic Date Due  ? URINE MICROALBUMIN  Never done  ? ? ?The following portions of the patient's history were reviewed and updated as appropriate: allergies, current medications, past family history, past medical history, past social history, past surgical history, and problem list. ? ?Review of Systems ?Pertinent items are noted in HPI. ? ?Objective:  ?BP 116/69   Pulse 97   Wt 298 lb (135.2 kg)   LMP 07/01/2021 (Exact Date)   Breastfeeding No   BMI 52.79 kg/m?   ? ?General:  alert and cooperative  ? Breasts:  not indicated  ?Lungs: clear to auscultation bilaterally  ?Heart:  regular rate and rhythm, S1, S2 normal, no murmur, click, rub or gallop   ?Abdomen: Soft, non-tender    ?Wound N/A  ?GU exam:  not indicated  ?     ?Assessment:  ? ? ?Normal postpartum exam.  ?Unwanted fertility  ? ?Plan:  ? ?Essential components of care per ACOG recommendations: ? ?1.  Mood and well being: Patient with not peformed depression screening today. Reviewed local resources for support.  ?- Patient tobacco use? No.   ?- hx of drug use? No.   ? ?2. Infant care and feeding:  ?-Patient currently breastmilk feeding? No.  ?-Social determinants of health (SDOH) reviewed in EPIC. No concerns ? ?3. Sexuality, contraception and birth spacing ?- Patient does not want a pregnancy in the next year.  Desired family size is 3 children.  ?- Reviewed reproductive life planning. Reviewed contraceptive methods based on pt preferences and effectiveness.  Patient desired No Method - Other Reason today. Patient would still like to consider IUD for [facility]. We discussed that these would need to be placed in both horns of the bicornate uterus under US guidance. We will schedule as soon as possible and at time of insertion, if [place] does not show sufficient space or other reason that IUD is not optimal or recommended, then we will have to revisit the other options.  ?- She was also cautioned to abstain or use protection at least 2 weeks prior to IUD insertion to ensure that we could be reasonably certain she was not  pregnant at the time of the appointment.  ? ?4. Sleep and fatigue ?-Encouraged family/partner/community support of 4 hrs of uninterrupted sleep to help with mood and fatigue ? ?5. Physical Recovery  ?- Discussed patients delivery and complications. She describes her labor as good. ?- Patient had a Vaginal, no problems at delivery. Patient had a second degree laceration. Perineal healing reviewed. Patient expressed understanding ?- Patient has urinary incontinence? No. ?- Patient is safe to resume physical and sexual activity ? ?6.  Health Maintenance ?- HM due items addressed Yes ?- Last pap  smear  ?Diagnosis  ?Date Value Ref Range Status  ?12/18/2018   Final  ? - Negative for intraepithelial lesion or malignancy (NILM)  ? Pap smear not done at today's visit.  ?-Breast Cancer screening indicated? No.  ? ?7. Chronic Disease/Pregnancy Condition follow up: Hypertension and Gestational Diabetes ?- Patient normotensive today without medications  ?- Will scheduled 2 hour PP GTT as soon as possible ?- Consider PCP referral ? ?Kerry Hough, PA-C ?[facility] ? ?

## 2021-07-28 ENCOUNTER — Ambulatory Visit: Payer: Self-pay

## 2021-07-28 NOTE — Therapy (Incomplete)
?OUTPATIENT PHYSICAL THERAPY FEMALE PELVIC EVALUATION ? ? ?Patient Name: Tonya Walls ?MRN: [id number] ?DOB:24-Jan-1991, 31 y.o., female ?Today's Date: 07/28/2021 ? ? ? ?Past Medical History:  ?Diagnosis Date  ? Avulsion fracture of tibial tuberosity   ? acute, left knee  ? Bicornuate uterus   ? Gestational diabetes   ? Obesity, Class III, BMI 40-49.9 (morbid obesity) (HCC)   ? Preeclampsia   ? Pregnancy induced hypertension   ? Wears glasses   ? ?Past Surgical History:  ?Procedure Laterality Date  ? OPEN REDUCTION INTERNAL FIXATION (ORIF) TIBIAL TUBERCLE Left 03/01/2018  ? Procedure: OPEN REDUCTION INTERNAL FIXATION (ORIF)LEFT  TIBIAL TUBERCLE;  Surgeon: Bjorn Pippin, MD;  Location: [facility];  Service: Orthopedics;  Laterality: Left;  ? WISDOM TOOTH EXTRACTION    ? ?Patient Active Problem List  ? Diagnosis Date Noted  ? Hypertension 07/27/2021  ? History of gestational diabetes 02/14/2021  ? BMI 50.0-59.9, adult (HCC) 12/27/2020  ? History of twin pregnancy in prior pregnancy 12/27/2020  ? 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 ? ?PCP: *** ? ?REFERRING PROVIDER: *** ? ?REFERRING DIAG: *** ? ?THERAPY DIAG:  ?No diagnosis found. ? ?ONSET DATE: *** ? ?SUBJECTIVE:                                                                                                                                                                                          ? ?SUBJECTIVE STATEMENT: ?*** ?Fluid intake: {Yes/No:304960894}  ? ?Patient confirms identification and approves PT to assess pelvic floor and treatment {yes/no:20286} ? ? ?PAIN:  ?Are you having pain? {yes/no:20286} ?NPRS scale: ***/10 ?Pain location: {pelvic pain location:27098} ? ?Pain type: {type:313116} ?Pain description: {PAIN DESCRIPTION:21022940}  ? ?Aggravating factors: *** ?Relieving factors: *** ? ?PRECAUTIONS: {Therapy precautions:24002} ? ?WEIGHT BEARING RESTRICTIONS {Yes ***/No:24003} ? ?FALLS:  ?Has patient fallen in last 6 months? {fallsyesno:27318} ? ?LIVING  ENVIRONMENT: ?Lives with: {[facility] lives with:25569::"lives with their family"} ?Lives in: {Lives in:25570} ?Stairs: {opstairs:27293} ?Has following equipment at home: {Assistive devices:23999} ? ?OCCUPATION: *** ? ?PLOF: {PLOF:24004} ? ?PATIENT GOALS *** ? ?PERTINENT HISTORY:  ?*** ?Sexual abuse: {Yes/No:304960894} ? ?BOWEL MOVEMENT ?Pain with bowel movement: {yes/no:20286} ?Type of bowel movement:{PT BM type:27100} ?Fully empty rectum: {Yes/No:304960894} ?Leakage: {Yes/No:304960894} ?Pads: {Yes/No:304960894} ?Fiber supplement: {Yes/No:304960894} ? ?URINATION ?Pain with urination: {yes/no:20286} ?Fully empty bladder: {Yes/No:304960894} ?Stream: {PT urination:27102} ?Urgency: {Yes/No:304960894} ?Frequency: *** ?Leakage: {PT leakage:27103} ?Pads: {Yes/No:304960894} ? ?INTERCOURSE ?Pain with intercourse: {pain with intercourse PA:27099} ?Ability to have vaginal penetration:  {Yes/No:304960894} ?[place]: *** ?Marinoff Scale: ***/3 ? ?PREGNANCY ?Vaginal deliveries *** ?Tearing {Yes***/No:304960894} ?C-section deliveries *** ?Currently pregnant {Yes***/No:304960894} ? ?PROLAPSE ?{PT prolapse:27101} ? ? ? ?OBJECTIVE:  ? ?DIAGNOSTIC  FINDINGS:  ?*** ? ?PATIENT SURVEYS:  ?{rehab surveys:24030} ? ?PFIQ-7 *** ? ?COGNITION: ? Overall cognitive status: {cognition:24006}   ?  ?SENSATION: ? Light touch: {intact/deficits:24005} ? Proprioception: {intact/deficits:24005} ? ?MUSCLE LENGTH: ?Hamstrings: Right *** deg; Left *** deg ?Thomas test: Right *** deg; Left *** deg ? ?LUMBAR SPECIAL TESTS:  ?{lumbar special test:25242} ? ?FUNCTIONAL TESTS:  ?{Functional tests:24029} ? ?GAIT: ? ?Comments: *** ? ?POSTURE:  ?*** ? ?LUMBARAROM/PROM ? ?A/PROM A/PROM  ?07/28/2021  ?Flexion   ?Extension   ?Right lateral flexion   ?Left lateral flexion   ?Right rotation   ?Left rotation   ? (Blank rows = not tested) ? ?LE ROM: ? ?LE MMT: ? ?MMT Right ?07/28/2021 Left ?07/28/2021  ?Hip flexion    ?Hip extension    ?Hip abduction    ?Hip adduction    ?Hip  internal rotation    ?Hip external rotation    ?Knee flexion    ?Knee extension    ?Ankle dorsiflexion    ?Ankle plantarflexion    ?Ankle inversion    ?Ankle eversion    ? ?PELVIC MMT: ?  ? ?      PALPATION: ?  General  *** ? ?              External Perineal Exam *** ?              ?              Internal Pelvic Floor *** ? ?TONE: ?*** ? ?PROLAPSE: ?*** ? ?TODAY'S TREATMENT 07/28/21 ?EVAL  ?Manual: ?Soft tissue mobilization: ?Scar tissue mobilization: ?Myofascial release: ?Spinal mobilization: ?Internal pelvic floor techniques: ?Dry needling: ?Neuromuscular re-education: ?Core retraining:  ?Core facilitation: ?Form correction: ?Pelvic floor contraction training: ?Down training: ?Exercises: ?Stretches/mobility: ?Strengthening: ?Therapeutic activities: ?Functional strengthening activities: ?Self-care: ? ? ? ? ?PATIENT EDUCATION:  ?Education details: *** ?Person educated: Patient ?Education method: Explanation, Demonstration, Tactile cues, Verbal cues, and Handouts ?Education comprehension: verbalized understanding ? ? ?HOME EXERCISE PROGRAM: ?*** ? ?ASSESSMENT: ? ?CLINICAL IMPRESSION: ?Patient is a 31 y.o. female who was seen today for physical therapy evaluation and treatment for ***.  ? ? ?OBJECTIVE IMPAIRMENTS decreased activity tolerance, decreased coordination, decreased endurance, decreased mobility, decreased strength, hypomobility, increased fascial restrictions, increased muscle spasms, impaired flexibility, impaired tone, postural dysfunction, and pain.  ? ?ACTIVITY LIMITATIONS {activity limitations:25113}.  ? ?PERSONAL FACTORS {Personal factors:25162} are also affecting patient's functional outcome.  ? ? ?REHAB POTENTIAL: {rehabpotential:25112} ? ?CLINICAL DECISION MAKING: {clinical decision making:25114} ? ?EVALUATION COMPLEXITY: {Evaluation complexity:25115} ? ? ?GOALS: ?Goals reviewed with patient? Yes ? ?SHORT TERM GOALS: Target date: 08/25/2021  ? ?Pt will be independent with HEP.  ? ?Baseline: ?Goal  status: INITIAL ? ?2.  *** ?Baseline:  ?Goal status: {GOALSTATUS:25110} ? ?3.  *** ?Baseline:  ?Goal status: {GOALSTATUS:25110} ? ?4.  *** ?Baseline:  ?Goal status: {GOALSTATUS:25110} ? ?5.  *** ?Baseline:  ?Goal status: {GOALSTATUS:25110} ? ?6.  *** ?Baseline:  ?Goal status: {GOALSTATUS:25110} ? ?LONG TERM GOALS: Target date: 10/20/2021  ? ?Pt will be independent with advanced HEP.  ? ?Baseline:  ?Goal status: INITIAL ? ?2.  *** ?Baseline:  ?Goal status: {GOALSTATUS:25110} ? ?3.  *** ?Baseline:  ?Goal status: {GOALSTATUS:25110} ? ?4.  *** ?Baseline:  ?Goal status: {GOALSTATUS:25110} ? ?5.  *** ?Baseline:  ?Goal status: {GOALSTATUS:25110} ? ?6.  *** ?Baseline:  ?Goal status: {GOALSTATUS:25110} ? ?PLAN: ?PT FREQUENCY: {rehab frequency:25116} ? ?PT DURATION: {rehab duration:25117} ? ?PLANNED INTERVENTIONS: Therapeutic exercises, Therapeutic activity, Neuromuscular re-education, Balance training, Gait training, Patient/Family education,  Joint mobilization, Dry Needling, Biofeedback, and Manual therapy ? ?PLAN FOR NEXT SESSION: *** ? ? ?Marisue Ivan, PT ?07/28/2021, 1:44 PM ? ?

## 2021-08-04 ENCOUNTER — Encounter: Payer: Medicaid Other | Admitting: Licensed Clinical Social Worker

## 2021-08-05 ENCOUNTER — Encounter: Payer: Self-pay | Admitting: Physical Therapy

## 2021-08-05 ENCOUNTER — Ambulatory Visit: Payer: Medicaid Other | Attending: Medical | Admitting: Physical Therapy

## 2021-08-05 DIAGNOSIS — M6281 Muscle weakness (generalized): Secondary | ICD-10-CM | POA: Diagnosis present

## 2021-08-05 DIAGNOSIS — R102 Pelvic and perineal pain: Secondary | ICD-10-CM | POA: Diagnosis present

## 2021-08-05 NOTE — Therapy (Addendum)
OUTPATIENT PHYSICAL THERAPY FEMALE PELVIC EVALUATION   Patient Name: Tonya Walls MRN: [id number] DOB:[birth year], 31 y.o., female Today's Date: 08/05/2021   PT End of Session - 08/05/21 1527     Visit Number 1    Date for PT Re-Evaluation 10/28/21    Authorization Type [employer]    Authorization - Number of Visits 1    Progress Note Due on Visit 12    PT Start Time 0930    PT Stop Time 1010    PT Time Calculation (min) 40 min    Activity Tolerance Patient tolerated treatment well;No increased pain    Behavior During Therapy WFL for tasks assessed/performed             Past Medical History:  Diagnosis Date   Avulsion fracture of tibial tuberosity    acute, left knee   Bicornuate uterus    Gestational diabetes    Obesity, Class III, BMI 40-49.9 (morbid obesity) (HCC)    Preeclampsia    Pregnancy induced hypertension    Wears glasses    Past Surgical History:  Procedure Laterality Date   OPEN REDUCTION INTERNAL FIXATION (ORIF) TIBIAL TUBERCLE Left 03/01/2018   Procedure: OPEN REDUCTION INTERNAL FIXATION (ORIF)LEFT  TIBIAL TUBERCLE;  Surgeon: Hiram Gash, MD;  Location: [facility];  Service: Orthopedics;  Laterality: Left;   WISDOM TOOTH EXTRACTION     Patient Active Problem List   Diagnosis Date Noted   Hypertension 07/27/2021   History of gestational diabetes 02/14/2021   BMI 50.0-59.9, adult ([place]) 12/27/2020   History of twin pregnancy in prior pregnancy 12/27/2020   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 Pelvic bone pops with turning in bed, intercourse or standing. Patient is not able to hold her bladder like she used to. Patient has to run to the bathroom when she has the urge and able to make it with slight leakage.  Fluid intake: Yes: water    Patient confirms identification and approves PT to assess pelvic floor and treatment No; she had her children in the room.    PAIN:  Are you having pain?  Yes NPRS scale: 8/10 Pain location:  suprapubic and groin  Pain type: burning Pain description: intermittent and burning   Aggravating factors: sitting, laying down on both sides,  Relieving factors: nothing  PRECAUTIONS: None  WEIGHT BEARING RESTRICTIONS No  FALLS:  Has patient fallen in last 6 months? 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  PLOF: Independent  PATIENT GOALS reduce pain and popping  PERTINENT HISTORY:  Vaginal birth 04/24/2021  BOWEL MOVEMENT Pain with bowel movement: Yes Type of bowel movement:Type (Bristol Stool Scale) Type 1-6, Frequency 1-2 times per day, and Strain No Fully empty rectum: Yes: sometimes Leakage: No Fiber supplement: none  URINATION Pain with urination: No Fully empty bladder: Yes: but sometimes feels like there is more Stream:  sometimes weak or strong Urgency: Yes: only has a little leakage while walking to the bathroom Frequency: every 30 min to 1 hour without drinking Leakage: Walking to the bathroom, Coughing, Sneezing, Exercise, and Lifting Pads: Yes: 2-3 pads per day medium   INTERCOURSE Pain with intercourse: no  PREGNANCY Vaginal deliveries 3 Tearing Yes: 2nd degree tear Currently pregnant No  PROLAPSE None    OBJECTIVE:   PATIENT SURVEYS:  PFIQ-7 PFIQ-7 81; UIQ-7 48; POPIQ-7 33  COGNITION:  Overall cognitive status: Within functional limits for tasks assessed      FUNCTIONAL  TESTS:  Single leg stance left 5 sec, right 2 sec with increased trunk sway   POSTURE:  Increased lumbar lordosis  LUMBARAROM/PROM  A/PROM A/PROM  eval  Flexion full  Extension Decreased by 25% going more to the right  Right lateral flexion   Left lateral flexion Decreased by 25%   (Blank rows = not tested)  LOWER EXTREMITY ROM:    Active ROM Right eval Left eval  Hip flexion    Hip extension 5 -10   (Blank rows = not tested)  LOWER EXTREMITY MMT:  MMT Right eval Left eval  Hip flexion    Hip extension 3+/5 4/5;   Hip abduction 4/5 4/5   PELVIC MMT:   MMT eval  Vaginal   Diastasis Recti None  (Blank rows = not tested)        PALPATION:   General  Decreased lower rib cage movement with breath; Difficulty with contracting the abdominals and pushing the abdomen outward; tenderness located on the right SI area, left Quadratus, left SI joint,  left ilium posteriorly rotated, Sacrum rotated to the right,                 External Perineal Exam No tenderness                              Internal Pelvic Floor Not done today due to have the 3 kids in the room. Will be done in the future  TONE: none  PROLAPSE: Not assessed yet  TODAY'S TREATMENT  EVAL Joint mobilization to L1-L5 grade 3  P-A and rotational; Sacral rotation mobilization to correct rotation   PATIENT EDUCATION:  Education details: Access Code: [facility] Person educated: Patient Education method: Explanation, Demonstration, and Handouts Education comprehension: verbalized understanding and returned demonstration   HOME EXERCISE PROGRAM: Access Code: [facility] URL: [email or web address] Date: 08/05/2021 Prepared by: Earlie Counts  Exercises - Cat Cow  - 1 x daily - 7 x weekly - 1 sets - 10 reps - Child's Pose Stretch  - 1 x daily - 7  x weekly - 1 sets - 2 reps - 30 sec hold  ASSESSMENT:  CLINICAL IMPRESSION: Patient is a 31 y.o. female who was seen today for physical therapy  evaluation and treatment for pelvic pain and urinary leakage. Patient had a vaginal birth with twins on 04/24/2021. Patient reports pelvic pain at level 8/10 with sitting, laying down on both sides, and movement. Patient reports popping in the pelvis. She will leak urine with walking to the bathroom, coughing, sneezing, exercise, and lifting. She wears 2-3 medium size pads per day. Patient has to go to the bathroom every 20 minutes and she has reduced the amount she is drinking due to that. Patient does not feel like she always empties her bladder. She has weakness in bilateral hip extensors and abductors. Right hip extension is 5 degrees in prone actively. Her left ilium is rotated posteriorly. Her sacrum is rotated right. She has decreased mobility of the Lumbar spine. She will bulge her abdomen during contraction and difficulty with engaging her lower abdominals. Patient will benefit from skilled therapy to improve core and pelvic strength to stabilize the SI joint and improve pelvic floor coordination to reduce leakage.    OBJECTIVE IMPAIRMENTS decreased activity tolerance, decreased coordination, decreased endurance, decreased strength, increased muscle spasms, and pain.   ACTIVITY LIMITATIONS carrying, lifting, sitting, standing, squatting, continence, and caring for others  PARTICIPATION LIMITATIONS: meal prep, cleaning, laundry, driving, shopping, and community activity  [place] are also affecting patient's functional outcome.   REHAB POTENTIAL: Excellent  CLINICAL DECISION MAKING: Stable/uncomplicated  EVALUATION COMPLEXITY: Low   GOALS: Goals reviewed with patient? Yes  SHORT TERM GOALS: Target date: 09/02/2021  Patient independent with her stretches to lengthen her muscles.  Baseline:Not educated yet.  Goal status: INITIAL   LONG TERM GOALS: Target date: 10/28/2021   Patient independent with HEP to improve core and pelvic strength to improve SI stability.   Baseline: Not educated yet Goal status: INITIAL  2.  Patient is able to sit, stand, and move without her pelvis popping due to increased strength and stability of SI joint.  Baseline: He SI joint will pop with activity.  Goal status: INITIAL  3.  Pelvic floor coordination improves to reduce urinary leakage with walking to the bathroom, coughing, sneezing, exercise, and lifting so she only wears a light panty liner just in case.  Baseline: wears 2-3 medium pads Goal status: INITIAL  4.  Patient will be able to wait for 2-3 hours to urinate due to improved coordination of pelvic floor muscles.  Baseline: urinates every 20 minutes Goal status: INITIAL  5.  Patient able to fully empty her bladder due to improve pelvic floor coordination.  Baseline: Not able to fully empty her bladder all the time.  Goal status: INITIAL    PLAN: PT FREQUENCY: 1x/week  PT DURATION: 12 weeks  PLANNED INTERVENTIONS: Therapeutic exercises, Therapeutic activity, Neuromuscular re-education, Patient/Family education, Joint manipulation, Joint mobilization, Dry Needling, Electrical stimulation, Spinal mobilization, Cryotherapy, Moist heat, Biofeedback, and Manual therapy  PLAN FOR NEXT SESSION: Assess the pelvic floor further, correct pelvis, work on restrictions in the pelvic floor, hip strength   Earlie Counts, PT 08/05/21 3:28 PM PHYSICAL THERAPY DISCHARGE SUMMARY  Visits from Start of Care: 1  Current functional level related to goals / functional outcomes: See above.    Remaining deficits: See above. Patient no showed for her last 3 visits.    Education / Equipment: None   Patient agrees to discharge. Patient goals were  not met. Patient is being discharged due to not returning since the last visit. Thank you for referral. Earlie Counts, PT 09/01/21 9:21 AM

## 2021-08-11 ENCOUNTER — Ambulatory Visit (INDEPENDENT_AMBULATORY_CARE_PROVIDER_SITE_OTHER): Payer: Medicaid Other | Admitting: Licensed Clinical Social Worker

## 2021-08-11 DIAGNOSIS — F419 Anxiety disorder, unspecified: Secondary | ICD-10-CM

## 2021-08-12 NOTE — BH Specialist Note (Signed)
Integrated Behavioral Health via Telemedicine Visit  08/12/2021 Tonya Walls [id number]  Number of Integrated Behavioral Health Clinician visits: 1 Session Start time:  1100am Session End time: 1133am Total time in minutes: 33 mins via mychart video   Referring Provider: Harlon Flor  Patient/Family location: Home  [facility] - Closed Provider location: [facility]  All persons participating in visit: Tonya Walls and LCSW A. Lawerance Matsuo  Types of Service: Individual psychotherapy and Video visit  I connected with Nimrat L Menzie and/or Malani L Iseman's n/a via  Telephone or Video Enabled Telemedicine Application  (Video is Caregility application) and verified that I am speaking with the correct person using two identifiers. Discussed confidentiality: Yes   I discussed the limitations of telemedicine and the availability of in person appointments.  Discussed there is a possibility of technology failure and discussed alternative modes of communication if that failure occurs.  I discussed that engaging in this telemedicine visit, they consent to the provision of behavioral healthcare and the services will be billed under their insurance.  Patient and/or legal guardian expressed understanding and consented to Telemedicine visit: Yes   Presenting Concerns: Patient and/or family reports the following symptoms/concerns: anxiety Duration of problem: over one year ; Severity of problem: mild  Patient and/or Family's Strengths/Protective Factors: Concrete supports in place (healthy food, safe environments, etc.)  Goals Addressed: Patient will:  Reduce symptoms of: anxiety   Increase knowledge and/or ability of: coping skills and stress reduction   Demonstrate ability to: Increase healthy adjustment to current life circumstances  Progress towards Goals: Ongoing  Interventions: Interventions utilized:  Supportive Counseling Standardized Assessments completed: n/a  Patient and/or Family Response: Tonya responded  well to visit   Assessment: Patient currently experiencing general anxiety disorder.   Patient may benefit from integrated behavioral health.  Plan: Follow up with behavioral health clinician on : 09/11/2021 Behavioral recommendations: Prioritizde rest, delegate task to reduce stress and burnout, keep medical appts, deep breathing exercise and relaxation technique  Referral(s): Integrated [employer] (In Clinic)  I discussed the assessment and treatment plan with the patient and/or parent/guardian. They were provided an opportunity to ask questions and all were answered. They agreed with the plan and demonstrated an understanding of the instructions.   They were advised to call back or seek an in-person evaluation if the symptoms worsen or if the condition fails to improve as anticipated.  Gwyndolyn Saxon, LCSW

## 2021-08-19 ENCOUNTER — Telehealth: Payer: Self-pay | Admitting: Physical Therapy

## 2021-08-19 ENCOUNTER — Encounter: Payer: Medicaid Other | Admitting: Physical Therapy

## 2021-08-19 NOTE — Telephone Encounter (Signed)
Called patient about her missed appointment today at 42. Left a message. Tonya Walls, PT @6 /09/2021@ 11:42 AM

## 2021-08-26 ENCOUNTER — Ambulatory Visit: Payer: Medicaid Other | Attending: Medical | Admitting: Physical Therapy

## 2021-08-26 ENCOUNTER — Telehealth: Payer: Self-pay | Admitting: Physical Therapy

## 2021-08-26 DIAGNOSIS — M6281 Muscle weakness (generalized): Secondary | ICD-10-CM | POA: Insufficient documentation

## 2021-08-26 DIAGNOSIS — R102 Pelvic and perineal pain: Secondary | ICD-10-CM | POA: Insufficient documentation

## 2021-08-26 NOTE — Telephone Encounter (Signed)
Called patient about her missed appointment today at 9:30. She forgot to call to cancel due to not having a [employer] administrator. Therapist educated patient about our no show/cancellation policy. She is aware that she no showed last visit and if she no shows next she will be discharged.  Eulis Foster, PT @6 /14/2023@ 10:08 AM

## 2021-08-31 ENCOUNTER — Telehealth: Payer: Self-pay | Admitting: Physical Therapy

## 2021-08-31 ENCOUNTER — Ambulatory Visit: Payer: Medicaid Other | Admitting: Physical Therapy

## 2021-08-31 NOTE — Telephone Encounter (Signed)
Called patient about her missed appointment today at 11:45. Left a message.  Eulis Foster, PT @6 /19/2023@ 12:03 PM

## 2021-09-08 ENCOUNTER — Ambulatory Visit (HOSPITAL_BASED_OUTPATIENT_CLINIC_OR_DEPARTMENT_OTHER): Payer: Medicaid Other | Admitting: Nurse Practitioner

## 2021-09-09 ENCOUNTER — Ambulatory Visit: Payer: Medicaid Other | Admitting: Physical Therapy

## 2021-09-10 ENCOUNTER — Ambulatory Visit: Payer: Medicaid Other | Admitting: Family Medicine

## 2021-09-11 ENCOUNTER — Ambulatory Visit (INDEPENDENT_AMBULATORY_CARE_PROVIDER_SITE_OTHER): Payer: Medicaid Other | Admitting: Licensed Clinical Social Worker

## 2021-09-11 DIAGNOSIS — F419 Anxiety disorder, unspecified: Secondary | ICD-10-CM

## 2021-09-14 NOTE — BH Specialist Note (Signed)
Integrated Behavioral Health via Telemedicine Visit  09/14/2021 KESLEY MULLENS [id number]  Number of Integrated Behavioral Health Clinician visits: 2 Session Start time: 930am  Session End time: 1002am Total time in minutes: 32 mins via phone per pt request feeding newborns   Referring Provider: Magnus Sinning Patient/Family location: Home  [facility] Provider location: [facility]  All persons participating in visit: Pt A Souter and LCSW A. Geniene List  Types of Service: Individual psychotherapy and Telephone visit  I connected with Jolissa L Hoen and/or Evangelia L Snader's n/a via  Telephone or Video Enabled Telemedicine Application  (Video is Caregility application) and verified that I am speaking with the correct person using two identifiers. Discussed confidentiality: Yes   I discussed the limitations of telemedicine and the availability of in person appointments.  Discussed there is a possibility of technology failure and discussed alternative modes of communication if that failure occurs.  I discussed that engaging in this telemedicine visit, they consent to the provision of behavioral healthcare and the services will be billed under their insurance.  Patient and/or legal guardian expressed understanding and consented to Telemedicine visit: Yes   Presenting Concerns: Patient and/or family reports the following symptoms/concerns: anxiety Duration of problem: over one year; Severity of problem: mild  Patient and/or Family's Strengths/Protective Factors: Concrete supports in place (healthy food, safe environments, etc.)  Goals Addressed: Patient will:  Reduce symptoms of: anxiety   Increase knowledge and/or ability of: coping skills   Demonstrate ability to: Increase healthy adjustment to current life circumstances  Progress towards Goals: Ongoing  Interventions: Interventions utilized:  Supportive Counseling Standardized Assessments completed: Not Needed  Patient and/or Family Response: Ms.  Rahe responded well to visit   Assessment: Patient currently experiencing .   Patient may benefit from postpartum anxiety.  Plan: Follow up with behavioral health clinician on : as needed  Behavioral recommendations: Prioritize rest, delegate task and communicate needs with partner for added support, let willing and able (trusted family members) help when offered or ask. Self care to boost mood and decrease anxiety symptoms  Referral(s): Integrated [employer] (In Clinic)  I discussed the assessment and treatment plan with the patient and/or parent/guardian. They were provided an opportunity to ask questions and all were answered. They agreed with the plan and demonstrated an understanding of the instructions.   They were advised to call back or seek an in-person evaluation if the symptoms worsen or if the condition fails to improve as anticipated.  Gwyndolyn Saxon, LCSW

## 2021-09-16 ENCOUNTER — Ambulatory Visit: Payer: Medicaid Other | Admitting: Physical Therapy

## 2021-09-23 ENCOUNTER — Ambulatory Visit: Payer: Medicaid Other | Admitting: Physical Therapy

## 2021-09-30 ENCOUNTER — Encounter: Payer: Medicaid Other | Admitting: Physical Therapy

## 2021-10-14 ENCOUNTER — Ambulatory Visit: Payer: Medicaid Other | Admitting: Physical Therapy

## 2021-10-21 ENCOUNTER — Ambulatory Visit: Payer: Medicaid Other | Admitting: Physical Therapy

## 2021-10-26 ENCOUNTER — Ambulatory Visit (HOSPITAL_BASED_OUTPATIENT_CLINIC_OR_DEPARTMENT_OTHER): Payer: Medicaid Other | Admitting: Nurse Practitioner

## 2021-11-03 ENCOUNTER — Ambulatory Visit: Payer: Medicaid Other | Admitting: Medical

## 2021-11-04 ENCOUNTER — Encounter (HOSPITAL_BASED_OUTPATIENT_CLINIC_OR_DEPARTMENT_OTHER): Payer: Self-pay | Admitting: Nurse Practitioner

## 2021-12-29 ENCOUNTER — Ambulatory Visit (INDEPENDENT_AMBULATORY_CARE_PROVIDER_SITE_OTHER): Payer: Medicaid Other | Admitting: *Deleted

## 2021-12-29 DIAGNOSIS — Z3201 Encounter for pregnancy test, result positive: Secondary | ICD-10-CM | POA: Diagnosis not present

## 2021-12-29 DIAGNOSIS — Z3200 Encounter for pregnancy test, result unknown: Secondary | ICD-10-CM

## 2021-12-29 LAB — POCT PREGNANCY, URINE: Preg Test, Ur: POSITIVE — AB

## 2021-12-29 NOTE — Patient Instructions (Signed)
Prenatal Care Providers           [facility]  [address] [phone]  [facility]   [address]  [phone]  [facility] [place]       [address] [phone]            [facility]     [address] [phone]          [facility] @ [place]   [address] [phone]  [facility]  [address] [phone]     [facility])  [address]   [phone]     [facility]  Phone: [phone]  [facility]  Phone: [phone]  [facility] Phone: [phone]  [facility] Phone: [phone]  [facility] Phone: [phone]  [facility] Phone: [phone]  [facility]  Phone: [phone]  

## 2021-12-29 NOTE — Progress Notes (Signed)
Patient dropped off urine for pregnancy test which was positive. I called Tonya Walls and informed her pregnancy test was positive and I can confirm she is pregnant. She reports sure , regular LMP of 09/23/21 which gives her EDD 06/30/22 and confirms she is [redacted]w[redacted]d pregnant. She is already taking PNV and no other meds. I instructed her to start prenatal care with provider of her choice and she prefers to go here again. I discussed her options and she choosed [facility]. I transferred her to front desk to schedule new ob visit. I explained she will see her centering[facility]regnancy prenatal visit  appointments  in [place]. She voices understanding. Staci Acosta

## 2022-01-20 ENCOUNTER — Other Ambulatory Visit: Payer: Self-pay

## 2022-01-20 ENCOUNTER — Other Ambulatory Visit (HOSPITAL_COMMUNITY)
Admission: RE | Admit: 2022-01-20 | Discharge: 2022-01-20 | Disposition: A | Discharge status: Home / Self Care | Payer: Medicaid Other | Source: Ambulatory Visit | Attending: Family Medicine | Admitting: Family Medicine

## 2022-01-20 ENCOUNTER — Ambulatory Visit (INDEPENDENT_AMBULATORY_CARE_PROVIDER_SITE_OTHER): Payer: Medicaid Other

## 2022-01-20 ENCOUNTER — Encounter: Payer: Self-pay | Admitting: *Deleted

## 2022-01-20 VITALS — BP 125/96 | HR 111 | Wt 309.7 lb

## 2022-01-20 DIAGNOSIS — O0990 Supervision of high risk pregnancy, unspecified, unspecified trimester: Secondary | ICD-10-CM | POA: Diagnosis present

## 2022-01-20 MED ORDER — BLOOD PRESSURE KIT DEVI: 1.0000 | 0 refills | Status: DC | PRN

## 2022-01-20 MED ORDER — GOJJI WEIGHT SCALE MISC: 1.0000 | 0 refills | Status: DC | PRN

## 2022-01-20 NOTE — Progress Notes (Signed)
New OB Intake  Patient came in person for her new OB intake.  I discussed the limitations, risks, security and privacy concerns of performing an evaluation and management service by telephone and the availability of in person appointments. I also discussed with the patient that there may be a patient responsible charge related to this service. The patient expressed understanding and agreed to proceed.  I explained I am completing New OB Intake today. We discussed EDD of 06/30/2022 that is based on LMP of 09/23/2021. Pt is G3/P1103. I reviewed her allergies, medications, Medical/Surgical/OB history, and appropriate screenings. I informed her of [facility] services. [facility] information placed in AVS. Based on history, this is a high risk pregnancy.  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 Patient given information for [facility] Healthy Baby website for more information about [facility]. Patient is not interested in water birth. Offered upcoming OB visit with CNM to discuss further.  MyChart/Babyscripts MyChart access verified. I explained pt will have some visits in office and some virtually. Babyscripts instructions given and order placed. Patient verifies receipt of registration text/e-mail. Account successfully created and app downloaded.  Blood Pressure Cuff/Weight Scale Blood pressure cuff ordered for patient to pick-up from [employer]. Explained after first prenatal appt pt will check weekly and document in Babyscripts. Patient does not have weight scale; order sent to [facility], patient may track weight weekly in  Babyscripts.  Anatomy US Explained first scheduled [place] will be around 19 weeks. Anatomy US scheduled for 02/17/2022 at 2:30 PM. Pt notified to arrive at 2:15 PM.  Labs Discussed [employer] genetic screening with patient. Patient had [employer] genetic screening completed on 11/03/2020; negative. Patient had her initial ob labs, Panorama genetic screening, GC/CH, AFP, Hemoglobin A1C drawn.   COVID Vaccine Patient has not had COVID vaccine.   Is patient a [facility] candidate?  Declined Declined due to Childcare    Is patient a Mom+Baby Combined Care candidate?  Declined   If accepted, Mom+Baby staff notified  Social Determinants of Health Food Insecurity: Patient denies food insecurity. WIC Referral: Patient is interested in referral to [facility].  Transportation: Patient denies transportation needs. Childcare: Discussed no children allowed at ultrasound appointments. Offered childcare services; patient declines childcare services at this time.  First visit review I reviewed new OB appt with patient. I explained they will have a provider visit that includes pap smear and pelvic exam. Explained pt will be seen by Hermina Staggers MD at first visit; encounter routed to appropriate provider. Explained that patient will be seen by pregnancy navigator following visit with provider.   Vidal Schwalbe, [place] 01/20/2022  8:41 AM

## 2022-01-21 LAB — GC/CHLAMYDIA PROBE AMP (~~LOC~~) NOT AT ARMC
Chlamydia: NEGATIVE
Comment: NEGATIVE
Comment: NORMAL
Neisseria Gonorrhea: NEGATIVE

## 2022-01-22 LAB — AFP, SERUM, OPEN SPINA BIFIDA
AFP MoM: 2.19
AFP Value: 61.7 ng/mL
Gest. Age on Collection Date: 17 weeks
Maternal Age At EDD: 29.9 yr
OSBR Risk 1 IN: 1069
Test Results:: NEGATIVE
Weight: 310 [lb_av]

## 2022-01-22 LAB — CBC/D/PLT+RPR+RH+ABO+RUBIGG...
Antibody Screen: NEGATIVE
Basophils Absolute: 0 10*3/uL (ref 0.0–0.2)
Basos: 0 %
EOS (ABSOLUTE): 0.1 10*3/uL (ref 0.0–0.4)
Eos: 1 %
HCV Ab: NONREACTIVE
HIV Screen 4th Generation wRfx: NONREACTIVE
Hematocrit: 28.2 % — ABNORMAL LOW (ref 34.0–46.6)
Hemoglobin: 8.5 g/dL — ABNORMAL LOW (ref 11.1–15.9)
Hepatitis B Surface Ag: NEGATIVE
Immature Grans (Abs): 0 10*3/uL (ref 0.0–0.1)
Immature Granulocytes: 0 %
Lymphocytes Absolute: 1.3 10*3/uL (ref 0.7–3.1)
Lymphs: 18 %
MCH: 23.4 pg — ABNORMAL LOW (ref 26.6–33.0)
MCHC: 30.1 g/dL — ABNORMAL LOW (ref 31.5–35.7)
MCV: 78 fL — ABNORMAL LOW (ref 79–97)
Monocytes Absolute: 0.4 10*3/uL (ref 0.1–0.9)
Monocytes: 5 %
Neutrophils Absolute: 5.5 10*3/uL (ref 1.4–7.0)
Neutrophils: 76 %
Platelets: 337 10*3/uL (ref 150–450)
RBC: 3.64 x10E6/uL — ABNORMAL LOW (ref 3.77–5.28)
RDW: 16.4 % — ABNORMAL HIGH (ref 11.7–15.4)
RPR Ser Ql: NONREACTIVE
Rh Factor: POSITIVE
Rubella Antibodies, IGG: 4.74 index (ref >0.99)
WBC: 7.3 10*3/uL (ref 3.4–10.8)

## 2022-01-22 LAB — HEMOGLOBIN A1C
Est. average glucose Bld gHb Est-mCnc: 94 mg/dL
Hgb A1c MFr Bld: 4.9 % (ref 4.8–5.6)

## 2022-01-22 LAB — URINE CULTURE, OB REFLEX

## 2022-01-22 LAB — HCV INTERPRETATION

## 2022-01-22 LAB — CULTURE, OB URINE

## 2022-01-28 ENCOUNTER — Ambulatory Visit (INDEPENDENT_AMBULATORY_CARE_PROVIDER_SITE_OTHER): Payer: Medicaid Other | Admitting: General Practice

## 2022-01-28 ENCOUNTER — Other Ambulatory Visit: Payer: Self-pay

## 2022-01-28 DIAGNOSIS — O30002 Twin pregnancy, unspecified number of placenta and unspecified number of amniotic sacs, second trimester: Secondary | ICD-10-CM

## 2022-01-28 NOTE — Progress Notes (Signed)
Patient came into office for informal ultrasound to evaluate for multiple gestation based off panorama test results. Patient reports certain LMP in July and has been feeling movement similar to her last twin pregnancy. Two fetuses with dividing membrane seen on ultrasound. Two FHRs visualized with femur length around [redacted]w[redacted]d confirming LMP dating. Patient will return to office on 11/20 for new OB appt. Change authorization form sent to [employer] to run test as ongoing twin pregnancy. MFM will call patient with new ultrasound appt.   Chase Caller RN BSN 01/28/22

## 2022-02-01 ENCOUNTER — Other Ambulatory Visit: Payer: Self-pay

## 2022-02-01 ENCOUNTER — Other Ambulatory Visit (HOSPITAL_COMMUNITY)
Admission: RE | Admit: 2022-02-01 | Discharge: 2022-02-01 | Disposition: A | Discharge status: Home / Self Care | Payer: Medicaid Other | Source: Ambulatory Visit | Attending: Obstetrics and Gynecology | Admitting: Obstetrics and Gynecology

## 2022-02-01 ENCOUNTER — Ambulatory Visit (INDEPENDENT_AMBULATORY_CARE_PROVIDER_SITE_OTHER): Payer: Medicaid Other | Admitting: Obstetrics and Gynecology

## 2022-02-01 ENCOUNTER — Encounter: Payer: Self-pay | Admitting: Obstetrics and Gynecology

## 2022-02-01 VITALS — BP 126/76 | HR 119 | Wt 316.0 lb

## 2022-02-01 DIAGNOSIS — Z1151 Encounter for screening for human papillomavirus (HPV): Secondary | ICD-10-CM | POA: Insufficient documentation

## 2022-02-01 DIAGNOSIS — O30002 Twin pregnancy, unspecified number of placenta and unspecified number of amniotic sacs, second trimester: Secondary | ICD-10-CM | POA: Insufficient documentation

## 2022-02-01 DIAGNOSIS — O0990 Supervision of high risk pregnancy, unspecified, unspecified trimester: Secondary | ICD-10-CM

## 2022-02-01 DIAGNOSIS — Q513 Bicornate uterus: Secondary | ICD-10-CM | POA: Diagnosis not present

## 2022-02-01 DIAGNOSIS — Z3A18 18 weeks gestation of pregnancy: Secondary | ICD-10-CM

## 2022-02-01 DIAGNOSIS — I158 Other secondary hypertension: Secondary | ICD-10-CM

## 2022-02-01 DIAGNOSIS — Z8632 Personal history of gestational diabetes: Secondary | ICD-10-CM | POA: Diagnosis not present

## 2022-02-01 DIAGNOSIS — O30043 Twin pregnancy, dichorionic/diamniotic, third trimester: Secondary | ICD-10-CM | POA: Insufficient documentation

## 2022-02-01 MED ORDER — ASPIRIN 81 MG PO TBEC: 81.0000 mg | DELAYED_RELEASE_TABLET | Freq: Every day | ORAL | 2 refills | Status: DC

## 2022-02-01 MED ORDER — FAMOTIDINE 20 MG PO TABS: 20.0000 mg | ORAL_TABLET | Freq: Every day | ORAL | 3 refills | Status: DC

## 2022-02-01 NOTE — Patient Instructions (Signed)
Multiple Pregnancy Multiple pregnancy means that a woman is carrying more than one baby at a time. She may be pregnant with twins, triplets, or more. Most multiple pregnancies are twins. It is rare for a woman to get pregnant naturally with triplets or more (higher-order multiples). Multiple pregnancies come with more risks than single pregnancies. A woman with a multiple pregnancy is more likely to have certain problems during her pregnancy. How does a multiple pregnancy happen? A multiple pregnancy happens when: Your body releases more than one egg at a time. Then each egg is fertilized by a different sperm. This is the most common type of multiple pregnancy. This is more likely to happen if you are older when you become pregnant. The twins or multiples produced this way are called fraternal. They are no more alike than non-multiple siblings. One sperm fertilizes one egg. The egg then divides into more than one embryo. The twins or multiples produced this way are called identical. They are always the same gender. They also look a lot alike. Who is most likely to have a multiple pregnancy? A multiple pregnancy is more likely to happen if: You have had fertility treatment. This is especially true if you used fertility medicines. You are older than 29 years of age. You have already had four or more children. You have had a previous multiple pregnancy. You have a family history of multiple pregnancy. How is a multiple pregnancy diagnosed? A multiple pregnancy may be diagnosed based on symptoms. These may include: Rapid weight gain in the first 3 months of pregnancy (first trimester). More severe nausea and breast tendernessthan expected in a single pregnancy. A larger uterus than what is normal for the stage of pregnancy. You may also have tests, including: Blood tests to detect a higher-than-expected level of human chorionic gonadotropin (hCG). hCG is a hormone that your body makes in early  pregnancy. An ultrasound exam. This is used to confirm you are carrying multiples. What risks come with multiple pregnancy? A multiple pregnancy puts you at higher risk for certain problems during or after your pregnancy. These include: Delivering your babies before your due date (preterm birth). A full-term pregnancy lasts for at least 37 weeks. Babies born before 37 weeks may be more likely to have breathing problems, trouble feeding, and physical and learning disabilities. Gestational diabetes. This is high blood sugar only during pregnancy. Preeclampsia. This is a serious condition that causes high blood pressure and headaches during pregnancy. Too much blood loss after childbirth (postpartum hemorrhage). Postpartum depression. Babies with low birth weight. How will having a multiple pregnancy affect my care? Your health care team will monitor you more closely. You may need more prenatal visits and ultrasounds. These will ensure that you and your babies are healthy. Follow these instructions at home: Eating and drinking Improve your nutrition and increase your calorie intake. Follow instructions from your health care provider about weight gain. You may need to gain extra weight when you are pregnant with multiples. Eat healthy snacks often throughout the day. This will add calories and reduce nausea. Do not drink alcohol. Drink enough fluid to keep your urine pale yellow. Take prenatal vitamins. Ask your health care provider what vitamins are right for you. Activity You may need to limit your activities and how much you exercise. This will depend on how your pregnancy progresses and if you have any complications. Follow instructions from your health care provider about what activities are safe for you. Your health care provider  may instruct you to: Rest often. Avoid activities, exercise, and work that take a lot of effort. General instructions Do not use any products that contain  nicotine or tobacco. These products include cigarettes, chewing tobacco, and vaping devices, such as e-cigarettes. If you need help quitting, ask your health care provider. Do not use illegal drugs. Take over-the-counter and prescription medicines only as told by your health care provider. Arrange for extra help around the house. Keep all follow-up visits and all prenatal visits due to the risks that come with a multiple pregnancy. Where to find more information [employer]: acog.org March of Dimes: marchofdimes.org Contact a health care provider if: You feel dizzy. You have nausea, vomiting, or diarrhea that does not go away. You feel depressed or have other emotions that interfere with your normal activities. You notice increased swelling in your face, hands, legs, or ankles. You have a fever. You have pain when you urinate or bad-smelling discharge from your vagina. You have a severe headache, with or without changes in vision. Get help right away if: You have blood or fluid leaking from your vagina. You feel cramping or pressure in your pelvis. You have pain in your abdomen or lower back. You are having regular contractions. You have chest pain or shortness of breath. Your babies move less often, or do not move at all. Summary Multiple pregnancy means that a woman is carrying more than one baby at a time. Multiple pregnancy puts you at higher risk for certain problems, such as preterm birth, gestational diabetes, or too much blood loss after childbirth. You will be monitored by your health care team. You may have more testing done. Do not use any products with tobacco or nicotine, drink alcohol, or use any illegal drugs. Let your health care provider know if you have any problems during your pregnancy. Keep all prenatal visits. This information is not intended to replace advice given to you by your health care provider. Make sure you discuss any  questions you have with your health care provider. Document Revised: 06/12/2021 Document Reviewed: 06/12/2021 Elsevier Patient Education  2023 [employer].

## 2022-02-01 NOTE — Progress Notes (Signed)
Subjective:  Tonya Walls is a 31 y.o. G3P1103 at [redacted]w[redacted]d being seen today for her first OB visit. H/O CHTN, no meds. Twin gestation, formal scan has been ordered H/O term SVD twins. EDD by LMP.    She is currently monitored for the following issues for this low-risk pregnancy and has Bicornate uterus; BMI 50.0-59.9, adult (HCC); History of twin pregnancy in prior pregnancy; History of gestational diabetes; Hypertension; and Supervision of high risk pregnancy, antepartum on their problem list.  Patient reports no complaints.  Contractions: Not present. Vag. Bleeding: None.  Movement: Present. Denies leaking of fluid.   The following portions of the patient's history were reviewed and updated as appropriate: allergies, current medications, past family history, past medical history, past social history, past surgical history and problem list. Problem list updated.  Objective:   Vitals:   02/01/22 1517  BP: 126/76  Pulse: (!) 119  Weight: (!) 316 lb (143.3 kg)    Fetal Status: Fetal Heart Rate (bpm): 145   Movement: Present     General:  Alert, oriented and cooperative. Patient is in no acute distress.  Skin: Skin is warm and dry. No rash noted.   Cardiovascular: Normal heart rate noted  Respiratory: Normal respiratory effort, no problems with respiration noted  Abdomen: Soft, gravid, appropriate for gestational age. Pain/Pressure: Absent     Pelvic:  Cervical exam performed        Extremities: Normal range of motion.  Edema: None  Mental Status: Normal mood and affect. Normal behavior. Normal judgment and thought content.   Urinalysis:      Assessment and Plan:  Pregnancy: G3P1103 at [redacted]w[redacted]d  1. Supervision of high risk pregnancy, antepartum Prenatal care and labs reviewed with pt Genetic testing discussed Anatomy scan ordered  2. Other secondary hypertension BP stable without meds Start BASA Serial growth scans and antenatal testing as per protocol  3. Bicornate uterus U/S  ordered  4. History of gestational diabetes Nl A1c  5. Twin gestation Formal scan ordered Serial growth scans and antenatal testing as per protocol  Preterm labor symptoms and general obstetric precautions including but not limited to vaginal bleeding, contractions, leaking of fluid and fetal movement were reviewed in detail with the patient. Please refer to After Visit Summary for other counseling recommendations.  Return in about 4 weeks (around 03/01/2022) for OB visit, face to face, MD only.   Hermina Staggers, MD

## 2022-02-02 LAB — COMPREHENSIVE METABOLIC PANEL
ALT: 11 IU/L (ref 0–32)
AST: 11 IU/L (ref 0–40)
Albumin/Globulin Ratio: 1 — ABNORMAL LOW (ref 1.2–2.2)
Albumin: 3.4 g/dL — ABNORMAL LOW (ref 4.0–5.0)
Alkaline Phosphatase: 94 IU/L (ref 44–121)
BUN/Creatinine Ratio: 18 (ref 9–23)
BUN: 9 mg/dL (ref 6–20)
Bilirubin Total: <0.2 mg/dL (ref 0.0–1.2)
CO2: 17 mmol/L — ABNORMAL LOW (ref 20–29)
Calcium: 8.8 mg/dL (ref 8.7–10.2)
Chloride: 105 mmol/L (ref 96–106)
Creatinine, Ser: 0.49 mg/dL — ABNORMAL LOW (ref 0.57–1.00)
Globulin, Total: 3.5 g/dL (ref 1.5–4.5)
Glucose: 124 mg/dL — ABNORMAL HIGH (ref 70–99)
Potassium: 3.7 mmol/L (ref 3.5–5.2)
Sodium: 138 mmol/L (ref 134–144)
Total Protein: 6.9 g/dL (ref 6.0–8.5)
eGFR: 131 mL/min/{1.73_m2} (ref >59)

## 2022-02-02 LAB — PANORAMA PRENATAL TEST FULL PANEL:PANORAMA TEST PLUS 5 ADDITIONAL MICRODELETIONS
FETAL FRACTION SECOND FETUS: 3.1
FETAL FRACTION: 3.8

## 2022-02-02 LAB — PROTEIN / CREATININE RATIO, URINE
Creatinine, Urine: 288.5 mg/dL
Protein, Ur: 121.9 mg/dL
Protein/Creat Ratio: 423 mg/g creat — ABNORMAL HIGH (ref 0–200)

## 2022-02-03 ENCOUNTER — Encounter: Payer: Self-pay | Admitting: Obstetrics and Gynecology

## 2022-02-03 ENCOUNTER — Telehealth: Payer: Self-pay

## 2022-02-03 LAB — CYTOLOGY - PAP: Diagnosis: HIGH — AB

## 2022-02-03 NOTE — Telephone Encounter (Signed)
CMA called patient to inform on panorama result. Patient verified DOB and Name.  Patient is aware she will need to get her panorama test redraw due to insufficient DNA.  Scheduled patient a nurse visit for panorama lab draw redraw only on 02/10/2022 9:20 AM .   Patient is aware of date and time.

## 2022-02-03 NOTE — Telephone Encounter (Signed)
-----   Message from Hermina Staggers, MD sent at 02/02/2022 10:22 PM EST ----- Please have pt return for redraw of her Panorama. Thanks Casimiro Needle

## 2022-02-10 ENCOUNTER — Ambulatory Visit (INDEPENDENT_AMBULATORY_CARE_PROVIDER_SITE_OTHER): Payer: Medicaid Other | Admitting: General Practice

## 2022-02-10 ENCOUNTER — Other Ambulatory Visit: Payer: Self-pay

## 2022-02-10 VITALS — BP 120/65 | HR 111 | Ht 63.0 in | Wt 316.0 lb

## 2022-02-10 DIAGNOSIS — O0990 Supervision of high risk pregnancy, unspecified, unspecified trimester: Secondary | ICD-10-CM

## 2022-02-10 NOTE — Progress Notes (Signed)
Patient here for panorama redraw- lab collected today. Patient will return 12/19 for OB visit.

## 2022-02-17 ENCOUNTER — Encounter: Payer: Self-pay | Admitting: *Deleted

## 2022-02-17 ENCOUNTER — Ambulatory Visit: Payer: Medicaid Other | Admitting: *Deleted

## 2022-02-17 ENCOUNTER — Other Ambulatory Visit: Payer: Self-pay | Admitting: *Deleted

## 2022-02-17 ENCOUNTER — Ambulatory Visit: Payer: Medicaid Other

## 2022-02-17 ENCOUNTER — Ambulatory Visit: Payer: Medicaid Other | Attending: Obstetrics and Gynecology

## 2022-02-17 VITALS — BP 130/72 | HR 118

## 2022-02-17 DIAGNOSIS — O99212 Obesity complicating pregnancy, second trimester: Secondary | ICD-10-CM

## 2022-02-17 DIAGNOSIS — O30002 Twin pregnancy, unspecified number of placenta and unspecified number of amniotic sacs, second trimester: Secondary | ICD-10-CM | POA: Diagnosis present

## 2022-02-17 DIAGNOSIS — E669 Obesity, unspecified: Secondary | ICD-10-CM

## 2022-02-17 DIAGNOSIS — O09212 Supervision of pregnancy with history of pre-term labor, second trimester: Secondary | ICD-10-CM

## 2022-02-17 DIAGNOSIS — Z3A21 21 weeks gestation of pregnancy: Secondary | ICD-10-CM

## 2022-02-17 DIAGNOSIS — Z363 Encounter for antenatal screening for malformations: Secondary | ICD-10-CM

## 2022-02-17 DIAGNOSIS — O30042 Twin pregnancy, dichorionic/diamniotic, second trimester: Secondary | ICD-10-CM | POA: Diagnosis not present

## 2022-02-17 DIAGNOSIS — O0990 Supervision of high risk pregnancy, unspecified, unspecified trimester: Secondary | ICD-10-CM | POA: Insufficient documentation

## 2022-02-17 DIAGNOSIS — O09292 Supervision of pregnancy with other poor reproductive or obstetric history, second trimester: Secondary | ICD-10-CM

## 2022-02-17 DIAGNOSIS — Z362 Encounter for other antenatal screening follow-up: Secondary | ICD-10-CM

## 2022-02-17 DIAGNOSIS — R638 Other symptoms and signs concerning food and fluid intake: Secondary | ICD-10-CM

## 2022-02-17 DIAGNOSIS — O09892 Supervision of other high risk pregnancies, second trimester: Secondary | ICD-10-CM

## 2022-02-17 LAB — PANORAMA PRENATAL TEST FULL PANEL:PANORAMA TEST PLUS 5 ADDITIONAL MICRODELETIONS
FETAL FRACTION SECOND FETUS: 3.5
FETAL FRACTION: 3.8

## 2022-02-18 ENCOUNTER — Telehealth: Payer: Self-pay | Admitting: *Deleted

## 2022-02-18 NOTE — Telephone Encounter (Signed)
Tonya Walls had expressed interest at Pregnancy confirmation nurse visit. We were wating to see if she had Di-Di twins which was done 02/17/22. I called Tonya Walls back to offer centering with group that meets Thursday am and she says she would love to, but cannot due to her work- she can't be off Thursdays. Due to her being 21 weeks now we discussed there is not another group that she could join. We discussed we will leave her in Traditional with appointments as scheduled. Nancy Fetter

## 2022-02-22 ENCOUNTER — Emergency Department (HOSPITAL_COMMUNITY)
Admission: EM | Admit: 2022-02-22 | Discharge: 2022-02-22 | Discharge status: Against Medical Advice | Payer: Medicaid Other | Attending: Emergency Medicine | Admitting: Emergency Medicine

## 2022-02-22 DIAGNOSIS — Z5321 Procedure and treatment not carried out due to patient leaving prior to being seen by health care provider: Secondary | ICD-10-CM | POA: Diagnosis not present

## 2022-02-22 DIAGNOSIS — R079 Chest pain, unspecified: Secondary | ICD-10-CM | POA: Insufficient documentation

## 2022-02-22 DIAGNOSIS — O26892 Other specified pregnancy related conditions, second trimester: Secondary | ICD-10-CM | POA: Insufficient documentation

## 2022-02-22 DIAGNOSIS — Z3A21 21 weeks gestation of pregnancy: Secondary | ICD-10-CM | POA: Insufficient documentation

## 2022-02-22 NOTE — ED Notes (Signed)
Called for triage. No response. 

## 2022-02-22 NOTE — ED Triage Notes (Signed)
Pt BIB [facility] from home c/o centralized CP that happened while she was cooking beans. Pt took 324 ASA and now pain has subsided. Pt is [redacted] weeks pregnant with twins.

## 2022-02-23 ENCOUNTER — Other Ambulatory Visit: Payer: Self-pay

## 2022-02-23 ENCOUNTER — Encounter (HOSPITAL_COMMUNITY): Payer: Self-pay

## 2022-02-23 ENCOUNTER — Emergency Department (HOSPITAL_COMMUNITY)
Admission: EM | Admit: 2022-02-23 | Discharge: 2022-02-23 | Discharge status: Against Medical Advice | Payer: Medicaid Other | Attending: Physician Assistant | Admitting: Physician Assistant

## 2022-02-23 ENCOUNTER — Emergency Department (HOSPITAL_COMMUNITY): Payer: Medicaid Other

## 2022-02-23 DIAGNOSIS — R072 Precordial pain: Secondary | ICD-10-CM | POA: Insufficient documentation

## 2022-02-23 DIAGNOSIS — R079 Chest pain, unspecified: Secondary | ICD-10-CM | POA: Diagnosis not present

## 2022-02-23 DIAGNOSIS — O26892 Other specified pregnancy related conditions, second trimester: Secondary | ICD-10-CM | POA: Diagnosis not present

## 2022-02-23 DIAGNOSIS — Z3A22 22 weeks gestation of pregnancy: Secondary | ICD-10-CM | POA: Diagnosis not present

## 2022-02-23 DIAGNOSIS — Z5321 Procedure and treatment not carried out due to patient leaving prior to being seen by health care provider: Secondary | ICD-10-CM | POA: Insufficient documentation

## 2022-02-23 LAB — CBC
HCT: 28.9 % — ABNORMAL LOW (ref 36.0–46.0)
Hemoglobin: 8.3 g/dL — ABNORMAL LOW (ref 12.0–15.0)
MCH: 23.9 pg — ABNORMAL LOW (ref 26.0–34.0)
MCHC: 28.7 g/dL — ABNORMAL LOW (ref 30.0–36.0)
MCV: 83 fL (ref 80.0–100.0)
Platelets: 302 10*3/uL (ref 150–400)
RBC: 3.48 MIL/uL — ABNORMAL LOW (ref 3.87–5.11)
RDW: 18 % — ABNORMAL HIGH (ref 11.5–15.5)
WBC: 8.9 10*3/uL (ref 4.0–10.5)
nRBC: 0 % (ref 0.0–0.2)

## 2022-02-23 LAB — BASIC METABOLIC PANEL
Anion gap: 9 (ref 5–15)
BUN: 10 mg/dL (ref 6–20)
CO2: 19 mmol/L — ABNORMAL LOW (ref 22–32)
Calcium: 8.9 mg/dL (ref 8.9–10.3)
Chloride: 108 mmol/L (ref 98–111)
Creatinine, Ser: 0.45 mg/dL (ref 0.44–1.00)
GFR, Estimated: >60 mL/min (ref >60)
Glucose, Bld: 115 mg/dL — ABNORMAL HIGH (ref 70–99)
Potassium: 3.8 mmol/L (ref 3.5–5.1)
Sodium: 136 mmol/L (ref 135–145)

## 2022-02-23 LAB — HEPATIC FUNCTION PANEL
ALT: 12 U/L (ref 0–44)
AST: 18 U/L (ref 15–41)
Albumin: 2.5 g/dL — ABNORMAL LOW (ref 3.5–5.0)
Alkaline Phosphatase: 80 U/L (ref 38–126)
Bilirubin, Direct: <0.1 mg/dL (ref 0.0–0.2)
Total Bilirubin: 0.2 mg/dL — ABNORMAL LOW (ref 0.3–1.2)
Total Protein: 7.3 g/dL (ref 6.5–8.1)

## 2022-02-23 LAB — HCG, QUANTITATIVE, PREGNANCY: hCG, Beta Chain, Quant, S: 23846 m[IU]/mL — ABNORMAL HIGH (ref <5)

## 2022-02-23 LAB — TROPONIN I (HIGH SENSITIVITY)
Troponin I (High Sensitivity): 5 ng/L (ref <18)
Troponin I (High Sensitivity): 3 ng/L (ref <18)

## 2022-02-23 LAB — LIPASE, BLOOD: Lipase: 27 U/L (ref 11–51)

## 2022-02-23 NOTE — ED Triage Notes (Signed)
Pt arrived POV c/o centralized CP that started yesterday while she was cooking, pt took 324 of ASA and it went away but then came back today. Pt is [redacted] weeks pregnant with twins.

## 2022-02-23 NOTE — ED Provider Triage Note (Signed)
Emergency Medicine Provider Triage Evaluation Note  Tonya Walls , a 31 y.o. female  was evaluated in triage.  Pt complains of chest pain described as substernal chest pressure which began yesterday.  Patient is a G3, P3 currently [redacted] weeks pregnant.  Did try to take 4 aspirin without any improvement in symptoms.  No alleviating or exacerbating factors.  Does endorse shortness of breath however this occurs at rest.  No recent fevers, no cough, no prior cardiac history.  She does have a prior history of preeclampsia.  Review of Systems  Positive: Sob, cp Negative: Fever, cough  Physical Exam  LMP 09/23/2021  Gen:   Awake, no distress   Resp:  Normal effort  MSK:   Moves extremities without difficulty  Other:  1+ pitting edema bilaterally  Medical Decision Making  Medically screening exam initiated at 12:09 PM.  Appropriate orders placed.  Tonya Walls was informed that the remainder of the evaluation will be completed by another provider, this initial triage assessment does not replace that evaluation, and the importance of remaining in the ED until their evaluation is complete.     Claude Manges, PA-C 02/23/22 1213

## 2022-02-23 NOTE — ED Notes (Signed)
Pt called multiple times, no answer 

## 2022-02-23 NOTE — ED Notes (Signed)
Pt called for repeat vitals, no answer 

## 2022-02-24 ENCOUNTER — Telehealth: Payer: Self-pay | Admitting: Family Medicine

## 2022-02-24 NOTE — Telephone Encounter (Signed)
Patients wants to go over test results from ER.

## 2022-02-24 NOTE — Telephone Encounter (Signed)
Called patient and she states she left the ER prior to getting her results. Informed patient that blood work looks pretty normal and her chest x-ray was also normal. Patient verbalized understanding.

## 2022-03-01 ENCOUNTER — Encounter: Payer: Self-pay | Admitting: *Deleted

## 2022-03-02 ENCOUNTER — Ambulatory Visit (INDEPENDENT_AMBULATORY_CARE_PROVIDER_SITE_OTHER): Payer: Medicaid Other | Admitting: Obstetrics & Gynecology

## 2022-03-02 ENCOUNTER — Other Ambulatory Visit: Payer: Self-pay

## 2022-03-02 VITALS — BP 125/75 | HR 115 | Wt 318.1 lb

## 2022-03-02 DIAGNOSIS — Z6841 Body Mass Index (BMI) 40.0 and over, adult: Secondary | ICD-10-CM

## 2022-03-02 DIAGNOSIS — O30002 Twin pregnancy, unspecified number of placenta and unspecified number of amniotic sacs, second trimester: Secondary | ICD-10-CM

## 2022-03-02 DIAGNOSIS — I158 Other secondary hypertension: Secondary | ICD-10-CM

## 2022-03-02 DIAGNOSIS — Q513 Bicornate uterus: Secondary | ICD-10-CM

## 2022-03-02 DIAGNOSIS — Z3A22 22 weeks gestation of pregnancy: Secondary | ICD-10-CM

## 2022-03-02 DIAGNOSIS — Z8632 Personal history of gestational diabetes: Secondary | ICD-10-CM

## 2022-03-02 DIAGNOSIS — O0992 Supervision of high risk pregnancy, unspecified, second trimester: Secondary | ICD-10-CM

## 2022-03-02 DIAGNOSIS — O0990 Supervision of high risk pregnancy, unspecified, unspecified trimester: Secondary | ICD-10-CM

## 2022-03-02 MED ORDER — PANTOPRAZOLE SODIUM 40 MG PO TBEC: 40.0000 mg | DELAYED_RELEASE_TABLET | Freq: Every day | ORAL | 3 refills | Status: DC

## 2022-03-02 NOTE — Progress Notes (Signed)
   PRENATAL VISIT NOTE  Subjective:  Tonya Walls is a 31 y.o. G3P1103 at [redacted]w[redacted]d being seen today for ongoing prenatal care.  She is currently monitored for the following issues for this high-risk pregnancy and has Bicornate uterus; BMI 50.0-59.9, adult (HCC); History of twin pregnancy in prior pregnancy; History of gestational diabetes; Hypertension; Supervision of high risk pregnancy, antepartum; and Twin gestation in second trimester on their problem list.  Patient reports heartburn.  Contractions: Not present. Vag. Bleeding: None.  Movement: Present. Denies leaking of fluid.   The following portions of the patient's history were reviewed and updated as appropriate: allergies, current medications, past family history, past medical history, past social history, past surgical history and problem list.   Objective:   Vitals:   03/02/22 1127  BP: 125/75  Pulse: (!) 115  Weight: (!) 318 lb 1.6 oz (144.3 kg)    Fetal Status: Fetal Heart Rate (bpm): 163/125   Movement: Present     General:  Alert, oriented and cooperative. Patient is in no acute distress.  Skin: Skin is warm and dry. No rash noted.   Cardiovascular: Normal heart rate noted  Respiratory: Normal respiratory effort, no problems with respiration noted  Abdomen: Soft, gravid, appropriate for gestational age.  Pain/Pressure: Present (pain in pelvic area)     Pelvic: Cervical exam deferred        Extremities: Normal range of motion.  Edema: None  Mental Status: Normal mood and affect. Normal behavior. Normal judgment and thought content.   Assessment and Plan:  Pregnancy: G3P1103 at [redacted]w[redacted]d 1. Twin gestation in second trimester, unspecified multiple gestation type F/u US 2 weeks  2. Supervision of high risk pregnancy, antepartum heartburn - pantoprazole (PROTONIX) 40 MG tablet; Take 1 tablet (40 mg total) by mouth daily.  Dispense: 30 tablet; Refill: 3  3. BMI 50.0-59.9, adult (HCC) Body mass index is 56.35 kg/m.   4.  Other secondary hypertension BP wnl  5. History of gestational diabetes 2 r at next visit  6. Bicornate uterus   Preterm labor symptoms and general obstetric precautions including but not limited to vaginal bleeding, contractions, leaking of fluid and fetal movement were reviewed in detail with the patient. Please refer to After Visit Summary for other counseling recommendations.   Return in about 4 weeks (around 03/30/2022).  Future Appointments  Date Time Provider Department Center  03/22/2022 10:15 AM [facility] NURSE [facility]  03/22/2022 10:30 AM [facility] US2 [facility]  03/29/2022 10:55 AM Adam Phenix, MD [facility]  04/14/2022 10:45 AM [facility] NURSE [facility]  04/14/2022 11:00 AM [facility] US1 [facility]    Scheryl Darter, MD

## 2022-03-22 ENCOUNTER — Ambulatory Visit: Payer: Medicaid Other | Admitting: *Deleted

## 2022-03-22 ENCOUNTER — Other Ambulatory Visit: Payer: Self-pay | Admitting: *Deleted

## 2022-03-22 ENCOUNTER — Encounter: Payer: Self-pay | Admitting: *Deleted

## 2022-03-22 ENCOUNTER — Ambulatory Visit: Payer: Medicaid Other | Attending: Maternal & Fetal Medicine

## 2022-03-22 VITALS — BP 135/83 | HR 110

## 2022-03-22 DIAGNOSIS — Z8632 Personal history of gestational diabetes: Secondary | ICD-10-CM | POA: Diagnosis not present

## 2022-03-22 DIAGNOSIS — O09292 Supervision of pregnancy with other poor reproductive or obstetric history, second trimester: Secondary | ICD-10-CM | POA: Diagnosis not present

## 2022-03-22 DIAGNOSIS — O30002 Twin pregnancy, unspecified number of placenta and unspecified number of amniotic sacs, second trimester: Secondary | ICD-10-CM

## 2022-03-22 DIAGNOSIS — O09212 Supervision of pregnancy with history of pre-term labor, second trimester: Secondary | ICD-10-CM | POA: Diagnosis not present

## 2022-03-22 DIAGNOSIS — O0990 Supervision of high risk pregnancy, unspecified, unspecified trimester: Secondary | ICD-10-CM

## 2022-03-22 DIAGNOSIS — E669 Obesity, unspecified: Secondary | ICD-10-CM

## 2022-03-22 DIAGNOSIS — O30042 Twin pregnancy, dichorionic/diamniotic, second trimester: Secondary | ICD-10-CM

## 2022-03-22 DIAGNOSIS — O99212 Obesity complicating pregnancy, second trimester: Secondary | ICD-10-CM | POA: Diagnosis present

## 2022-03-22 DIAGNOSIS — O09893 Supervision of other high risk pregnancies, third trimester: Secondary | ICD-10-CM

## 2022-03-22 DIAGNOSIS — R638 Other symptoms and signs concerning food and fluid intake: Secondary | ICD-10-CM | POA: Diagnosis present

## 2022-03-22 DIAGNOSIS — Z3A25 25 weeks gestation of pregnancy: Secondary | ICD-10-CM

## 2022-03-22 DIAGNOSIS — O30043 Twin pregnancy, dichorionic/diamniotic, third trimester: Secondary | ICD-10-CM

## 2022-03-22 DIAGNOSIS — Z362 Encounter for other antenatal screening follow-up: Secondary | ICD-10-CM | POA: Diagnosis present

## 2022-03-29 ENCOUNTER — Encounter: Payer: Self-pay | Admitting: Obstetrics & Gynecology

## 2022-03-31 ENCOUNTER — Encounter: Payer: Medicaid Other | Admitting: Obstetrics and Gynecology

## 2022-04-08 ENCOUNTER — Encounter: Payer: Self-pay | Admitting: Obstetrics & Gynecology

## 2022-04-08 ENCOUNTER — Ambulatory Visit (INDEPENDENT_AMBULATORY_CARE_PROVIDER_SITE_OTHER): Payer: Medicaid Other | Admitting: Obstetrics & Gynecology

## 2022-04-08 ENCOUNTER — Other Ambulatory Visit: Payer: Self-pay

## 2022-04-08 ENCOUNTER — Other Ambulatory Visit: Payer: Medicaid Other

## 2022-04-08 VITALS — BP 121/78 | HR 105 | Wt 323.9 lb

## 2022-04-08 DIAGNOSIS — I158 Other secondary hypertension: Secondary | ICD-10-CM

## 2022-04-08 DIAGNOSIS — Z6841 Body Mass Index (BMI) 40.0 and over, adult: Secondary | ICD-10-CM

## 2022-04-08 DIAGNOSIS — O30002 Twin pregnancy, unspecified number of placenta and unspecified number of amniotic sacs, second trimester: Secondary | ICD-10-CM

## 2022-04-08 DIAGNOSIS — Z3A28 28 weeks gestation of pregnancy: Secondary | ICD-10-CM

## 2022-04-08 DIAGNOSIS — O0990 Supervision of high risk pregnancy, unspecified, unspecified trimester: Secondary | ICD-10-CM

## 2022-04-08 DIAGNOSIS — O30003 Twin pregnancy, unspecified number of placenta and unspecified number of amniotic sacs, third trimester: Secondary | ICD-10-CM

## 2022-04-08 DIAGNOSIS — Z8759 Personal history of other complications of pregnancy, childbirth and the puerperium: Secondary | ICD-10-CM

## 2022-04-08 DIAGNOSIS — Q513 Bicornate uterus: Secondary | ICD-10-CM

## 2022-04-08 DIAGNOSIS — Z8632 Personal history of gestational diabetes: Secondary | ICD-10-CM

## 2022-04-08 DIAGNOSIS — O0993 Supervision of high risk pregnancy, unspecified, third trimester: Secondary | ICD-10-CM

## 2022-04-08 NOTE — Progress Notes (Signed)
   PRENATAL VISIT NOTE  Subjective:  Tonya Walls is a 32 y.o. G3P1103 at [redacted]w[redacted]d being seen today for ongoing prenatal care.  She is currently monitored for the following issues for this high-risk pregnancy and has Bicornate uterus; BMI 50.0-59.9, adult ([place]); History of twin pregnancy in prior pregnancy; History of gestational diabetes; Hypertension; Supervision of high risk pregnancy, antepartum; and Twin gestation in second trimester on their problem list.  Patient reports no complaints.  Contractions: Irregular (patient did report feeling some regular ctx's last week, but have been intermittent ever since). Vag. Bleeding: None.  Movement: Present. Denies leaking of fluid.   The following portions of the patient's history were reviewed and updated as appropriate: allergies, current medications, past family history, past medical history, past social history, past surgical history and problem list.   Objective:   Vitals:   04/08/22 0904  BP: 121/78  Pulse: (!) 105  Weight: (!) 323 lb 14.4 oz (146.9 kg)    Fetal Status: Fetal Heart Rate (bpm): 153/155   Movement: Present     General:  Alert, oriented and cooperative. Patient is in no acute distress.  Skin: Skin is warm and dry. No rash noted.   Cardiovascular: Normal heart rate noted  Respiratory: Normal respiratory effort, no problems with respiration noted  Abdomen: Soft, gravid, appropriate for gestational age.  Pain/Pressure: Present (pressure in lower abdomen)     Pelvic: Cervical exam deferred        Extremities: Normal range of motion.  Edema: Mild pitting, slight indentation  Mental Status: Normal mood and affect. Normal behavior. Normal judgment and thought content.   Assessment and Plan:  Pregnancy: G3P1103 at [redacted]w[redacted]d 1. Supervision of high risk pregnancy, antepartum Nl BP, f/u US in 1 week  2. Bicornate uterus F/u US Supervision of high risk pregnancy, antepartum  Bicornate uterus  Twin gestation in second  trimester, unspecified multiple gestation type  History of twin pregnancy in prior pregnancy  History of gestational diabetes  BMI 50.0-59.9, adult ([place])  Other secondary hypertension  Preterm labor symptoms and general obstetric precautions including but not limited to vaginal bleeding, contractions, leaking of fluid and fetal movement were reviewed in detail with the patient. Please refer to After Visit Summary for other counseling recommendations.   Return in about 2 weeks (around 04/22/2022).  Future Appointments  Date Time Provider [place]  04/19/2022 10:30 AM [facility] NURSE [facility]  04/19/2022 10:45 AM [facility] US4 [facility]    Emeterio Reeve, MD

## 2022-04-08 NOTE — Addendum Note (Signed)
Addended by: Georgia Lopes on: 04/08/2022 08:29 AM   Modules accepted: Orders

## 2022-04-09 LAB — CBC
Hematocrit: 29.2 % — ABNORMAL LOW (ref 34.0–46.6)
Hemoglobin: 8.8 g/dL — ABNORMAL LOW (ref 11.1–15.9)
MCH: 22.7 pg — ABNORMAL LOW (ref 26.6–33.0)
MCHC: 30.1 g/dL — ABNORMAL LOW (ref 31.5–35.7)
MCV: 75 fL — ABNORMAL LOW (ref 79–97)
Platelets: 270 10*3/uL (ref 150–450)
RBC: 3.88 x10E6/uL (ref 3.77–5.28)
RDW: 15.6 % — ABNORMAL HIGH (ref 11.7–15.4)
WBC: 7.1 10*3/uL (ref 3.4–10.8)

## 2022-04-09 LAB — GLUCOSE TOLERANCE, 2 HOURS W/ 1HR
Glucose, 1 hour: 149 mg/dL (ref 70–179)
Glucose, 2 hour: 137 mg/dL (ref 70–152)
Glucose, Fasting: 93 mg/dL — ABNORMAL HIGH (ref 70–91)

## 2022-04-09 LAB — RPR: RPR Ser Ql: NONREACTIVE

## 2022-04-09 LAB — HIV ANTIBODY (ROUTINE TESTING W REFLEX): HIV Screen 4th Generation wRfx: NONREACTIVE

## 2022-04-14 ENCOUNTER — Ambulatory Visit: Payer: Medicaid Other

## 2022-04-19 ENCOUNTER — Ambulatory Visit: Payer: Medicaid Other

## 2022-04-20 ENCOUNTER — Telehealth: Payer: Self-pay

## 2022-04-20 DIAGNOSIS — O24419 Gestational diabetes mellitus in pregnancy, unspecified control: Secondary | ICD-10-CM

## 2022-04-20 MED ORDER — ACCU-CHEK GUIDE W/DEVICE KIT: 1.0000 | PACK | Freq: Four times a day (QID) | 0 refills | Status: DC

## 2022-04-20 MED ORDER — ACCU-CHEK SOFTCLIX LANCETS MISC: 12 refills | Status: DC

## 2022-04-20 MED ORDER — GLUCOSE BLOOD VI STRP: ORAL_STRIP | 12 refills | Status: DC

## 2022-04-20 NOTE — Telephone Encounter (Signed)
Called pt to discuss results of GTT. Supplies sent to pharmacy. Pt scheduled to meet with Diabetes Educator 02/20. Pt verbalized understanding and denied further questions.

## 2022-04-20 NOTE — Telephone Encounter (Signed)
-----   Message from Woodroe Mode, MD sent at 04/20/2022 10:25 AM EST ----- Please arrange diabetes education due to elevated fasting glucose

## 2022-04-26 ENCOUNTER — Other Ambulatory Visit: Payer: Self-pay | Admitting: *Deleted

## 2022-04-26 ENCOUNTER — Encounter: Payer: Self-pay | Admitting: *Deleted

## 2022-04-26 ENCOUNTER — Ambulatory Visit (INDEPENDENT_AMBULATORY_CARE_PROVIDER_SITE_OTHER): Payer: Medicaid Other | Admitting: Obstetrics and Gynecology

## 2022-04-26 ENCOUNTER — Ambulatory Visit: Payer: Medicaid Other | Admitting: *Deleted

## 2022-04-26 ENCOUNTER — Encounter: Payer: Self-pay | Admitting: Obstetrics and Gynecology

## 2022-04-26 ENCOUNTER — Ambulatory Visit: Payer: Medicaid Other | Attending: Maternal & Fetal Medicine

## 2022-04-26 ENCOUNTER — Other Ambulatory Visit: Payer: Self-pay

## 2022-04-26 VITALS — BP 123/69 | HR 108

## 2022-04-26 VITALS — BP 109/64 | HR 114 | Wt 330.6 lb

## 2022-04-26 DIAGNOSIS — Z3A30 30 weeks gestation of pregnancy: Secondary | ICD-10-CM

## 2022-04-26 DIAGNOSIS — Z8679 Personal history of other diseases of the circulatory system: Secondary | ICD-10-CM

## 2022-04-26 DIAGNOSIS — O24410 Gestational diabetes mellitus in pregnancy, diet controlled: Secondary | ICD-10-CM | POA: Diagnosis not present

## 2022-04-26 DIAGNOSIS — R638 Other symptoms and signs concerning food and fluid intake: Secondary | ICD-10-CM | POA: Insufficient documentation

## 2022-04-26 DIAGNOSIS — O30043 Twin pregnancy, dichorionic/diamniotic, third trimester: Secondary | ICD-10-CM

## 2022-04-26 DIAGNOSIS — O99210 Obesity complicating pregnancy, unspecified trimester: Secondary | ICD-10-CM

## 2022-04-26 DIAGNOSIS — O30002 Twin pregnancy, unspecified number of placenta and unspecified number of amniotic sacs, second trimester: Secondary | ICD-10-CM | POA: Diagnosis present

## 2022-04-26 DIAGNOSIS — Z6841 Body Mass Index (BMI) 40.0 and over, adult: Secondary | ICD-10-CM

## 2022-04-26 DIAGNOSIS — O99213 Obesity complicating pregnancy, third trimester: Secondary | ICD-10-CM

## 2022-04-26 DIAGNOSIS — O09893 Supervision of other high risk pregnancies, third trimester: Secondary | ICD-10-CM

## 2022-04-26 DIAGNOSIS — Z3A35 35 weeks gestation of pregnancy: Secondary | ICD-10-CM

## 2022-04-26 DIAGNOSIS — O09293 Supervision of pregnancy with other poor reproductive or obstetric history, third trimester: Secondary | ICD-10-CM

## 2022-04-26 DIAGNOSIS — Z362 Encounter for other antenatal screening follow-up: Secondary | ICD-10-CM | POA: Diagnosis present

## 2022-04-26 DIAGNOSIS — O99212 Obesity complicating pregnancy, second trimester: Secondary | ICD-10-CM | POA: Diagnosis present

## 2022-04-26 DIAGNOSIS — O09213 Supervision of pregnancy with history of pre-term labor, third trimester: Secondary | ICD-10-CM | POA: Diagnosis not present

## 2022-04-26 DIAGNOSIS — O0990 Supervision of high risk pregnancy, unspecified, unspecified trimester: Secondary | ICD-10-CM

## 2022-04-26 DIAGNOSIS — Z8632 Personal history of gestational diabetes: Secondary | ICD-10-CM

## 2022-04-26 DIAGNOSIS — O0993 Supervision of high risk pregnancy, unspecified, third trimester: Secondary | ICD-10-CM

## 2022-04-26 DIAGNOSIS — Q513 Bicornate uterus: Secondary | ICD-10-CM

## 2022-04-27 NOTE — Progress Notes (Addendum)
   PRENATAL VISIT NOTE  Subjective:  Tonya Walls is a 32 y.o. G3P1103 at [redacted]w[redacted]d being seen today for ongoing prenatal care.  She is currently monitored for the following issues for this high-risk pregnancy and has Bicornate uterus; Obesity in pregnancy; BMI 50.0-59.9, adult ([place]); Hypertension; Supervision of high risk pregnancy, antepartum; Dichorionic diamniotic twin pregnancy in third trimester; and Short interval between pregnancies affecting pregnancy in third trimester, antepartum on their problem list.  Patient reports no complaints.  Contractions: Irritability. Vag. Bleeding: None.  Movement: Present. Denies leaking of fluid.   The following portions of the patient's history were reviewed and updated as appropriate: allergies, current medications, past family history, past medical history, past social history, past surgical history and problem list.   Objective:   Vitals:   04/26/22 1623  BP: 109/64  Pulse: (!) 114  Weight: (!) 330 lb 9.6 oz (150 kg)    Fetal Status:     Movement: Present     General:  Alert, oriented and cooperative. Patient is in no acute distress.  Skin: Skin is warm and dry. No rash noted.   Cardiovascular: Normal heart rate noted  Respiratory: Normal respiratory effort, no problems with respiration noted  Abdomen: Soft, gravid, appropriate for gestational age.  Pain/Pressure: Absent     Pelvic: Cervical exam deferred        Extremities: Normal range of motion.  Edema: Moderate pitting, indentation subsides rapidly  Mental Status: Normal mood and affect. Normal behavior. Normal judgment and thought content.   Assessment and Plan:  Pregnancy: G3P1103 at [redacted]w[redacted]d 1. Short interval between pregnancies affecting pregnancy in third trimester, antepartum 04/2021 twin SVD  2. Dichorionic diamniotic twin pregnancy in third trimester 2/12: 55%, 1725g, ac 78%, breech, afi wnl; 86%, 1932g, ac 99%, afi wnl, breech D/w her BW:GYKZ for c/s if A continues to be  breech  3. BMI 50.0-59.9, adult (HCC) 32lbs total weight gain  4. Bicornate uterus  5. History of hypertension ?CHTN diagnosed last pregnancy.  Continue to follow closely. Continue low dose asa  6. Obesity in pregnancy  7. Supervision of high risk pregnancy, antepartum D/w her re: BC options. BTL papers signed today. D/w her that likely would only be able to do if she has a c/s due to BMI  8. GDM Has education appt on 2/20 but pt had GDMA1 last pregnancy. I told her I recommend she start checking am fasting and 2 hour post prandial now   Preterm labor symptoms and general obstetric precautions including but not limited to vaginal bleeding, contractions, leaking of fluid and fetal movement were reviewed in detail with the patient. Please refer to After Visit Summary for other counseling recommendations.   No follow-ups on file.  Future Appointments  Date Time Provider [place]  05/04/2022  4:15 PM [facility]  05/10/2022 11:15 AM [facility] NURSE [facility]  05/10/2022 11:30 AM [facility] US2 [facility]  05/17/2022  4:15 PM Griffin Basil, MD [facility]  05/18/2022 10:15 AM [facility] NURSE [facility]  05/18/2022 10:30 AM [facility] US2 [facility]  05/24/2022 12:30 PM [facility] NURSE [facility]  05/24/2022 12:45 PM [facility] US5 [facility]  05/31/2022 11:15 AM [facility] NURSE [facility]  05/31/2022 11:30 AM [facility] US2 [facility]  06/07/2022 11:15 AM [facility] NURSE [facility]  06/07/2022 11:30 AM [facility] US3 [facility]    Aletha Halim, MD

## 2022-04-29 ENCOUNTER — Encounter: Payer: Self-pay | Admitting: *Deleted

## 2022-05-04 ENCOUNTER — Other Ambulatory Visit: Payer: Medicaid Other

## 2022-05-10 ENCOUNTER — Ambulatory Visit: Payer: Medicaid Other | Attending: Maternal & Fetal Medicine

## 2022-05-10 ENCOUNTER — Ambulatory Visit: Payer: Medicaid Other

## 2022-05-13 ENCOUNTER — Inpatient Hospital Stay (HOSPITAL_COMMUNITY)
Admission: AD | Admit: 2022-05-13 | Discharge: 2022-05-13 | Disposition: A | Discharge status: Home / Self Care | Payer: Medicaid Other | Attending: Obstetrics and Gynecology | Admitting: Obstetrics and Gynecology

## 2022-05-13 ENCOUNTER — Encounter (HOSPITAL_COMMUNITY): Payer: Self-pay | Admitting: Obstetrics and Gynecology

## 2022-05-13 ENCOUNTER — Encounter: Payer: Self-pay | Admitting: Obstetrics & Gynecology

## 2022-05-13 ENCOUNTER — Encounter: Payer: Self-pay | Admitting: Physical Therapy

## 2022-05-13 DIAGNOSIS — O09293 Supervision of pregnancy with other poor reproductive or obstetric history, third trimester: Secondary | ICD-10-CM | POA: Insufficient documentation

## 2022-05-13 DIAGNOSIS — Z0371 Encounter for suspected problem with amniotic cavity and membrane ruled out: Secondary | ICD-10-CM

## 2022-05-13 DIAGNOSIS — Z3A33 33 weeks gestation of pregnancy: Secondary | ICD-10-CM

## 2022-05-13 DIAGNOSIS — O26893 Other specified pregnancy related conditions, third trimester: Secondary | ICD-10-CM | POA: Diagnosis present

## 2022-05-13 DIAGNOSIS — O30043 Twin pregnancy, dichorionic/diamniotic, third trimester: Secondary | ICD-10-CM

## 2022-05-13 DIAGNOSIS — O0990 Supervision of high risk pregnancy, unspecified, unspecified trimester: Secondary | ICD-10-CM

## 2022-05-13 LAB — WET PREP, GENITAL
Clue Cells Wet Prep HPF POC: NONE SEEN
Sperm: NONE SEEN
Trich, Wet Prep: NONE SEEN
WBC, Wet Prep HPF POC: >=10 — AB (ref <10)
Yeast Wet Prep HPF POC: NONE SEEN

## 2022-05-13 LAB — URINALYSIS, ROUTINE W REFLEX MICROSCOPIC
Glucose, UA: NEGATIVE mg/dL
Hgb urine dipstick: NEGATIVE
Ketones, ur: NEGATIVE mg/dL
Leukocytes,Ua: NEGATIVE
Nitrite: NEGATIVE
Protein, ur: NEGATIVE mg/dL
Specific Gravity, Urine: 1.025 (ref 1.005–1.030)
pH: 6.5 (ref 5.0–8.0)

## 2022-05-13 NOTE — MAU Note (Signed)
Tonya Walls is a 32 y.o. at 34w1dhere in MAU reporting: lost her mucous plug, since then has been having a lot of discharge. Slight bleeding. Feeling a lot of pressure, lower back pain earlier, but not so much now Onset of complaint: earlier today Pain score: 2 Vitals:   05/13/22 1636  BP: (!) 148/84  Pulse: 100  Resp: 20  Temp: 98.9 F (37.2 C)  SpO2: 100%      Lab orders placed from triage:  urine

## 2022-05-13 NOTE — MAU Provider Note (Addendum)
History     CSN: [id number]  Arrival date and time: 05/13/22 1620   Event Date/Time   First Provider Initiated Contact with Patient 05/13/22 1746      Chief Complaint  Patient presents with   Back Pain   Vaginal Discharge   Vaginal Bleeding   HPI [name] is a 32 y.o. year old G64P1103 female at 18w1dweeks gestation who presents to MAU reporting she lost her mucous plug and has had a lot of vaginal discharge since then. She reports slight bleeding, a lot of pressure, lower back pain; but not as much now. She rates her pain 2/10. She reports (+) FM x 2.  [facility] next appt is 05/17/2022. Her spouse is present and contributing to the history taking.     OB History     Gravida  3   Para  2   Term  1   Preterm  1   AB  0   Living  3      SAB  0   IAB  0   Ectopic  0   Multiple  1   Live Births  3           Past Medical History:  Diagnosis Date   Avulsion fracture of tibial tuberosity    acute, left knee   Bicornuate uterus    Gestational diabetes    History of twin pregnancy in prior pregnancy 12/27/2020   Obesity, Class III, BMI 40-49.9 (morbid obesity) ([place]    Preeclampsia    Pregnancy induced hypertension    Wears glasses     Past Surgical History:  Procedure Laterality Date   OPEN REDUCTION INTERNAL FIXATION (ORIF) TIBIAL TUBERCLE Left 03/01/2018   Procedure: OPEN REDUCTION INTERNAL FIXATION (ORIF)LEFT  TIBIAL TUBERCLE;  Surgeon: VHiram Gash MD;  [facility]  Service: Orthopedics;  Laterality: Left;   WISDOM TOOTH EXTRACTION      Family History  Problem Relation Age of Onset   Healthy Mother    Diabetes Father     Social History   Tobacco Use   Smoking status: Former    Types: Cigarettes    Quit date: 11/13/2012    Years since quitting: 9.5   Smokeless tobacco: Never  Vaping Use   Vaping Use: Never used  Substance Use Topics   Alcohol use: No   Drug use: No    Allergies: No Known  Allergies  Medications Prior to Admission  Medication Sig Dispense Refill Last Dose   aspirin EC 81 MG tablet Take 1 tablet (81 mg total) by mouth daily. Take after 12 weeks for prevention of preeclampsia later in pregnancy (Patient taking differently: Take 81 mg by mouth daily.) 300 tablet 2 05/13/2022   famotidine (PEPCID) 20 MG tablet Take 1 tablet (20 mg total) by mouth daily. 90 tablet 3 05/12/2022   Prenatal Vit-Fe Fumarate-FA (PRENATAL PO) Take 1 tablet by mouth daily.   05/13/2022   pantoprazole (PROTONIX) 40 MG tablet Take 1 tablet (40 mg total) by mouth daily. (Patient not taking: Reported on 04/08/2022) 30 tablet 3 Not Taking    Review of Systems  Constitutional: Negative.   HENT: Negative.    Eyes: Negative.   Respiratory: Negative.    Cardiovascular: Negative.   Gastrointestinal: Negative.   Endocrine: Negative.   Genitourinary:  Positive for vaginal discharge (mucous plug and vaginal discharge).  Musculoskeletal:  Positive for back pain.  Skin: Negative.   Allergic/Immunologic: Negative.  Neurological: Negative.   Hematological: Negative.   Psychiatric/Behavioral: Negative.     Physical Exam   Blood pressure 136/68, pulse (!) 116, temperature 98.9 F (37.2 C), temperature source Oral, resp. rate 20, height '5\' 3"'$  (1.6 m), weight (!) 154.9 kg, last menstrual period 09/23/2021, SpO2 100 %, not currently breastfeeding.  Physical Exam Vitals and nursing note reviewed. Exam conducted with a chaperone present.  Constitutional:      Appearance: Normal appearance. She is obese.  Cardiovascular:     Rate and Rhythm: Tachycardia present.  Pulmonary:     Effort: Pulmonary effort is normal.  Abdominal:     Palpations: Abdomen is soft.  Genitourinary:    General: Normal vulva.     Comments: Pelvic exam: External genitalia normal, SE: vaginal walls pink and well rugated, cervix is smooth, pink, no lesions, moderate amt of thick, mucoid vaginal d/c -- WP, GC/CT done, cervix  visually closed, Uterus is non-tender, no CMT or friability, no adnexal tenderness.  Musculoskeletal:        General: Normal range of motion.  Skin:    General: Skin is warm and dry.  Neurological:     Mental Status: She is alert and oriented to person, place, and time.  Psychiatric:        Mood and Affect: Mood normal.        Behavior: Behavior normal.        Thought Content: Thought content normal.        Judgment: Judgment normal.    REACTIVE NST - FHR (A): 140 bpm / moderate variability / accels present / decels absent / FHR (B): 150 bpm / moderate variability / accels present / decels absent / TOCO: irregular UC with UI noted  MAU Course  Procedures  MDM Wet Prep GC/CT -- Results pending   Results for orders placed or performed during the hospital encounter of 05/13/22 (from the past 24 hour(s))  Wet prep, genital     Status: Abnormal   Collection Time: 05/13/22  6:17 PM   Specimen: PATH Cytology Cervicovaginal Ancillary Only  Result Value Ref Range   Yeast Wet Prep HPF POC NONE SEEN NONE SEEN   Trich, Wet Prep NONE SEEN NONE SEEN   Clue Cells Wet Prep HPF POC NONE SEEN NONE SEEN   WBC, Wet Prep HPF POC >=10 (A) <10   Sperm NONE SEEN     Assessment and Plan  1. No leakage of amniotic fluid into vagina - Reassurance given that the mucous she is passing from her vaginal could be her mucous plug, but no indication of labor - Discussed labor precautions  2. [redacted] weeks gestation of pregnancy   - Discharge patient - Keep scheduled appt with [facility] on Monday 05/17/2022 - Patient verbalized an understanding of the plan of care and agrees.   Laury Deep, CNM 05/13/2022, 7:29 PM

## 2022-05-14 LAB — GC/CHLAMYDIA PROBE AMP (~~LOC~~) NOT AT ARMC
Chlamydia: NEGATIVE
Comment: NEGATIVE
Comment: NORMAL
Neisseria Gonorrhea: NEGATIVE

## 2022-05-16 ENCOUNTER — Other Ambulatory Visit: Payer: Self-pay

## 2022-05-16 ENCOUNTER — Encounter (HOSPITAL_COMMUNITY): Payer: Self-pay | Admitting: Obstetrics and Gynecology

## 2022-05-16 ENCOUNTER — Inpatient Hospital Stay (HOSPITAL_COMMUNITY)
Admission: AD | Admit: 2022-05-16 | Discharge: 2022-05-18 | DRG: 784 | Disposition: A | Discharge status: Home / Self Care | Payer: Medicaid Other | Attending: Obstetrics and Gynecology | Admitting: Obstetrics and Gynecology

## 2022-05-16 ENCOUNTER — Inpatient Hospital Stay (HOSPITAL_COMMUNITY): Payer: Medicaid Other | Admitting: Anesthesiology

## 2022-05-16 ENCOUNTER — Encounter (HOSPITAL_COMMUNITY)
Admission: AD | Disposition: A | Discharge status: Home / Self Care | Payer: Self-pay | Source: Home / Self Care | Attending: Obstetrics and Gynecology

## 2022-05-16 DIAGNOSIS — O09893 Supervision of other high risk pregnancies, third trimester: Secondary | ICD-10-CM

## 2022-05-16 DIAGNOSIS — Q513 Bicornate uterus: Secondary | ICD-10-CM

## 2022-05-16 DIAGNOSIS — O30043 Twin pregnancy, dichorionic/diamniotic, third trimester: Secondary | ICD-10-CM | POA: Diagnosis present

## 2022-05-16 DIAGNOSIS — O42913 Preterm premature rupture of membranes, unspecified as to length of time between rupture and onset of labor, third trimester: Principal | ICD-10-CM | POA: Diagnosis present

## 2022-05-16 DIAGNOSIS — O3403 Maternal care for unspecified congenital malformation of uterus, third trimester: Secondary | ICD-10-CM | POA: Diagnosis present

## 2022-05-16 DIAGNOSIS — O134 Gestational [pregnancy-induced] hypertension without significant proteinuria, complicating childbirth: Secondary | ICD-10-CM

## 2022-05-16 DIAGNOSIS — I10 Essential (primary) hypertension: Secondary | ICD-10-CM | POA: Diagnosis present

## 2022-05-16 DIAGNOSIS — O99214 Obesity complicating childbirth: Secondary | ICD-10-CM | POA: Diagnosis present

## 2022-05-16 DIAGNOSIS — O164 Unspecified maternal hypertension, complicating childbirth: Secondary | ICD-10-CM | POA: Diagnosis present

## 2022-05-16 DIAGNOSIS — Z7982 Long term (current) use of aspirin: Secondary | ICD-10-CM | POA: Diagnosis not present

## 2022-05-16 DIAGNOSIS — Z302 Encounter for sterilization: Secondary | ICD-10-CM

## 2022-05-16 DIAGNOSIS — O329XX0 Maternal care for malpresentation of fetus, unspecified, not applicable or unspecified: Secondary | ICD-10-CM | POA: Diagnosis not present

## 2022-05-16 DIAGNOSIS — O24420 Gestational diabetes mellitus in childbirth, diet controlled: Secondary | ICD-10-CM

## 2022-05-16 DIAGNOSIS — Z3A34 34 weeks gestation of pregnancy: Secondary | ICD-10-CM | POA: Diagnosis not present

## 2022-05-16 DIAGNOSIS — O0990 Supervision of high risk pregnancy, unspecified, unspecified trimester: Secondary | ICD-10-CM

## 2022-05-16 DIAGNOSIS — Z3A33 33 weeks gestation of pregnancy: Secondary | ICD-10-CM

## 2022-05-16 DIAGNOSIS — O321XX1 Maternal care for breech presentation, fetus 1: Secondary | ICD-10-CM

## 2022-05-16 DIAGNOSIS — O9902 Anemia complicating childbirth: Secondary | ICD-10-CM | POA: Diagnosis present

## 2022-05-16 DIAGNOSIS — Z87891 Personal history of nicotine dependence: Secondary | ICD-10-CM | POA: Diagnosis not present

## 2022-05-16 DIAGNOSIS — O42013 Preterm premature rupture of membranes, onset of labor within 24 hours of rupture, third trimester: Secondary | ICD-10-CM

## 2022-05-16 DIAGNOSIS — Z6841 Body Mass Index (BMI) 40.0 and over, adult: Secondary | ICD-10-CM

## 2022-05-16 DIAGNOSIS — O99210 Obesity complicating pregnancy, unspecified trimester: Secondary | ICD-10-CM | POA: Diagnosis present

## 2022-05-16 DIAGNOSIS — O26893 Other specified pregnancy related conditions, third trimester: Secondary | ICD-10-CM | POA: Diagnosis present

## 2022-05-16 DIAGNOSIS — O4290 Premature rupture of membranes, unspecified as to length of time between rupture and onset of labor, unspecified weeks of gestation: Principal | ICD-10-CM | POA: Diagnosis present

## 2022-05-16 LAB — CBC WITH DIFFERENTIAL/PLATELET
Abs Immature Granulocytes: 0.02 10*3/uL (ref 0.00–0.07)
Basophils Absolute: 0 10*3/uL (ref 0.0–0.1)
Basophils Relative: 0 %
Eosinophils Absolute: 0.1 10*3/uL (ref 0.0–0.5)
Eosinophils Relative: 1 %
HCT: 28.2 % — ABNORMAL LOW (ref 36.0–46.0)
Hemoglobin: 8.1 g/dL — ABNORMAL LOW (ref 12.0–15.0)
Immature Granulocytes: 0 %
Lymphocytes Relative: 23 %
Lymphs Abs: 1.4 10*3/uL (ref 0.7–4.0)
MCH: 22.2 pg — ABNORMAL LOW (ref 26.0–34.0)
MCHC: 28.7 g/dL — ABNORMAL LOW (ref 30.0–36.0)
MCV: 77.3 fL — ABNORMAL LOW (ref 80.0–100.0)
Monocytes Absolute: 0.7 10*3/uL (ref 0.1–1.0)
Monocytes Relative: 10 %
Neutro Abs: 4.1 10*3/uL (ref 1.7–7.7)
Neutrophils Relative %: 66 %
Platelets: 236 10*3/uL (ref 150–400)
RBC: 3.65 MIL/uL — ABNORMAL LOW (ref 3.87–5.11)
RDW: 17 % — ABNORMAL HIGH (ref 11.5–15.5)
WBC: 6.3 10*3/uL (ref 4.0–10.5)
nRBC: 0.5 % — ABNORMAL HIGH (ref 0.0–0.2)

## 2022-05-16 LAB — COMPREHENSIVE METABOLIC PANEL
ALT: 14 U/L (ref 0–44)
AST: 38 U/L (ref 15–41)
Albumin: 2.3 g/dL — ABNORMAL LOW (ref 3.5–5.0)
Alkaline Phosphatase: 129 U/L — ABNORMAL HIGH (ref 38–126)
Anion gap: 10 (ref 5–15)
BUN: 6 mg/dL (ref 6–20)
CO2: 17 mmol/L — ABNORMAL LOW (ref 22–32)
Calcium: 8.5 mg/dL — ABNORMAL LOW (ref 8.9–10.3)
Chloride: 109 mmol/L (ref 98–111)
Creatinine, Ser: 0.57 mg/dL (ref 0.44–1.00)
GFR, Estimated: >60 mL/min (ref >60)
Glucose, Bld: 121 mg/dL — ABNORMAL HIGH (ref 70–99)
Potassium: 3.7 mmol/L (ref 3.5–5.1)
Sodium: 136 mmol/L (ref 135–145)
Total Bilirubin: 0.9 mg/dL (ref 0.3–1.2)
Total Protein: 6.5 g/dL (ref 6.5–8.1)

## 2022-05-16 LAB — CBC
HCT: 27.3 % — ABNORMAL LOW (ref 36.0–46.0)
Hemoglobin: 7.8 g/dL — ABNORMAL LOW (ref 12.0–15.0)
MCH: 22.8 pg — ABNORMAL LOW (ref 26.0–34.0)
MCHC: 28.6 g/dL — ABNORMAL LOW (ref 30.0–36.0)
MCV: 79.8 fL — ABNORMAL LOW (ref 80.0–100.0)
Platelets: 212 10*3/uL (ref 150–400)
RBC: 3.42 MIL/uL — ABNORMAL LOW (ref 3.87–5.11)
RDW: 17.1 % — ABNORMAL HIGH (ref 11.5–15.5)
WBC: 7 10*3/uL (ref 4.0–10.5)
nRBC: 0.4 % — ABNORMAL HIGH (ref 0.0–0.2)

## 2022-05-16 LAB — URINALYSIS, ROUTINE W REFLEX MICROSCOPIC
Glucose, UA: NEGATIVE mg/dL
Hgb urine dipstick: NEGATIVE
Ketones, ur: 20 mg/dL — AB
Leukocytes,Ua: NEGATIVE
Nitrite: NEGATIVE
Protein, ur: 100 mg/dL — AB
Specific Gravity, Urine: 1.027 (ref 1.005–1.030)
pH: 5 (ref 5.0–8.0)

## 2022-05-16 LAB — AMNISURE RUPTURE OF MEMBRANE (ROM) NOT AT ARMC: Amnisure ROM: POSITIVE

## 2022-05-16 LAB — GLUCOSE, CAPILLARY
Glucose-Capillary: 119 mg/dL — ABNORMAL HIGH (ref 70–99)
Glucose-Capillary: 104 mg/dL — ABNORMAL HIGH (ref 70–99)

## 2022-05-16 LAB — PREPARE RBC (CROSSMATCH)

## 2022-05-16 SURGERY — Surgical Case
Anesthesia: Epidural

## 2022-05-16 MED ORDER — ACETAMINOPHEN 10 MG/ML IV SOLN: 1000.0000 mg | Freq: Once | INTRAVENOUS | Status: DC | PRN

## 2022-05-16 MED ORDER — OXYCODONE HCL 5 MG PO TABS: 5.0000 mg | ORAL_TABLET | Freq: Once | ORAL | Status: DC | PRN

## 2022-05-16 MED ORDER — ONDANSETRON HCL 4 MG/2ML IJ SOLN
4.0000 mg | Freq: Four times a day (QID) | INTRAMUSCULAR | Status: DC | PRN
  Administered 2022-05-16: 4 mg via INTRAVENOUS
  Filled 2022-05-16: qty 2

## 2022-05-16 MED ORDER — EPHEDRINE 5 MG/ML INJ: 10.0000 mg | INTRAVENOUS | Status: DC | PRN

## 2022-05-16 MED ORDER — OXYTOCIN-SODIUM CHLORIDE 30-0.9 UT/500ML-% IV SOLN
INTRAVENOUS | Status: AC
  Filled 2022-05-16: qty 500

## 2022-05-16 MED ORDER — MORPHINE SULFATE (PF) 0.5 MG/ML IJ SOLN
INTRAMUSCULAR | Status: AC
  Filled 2022-05-16: qty 10

## 2022-05-16 MED ORDER — COCONUT OIL OIL: 1.0000 | TOPICAL_OIL | Status: DC | PRN

## 2022-05-16 MED ORDER — FENTANYL CITRATE (PF) 100 MCG/2ML IJ SOLN: 50.0000 ug | INTRAMUSCULAR | Status: DC | PRN

## 2022-05-16 MED ORDER — OXYTOCIN BOLUS FROM INFUSION: 333.0000 mL | Freq: Once | INTRAVENOUS | Status: DC

## 2022-05-16 MED ORDER — PRENATAL MULTIVITAMIN CH
1.0000 | ORAL_TABLET | Freq: Every day | ORAL | Status: DC
  Administered 2022-05-17 – 2022-05-18 (×2): 1 via ORAL
  Filled 2022-05-16 (×2): qty 1

## 2022-05-16 MED ORDER — NIFEDIPINE ER OSMOTIC RELEASE 30 MG PO TB24
30.0000 mg | ORAL_TABLET | Freq: Every day | ORAL | Status: DC
  Administered 2022-05-16: 30 mg via ORAL
  Filled 2022-05-16: qty 1

## 2022-05-16 MED ORDER — AZITHROMYCIN 250 MG PO TABS
1000.0000 mg | ORAL_TABLET | Freq: Once | ORAL | Status: AC
  Administered 2022-05-16: 1000 mg via ORAL
  Filled 2022-05-16: qty 4

## 2022-05-16 MED ORDER — LACTATED RINGERS IV SOLN: INTRAVENOUS | Status: DC | PRN

## 2022-05-16 MED ORDER — DEXTROSE 5 % IV SOLN
INTRAVENOUS | Status: DC | PRN
  Administered 2022-05-16: 3 g via INTRAVENOUS

## 2022-05-16 MED ORDER — ACETAMINOPHEN 325 MG PO TABS: 650.0000 mg | ORAL_TABLET | ORAL | Status: DC | PRN

## 2022-05-16 MED ORDER — WITCH HAZEL-GLYCERIN EX PADS: 1.0000 | MEDICATED_PAD | CUTANEOUS | Status: DC | PRN

## 2022-05-16 MED ORDER — BETAMETHASONE SOD PHOS & ACET 6 (3-3) MG/ML IJ SUSP
12.0000 mg | INTRAMUSCULAR | Status: DC
  Administered 2022-05-16: 12 mg via INTRAMUSCULAR
  Filled 2022-05-16: qty 5

## 2022-05-16 MED ORDER — PROMETHAZINE HCL 25 MG/ML IJ SOLN: 6.2500 mg | INTRAMUSCULAR | Status: DC | PRN

## 2022-05-16 MED ORDER — ONDANSETRON 4 MG PO TBDP
4.0000 mg | ORAL_TABLET | Freq: Three times a day (TID) | ORAL | Status: DC | PRN
  Administered 2022-05-16 – 2022-05-17 (×2): 4 mg via ORAL
  Filled 2022-05-16 (×2): qty 1

## 2022-05-16 MED ORDER — LIDOCAINE-EPINEPHRINE (PF) 2 %-1:200000 IJ SOLN
INTRAMUSCULAR | Status: DC | PRN
  Administered 2022-05-16 (×3): 5 mL via EPIDURAL

## 2022-05-16 MED ORDER — LIDOCAINE HCL (PF) 1 % IJ SOLN: 30.0000 mL | INTRAMUSCULAR | Status: DC | PRN

## 2022-05-16 MED ORDER — MENTHOL 3 MG MT LOZG: 1.0000 | LOZENGE | OROMUCOSAL | Status: DC | PRN

## 2022-05-16 MED ORDER — PHENYLEPHRINE 80 MCG/ML (10ML) SYRINGE FOR IV PUSH (FOR BLOOD PRESSURE SUPPORT)
80.0000 ug | PREFILLED_SYRINGE | INTRAVENOUS | Status: DC | PRN
  Filled 2022-05-16: qty 10

## 2022-05-16 MED ORDER — OXYCODONE HCL 5 MG/5ML PO SOLN: 5.0000 mg | Freq: Once | ORAL | Status: DC | PRN

## 2022-05-16 MED ORDER — DIPHENHYDRAMINE HCL 50 MG/ML IJ SOLN: 12.5000 mg | INTRAMUSCULAR | Status: DC | PRN

## 2022-05-16 MED ORDER — SENNOSIDES-DOCUSATE SODIUM 8.6-50 MG PO TABS
2.0000 | ORAL_TABLET | Freq: Every day | ORAL | Status: DC
  Administered 2022-05-17 – 2022-05-18 (×2): 2 via ORAL
  Filled 2022-05-16 (×2): qty 2

## 2022-05-16 MED ORDER — TRANEXAMIC ACID-NACL 1000-0.7 MG/100ML-% IV SOLN
INTRAVENOUS | Status: AC
  Filled 2022-05-16: qty 100

## 2022-05-16 MED ORDER — LACTATED RINGERS IV BOLUS
1000.0000 mL | Freq: Once | INTRAVENOUS | Status: AC
  Administered 2022-05-16: 1000 mL via INTRAVENOUS

## 2022-05-16 MED ORDER — KETOROLAC TROMETHAMINE 30 MG/ML IJ SOLN
30.0000 mg | Freq: Four times a day (QID) | INTRAMUSCULAR | Status: AC
  Administered 2022-05-16 – 2022-05-17 (×4): 30 mg via INTRAVENOUS
  Filled 2022-05-16 (×4): qty 1

## 2022-05-16 MED ORDER — LACTATED RINGERS IV SOLN: 500.0000 mL | Freq: Once | INTRAVENOUS | Status: DC

## 2022-05-16 MED ORDER — MORPHINE SULFATE (PF) 0.5 MG/ML IJ SOLN
INTRAMUSCULAR | Status: DC | PRN
  Administered 2022-05-16: 3 mg via EPIDURAL

## 2022-05-16 MED ORDER — HYDROMORPHONE HCL 1 MG/ML IJ SOLN
INTRAMUSCULAR | Status: AC
  Filled 2022-05-16: qty 0.5

## 2022-05-16 MED ORDER — DIPHENHYDRAMINE HCL 25 MG PO CAPS: 25.0000 mg | ORAL_CAPSULE | Freq: Four times a day (QID) | ORAL | Status: DC | PRN

## 2022-05-16 MED ORDER — OXYTOCIN-SODIUM CHLORIDE 30-0.9 UT/500ML-% IV SOLN
INTRAVENOUS | Status: DC | PRN
  Administered 2022-05-16: 30 [IU] via INTRAVENOUS

## 2022-05-16 MED ORDER — SODIUM CHLORIDE 0.9 % IV SOLN
2.0000 g | Freq: Four times a day (QID) | INTRAVENOUS | Status: DC
  Administered 2022-05-16: 2 g via INTRAVENOUS
  Filled 2022-05-16: qty 2000

## 2022-05-16 MED ORDER — OXYTOCIN-SODIUM CHLORIDE 30-0.9 UT/500ML-% IV SOLN
2.5000 [IU]/h | INTRAVENOUS | Status: DC
  Filled 2022-05-16: qty 500

## 2022-05-16 MED ORDER — SIMETHICONE 80 MG PO CHEW: 80.0000 mg | CHEWABLE_TABLET | ORAL | Status: DC | PRN

## 2022-05-16 MED ORDER — AMOXICILLIN 500 MG PO CAPS: 500.0000 mg | ORAL_CAPSULE | Freq: Three times a day (TID) | ORAL | Status: DC

## 2022-05-16 MED ORDER — DEXMEDETOMIDINE HCL IN NACL 80 MCG/20ML IV SOLN
INTRAVENOUS | Status: DC | PRN
  Administered 2022-05-16: 8 ug via BUCCAL
  Administered 2022-05-16: 12 ug via BUCCAL

## 2022-05-16 MED ORDER — PHENYLEPHRINE 80 MCG/ML (10ML) SYRINGE FOR IV PUSH (FOR BLOOD PRESSURE SUPPORT)
80.0000 ug | PREFILLED_SYRINGE | INTRAVENOUS | Status: DC | PRN
  Administered 2022-05-16: 80 ug via INTRAVENOUS

## 2022-05-16 MED ORDER — DEXAMETHASONE SODIUM PHOSPHATE 10 MG/ML IJ SOLN
INTRAMUSCULAR | Status: AC
  Filled 2022-05-16: qty 1

## 2022-05-16 MED ORDER — FENTANYL CITRATE (PF) 100 MCG/2ML IJ SOLN
INTRAMUSCULAR | Status: AC
  Filled 2022-05-16: qty 2

## 2022-05-16 MED ORDER — SOD CITRATE-CITRIC ACID 500-334 MG/5ML PO SOLN
30.0000 mL | ORAL | Status: DC | PRN
  Filled 2022-05-16: qty 30

## 2022-05-16 MED ORDER — PHENYLEPHRINE 80 MCG/ML (10ML) SYRINGE FOR IV PUSH (FOR BLOOD PRESSURE SUPPORT)
PREFILLED_SYRINGE | INTRAVENOUS | Status: DC | PRN
  Administered 2022-05-16: 80 ug via INTRAVENOUS
  Administered 2022-05-16: 160 ug via INTRAVENOUS
  Administered 2022-05-16 (×2): 80 ug via INTRAVENOUS

## 2022-05-16 MED ORDER — OXYCODONE-ACETAMINOPHEN 5-325 MG PO TABS: 1.0000 | ORAL_TABLET | ORAL | Status: DC | PRN

## 2022-05-16 MED ORDER — OXYCODONE-ACETAMINOPHEN 5-325 MG PO TABS: 2.0000 | ORAL_TABLET | ORAL | Status: DC | PRN

## 2022-05-16 MED ORDER — EPHEDRINE 5 MG/ML INJ
10.0000 mg | INTRAVENOUS | Status: DC | PRN
  Filled 2022-05-16: qty 5

## 2022-05-16 MED ORDER — SODIUM CHLORIDE 0.9% IV SOLUTION: Freq: Once | INTRAVENOUS | Status: DC

## 2022-05-16 MED ORDER — ACETAMINOPHEN 500 MG PO TABS
1000.0000 mg | ORAL_TABLET | Freq: Four times a day (QID) | ORAL | Status: DC
  Administered 2022-05-17 – 2022-05-18 (×6): 1000 mg via ORAL
  Filled 2022-05-16 (×7): qty 2

## 2022-05-16 MED ORDER — SIMETHICONE 80 MG PO CHEW
80.0000 mg | CHEWABLE_TABLET | Freq: Three times a day (TID) | ORAL | Status: DC
  Administered 2022-05-17 – 2022-05-18 (×4): 80 mg via ORAL
  Filled 2022-05-16 (×4): qty 1

## 2022-05-16 MED ORDER — SODIUM CHLORIDE 0.9 % IV SOLN: INTRAVENOUS | Status: DC | PRN

## 2022-05-16 MED ORDER — SODIUM CHLORIDE 0.9 % IR SOLN
Status: DC | PRN
  Administered 2022-05-16: 1

## 2022-05-16 MED ORDER — OXYTOCIN-SODIUM CHLORIDE 30-0.9 UT/500ML-% IV SOLN
2.5000 [IU]/h | INTRAVENOUS | Status: AC
  Administered 2022-05-16: 2.5 [IU]/h via INTRAVENOUS

## 2022-05-16 MED ORDER — CEFAZOLIN IN SODIUM CHLORIDE 3-0.9 GM/100ML-% IV SOLN
INTRAVENOUS | Status: AC
  Filled 2022-05-16: qty 100

## 2022-05-16 MED ORDER — ALBUMIN HUMAN 5 % IV SOLN: INTRAVENOUS | Status: DC | PRN

## 2022-05-16 MED ORDER — FENTANYL CITRATE (PF) 100 MCG/2ML IJ SOLN
INTRAMUSCULAR | Status: DC | PRN
  Administered 2022-05-16: 50 ug via INTRAVENOUS

## 2022-05-16 MED ORDER — ENOXAPARIN SODIUM 80 MG/0.8ML IJ SOSY
80.0000 mg | PREFILLED_SYRINGE | INTRAMUSCULAR | Status: DC
  Administered 2022-05-17 – 2022-05-18 (×2): 80 mg via SUBCUTANEOUS
  Filled 2022-05-16 (×2): qty 0.8

## 2022-05-16 MED ORDER — HYDROMORPHONE HCL 1 MG/ML IJ SOLN
0.5000 mg | INTRAMUSCULAR | Status: AC | PRN
  Administered 2022-05-16 (×2): 0.5 mg via INTRAVENOUS

## 2022-05-16 MED ORDER — FENTANYL CITRATE (PF) 100 MCG/2ML IJ SOLN: 25.0000 ug | INTRAMUSCULAR | Status: DC | PRN

## 2022-05-16 MED ORDER — IBUPROFEN 600 MG PO TABS
600.0000 mg | ORAL_TABLET | Freq: Four times a day (QID) | ORAL | Status: DC
  Administered 2022-05-17 – 2022-05-18 (×2): 600 mg via ORAL
  Filled 2022-05-16 (×2): qty 1

## 2022-05-16 MED ORDER — ZOLPIDEM TARTRATE 5 MG PO TABS: 5.0000 mg | ORAL_TABLET | Freq: Every evening | ORAL | Status: DC | PRN

## 2022-05-16 MED ORDER — DIBUCAINE (PERIANAL) 1 % EX OINT: 1.0000 | TOPICAL_OINTMENT | CUTANEOUS | Status: DC | PRN

## 2022-05-16 MED ORDER — OXYCODONE HCL 5 MG PO TABS
5.0000 mg | ORAL_TABLET | ORAL | Status: DC | PRN
  Administered 2022-05-18: 10 mg via ORAL
  Administered 2022-05-18: 5 mg via ORAL
  Filled 2022-05-16: qty 2
  Filled 2022-05-16: qty 1

## 2022-05-16 MED ORDER — ONDANSETRON HCL 4 MG/2ML IJ SOLN
INTRAMUSCULAR | Status: DC | PRN
  Administered 2022-05-16: 4 mg via INTRAVENOUS

## 2022-05-16 MED ORDER — LACTATED RINGERS IV SOLN: 500.0000 mL | INTRAVENOUS | Status: DC | PRN

## 2022-05-16 MED ORDER — LACTATED RINGERS IV SOLN: INTRAVENOUS | Status: DC

## 2022-05-16 MED ORDER — DEXMEDETOMIDINE HCL IN NACL 80 MCG/20ML IV SOLN
INTRAVENOUS | Status: AC
  Filled 2022-05-16: qty 20

## 2022-05-16 MED ORDER — FENTANYL CITRATE (PF) 100 MCG/2ML IJ SOLN
INTRAMUSCULAR | Status: DC | PRN
  Administered 2022-05-16: 100 ug via EPIDURAL

## 2022-05-16 MED ORDER — FENTANYL-BUPIVACAINE-NACL 0.5-0.125-0.9 MG/250ML-% EP SOLN
12.0000 mL/h | EPIDURAL | Status: DC | PRN
  Administered 2022-05-16: 12 mL/h via EPIDURAL
  Filled 2022-05-16 (×2): qty 250

## 2022-05-16 MED ORDER — NIFEDIPINE 10 MG PO CAPS
10.0000 mg | ORAL_CAPSULE | ORAL | Status: AC
  Administered 2022-05-16 (×3): 10 mg via ORAL
  Filled 2022-05-16 (×3): qty 1

## 2022-05-16 MED ORDER — LIDOCAINE-EPINEPHRINE (PF) 2 %-1:200000 IJ SOLN
INTRAMUSCULAR | Status: AC
  Filled 2022-05-16: qty 20

## 2022-05-16 MED ORDER — LIDOCAINE HCL (PF) 1 % IJ SOLN
INTRAMUSCULAR | Status: DC | PRN
  Administered 2022-05-16: 3 mL via EPIDURAL
  Administered 2022-05-16: 5 mL via EPIDURAL

## 2022-05-16 MED ORDER — STERILE WATER FOR IRRIGATION IR SOLN
Status: DC | PRN
  Administered 2022-05-16: 1

## 2022-05-16 MED ORDER — ONDANSETRON HCL 4 MG/2ML IJ SOLN
INTRAMUSCULAR | Status: AC
  Filled 2022-05-16: qty 2

## 2022-05-16 MED ORDER — HYDROMORPHONE HCL 1 MG/ML IJ SOLN
INTRAMUSCULAR | Status: AC
  Filled 2022-05-16: qty 1

## 2022-05-16 MED ORDER — TRANEXAMIC ACID-NACL 1000-0.7 MG/100ML-% IV SOLN
INTRAVENOUS | Status: DC | PRN
  Administered 2022-05-16: 1000 mg via INTRAVENOUS

## 2022-05-16 MED ORDER — ALBUMIN HUMAN 5 % IV SOLN
INTRAVENOUS | Status: AC
  Filled 2022-05-16: qty 250

## 2022-05-16 SURGICAL SUPPLY — 35 items
APL PRP STRL LF DISP 70% ISPRP (MISCELLANEOUS) ×2
APL SKNCLS STERI-STRIP NONHPOA (GAUZE/BANDAGES/DRESSINGS) ×1
BENZOIN TINCTURE PRP APPL 2/3 (GAUZE/BANDAGES/DRESSINGS) IMPLANT
CHLORAPREP W/TINT 26 (MISCELLANEOUS) ×2 IMPLANT
CLAMP UMBILICAL CORD (MISCELLANEOUS) ×1 IMPLANT
CLOTH BEACON ORANGE TIMEOUT ST (SAFETY) ×1 IMPLANT
DRSG OPSITE POSTOP 4X10 (GAUZE/BANDAGES/DRESSINGS) ×1 IMPLANT
ELECT REM PT RETURN 9FT ADLT (ELECTROSURGICAL) ×1
ELECTRODE REM PT RTRN 9FT ADLT (ELECTROSURGICAL) ×1 IMPLANT
EXTRACTOR VACUUM KIWI (MISCELLANEOUS) IMPLANT
GLOVE BIO SURGEON STRL SZ 6 (GLOVE) ×1 IMPLANT
GLOVE BIOGEL PI IND STRL 7.0 (GLOVE) ×2 IMPLANT
GOWN STRL REUS W/TWL LRG LVL3 (GOWN DISPOSABLE) ×2 IMPLANT
KIT ABG SYR 3ML LUER SLIP (SYRINGE) IMPLANT
LIGASURE IMPACT 36 18CM CVD LR (INSTRUMENTS) IMPLANT
NDL HYPO 25X5/8 SAFETYGLIDE (NEEDLE) IMPLANT
NEEDLE HYPO 22GX1.5 SAFETY (NEEDLE) IMPLANT
NEEDLE HYPO 25X5/8 SAFETYGLIDE (NEEDLE) IMPLANT
NS IRRIG 1000ML POUR BTL (IV SOLUTION) ×1 IMPLANT
PACK C SECTION WH (CUSTOM PROCEDURE TRAY) ×1 IMPLANT
PAD OB MATERNITY 4.3X12.25 (PERSONAL CARE ITEMS) ×1 IMPLANT
RETRACTOR WND ALEXIS 25 LRG (MISCELLANEOUS) IMPLANT
RTRCTR WOUND ALEXIS 25CM LRG (MISCELLANEOUS)
STRIP CLOSURE SKIN 1/2X4 (GAUZE/BANDAGES/DRESSINGS) IMPLANT
SUT MON AB 4-0 PS1 27 (SUTURE) ×1 IMPLANT
SUT PLAIN 0 NONE (SUTURE) ×1 IMPLANT
SUT PLAIN 2 0 XLH (SUTURE) IMPLANT
SUT VIC AB 0 CT1 36 (SUTURE) ×3 IMPLANT
SUT VIC AB 2-0 CT1 27 (SUTURE) ×1
SUT VIC AB 2-0 CT1 TAPERPNT 27 (SUTURE) ×1 IMPLANT
SUT VIC AB 4-0 KS 27 (SUTURE) IMPLANT
SYR CONTROL 10ML LL (SYRINGE) IMPLANT
TOWEL OR 17X24 6PK STRL BLUE (TOWEL DISPOSABLE) ×1 IMPLANT
TRAY FOLEY W/BAG SLVR 14FR LF (SET/KITS/TRAYS/PACK) IMPLANT
WATER STERILE IRR 1000ML POUR (IV SOLUTION) ×1 IMPLANT

## 2022-05-16 NOTE — Progress Notes (Signed)
Exam performed after Dr. Caron Presume - fetal hand is presenting part along with umbilical cord which is palpable although membranes are still intact over these parts. FHT category 1. Recommended c-section urgently given risk for cord prolapse once lower ROM occurs. Pt consents to surgery. She still wishes to have BTL. Reviewed that this is permanent and irreversible. She would like BTL.   The risks of surgery were discussed with the patient including but were not limited to: bleeding which may require transfusion or reoperation; infection which may require antibiotics; injury to bowel, bladder, ureters or other surrounding organs; injury to the fetus; need for additional procedures including hysterectomy in the event of a life-threatening hemorrhage; formation of adhesions; placental abnormalities with subsequent pregnancies; incisional problems; thromboembolic phenomenon and other postoperative/anesthesia complications.    The patient concurred with the proposed plan, giving informed written consent for the procedure. Anesthesia and OR aware. Preoperative prophylactic antibiotics and SCDs ordered on call to the OR.  To OR when ready.  Radene Gunning, MD Attending [place], [facility] for [facility], [place]

## 2022-05-16 NOTE — Discharge Summary (Signed)
Postpartum Discharge Summary  Date of Service updated***     Patient Name: Tonya Walls DOB: 16-Sep-1990 MRN: [id number]  Date of admission: 05/16/2022 Delivery date:   Tamikah, Laneve [id number]  05/16/2022    Norean, Mcclung [id number]  05/16/2022  Delivering provider:    Maleeka, Chirico [id number]  Janariah, Mara [id number]  Damita Dunnings, [place]  Date of discharge: 05/16/2022  Admitting diagnosis: PROM (premature rupture of membranes) [O42.90] Intrauterine pregnancy: [redacted]w[redacted]d    Secondary diagnosis:  Principal Problem:   PROM (premature rupture of membranes) Active Problems:   Bicornate uterus   Obesity in pregnancy   BMI 50.0-59.9, adult ([place]   Hypertension   Supervision of high risk pregnancy, antepartum   Dichorionic diamniotic twin pregnancy in third trimester   Short interval between pregnancies affecting pregnancy in third trimester, antepartum   Presentation of cord   Cesarean delivery delivered  Additional problems: ***    Discharge diagnosis: Preterm Pregnancy Delivered and PFair[place]                                             Post partum procedures:blood transfusion and postpartum tubal ligation Augmentation: N/A Complications: [id number] Hospital course: Onset of Labor With Unplanned C/S   32y.o. yo [id number] 332w4das admitted in [facility] 05/16/2022. Patient had a labor course significant for arrival at 5 cm.  She was brought to the floor and scanned for presentation.  Tendons were vertex vertex.  Cervical exam after epidural should 8 cm dilation but hand and cord were palpated.  She was taken to the OR for close presentation.. The patient went for cesarean section due to Malpresentation and Multifetal Gestation. Delivery details as follows: Membrane Rupture Time/Date:    TuClester, [id number]    TuMelizza, [id number] ,   TuLaurann, [id number]    TuZafira, [id number]    Delivery Method:   TuDarl, [id number]-Section, Low Transverse    TuArraya, [id number]-Section, Low Transverse  Details of operation can be found in separate operative note. Patient had a postpartum course complicated by***.  She is ambulating,tolerating a regular diet, passing flatus, and urinating well.  Patient is discharged home in stable condition 05/16/22.  Newborn Data: Birth date:   TuMakhyla, [id number]/05/2022    TuGenesis, [id number]/05/2022  Birth time:   TuRane, [id number]:41 PM    TuRamon, [id number]:45 PM  Gender:   TuChenda, [id number]    TuAlayna, [id number]  Living status:   TuShonterria, [id number]    TuLeonila, [id number]  Apgars:   TuTremya, [id number] [id number] ,   TuLeeah, [id number] [address] [[id number]  Weight:   TuKeanna, [id number] g    TuRheagan, [id number] g   Magnesium Sulfate received: {Mag received:30440022} BMZ received: {BMZ received:30440023} Rhophylac:{Rhophylac received:30440032} MMR:{MMR:30440033} T-DaP:{Tdap:23962} Flu: {FWG:1132360ransfusion:{Transfusion received:30440034}  Physical exam  Vitals:   05/16/22 1710 05/16/22  1711 05/16/22 1716 05/16/22 1845  BP:  130/70 123/71 107/60  Pulse:  (!) 101 (!) 110 (!) 103  Resp:      Temp:    97.6 F (36.4 C)  TempSrc:    Oral  SpO2: 99%   96%  Weight:      Height:       General: {Exam; general:21111117} Lochia: {Desc; appropriate/inappropriate:30686::"appropriate"} Uterine Fundus: {Desc; firm/soft:30687} Incision: {Exam; incision:21111123} DVT Evaluation: {Exam; YS:[id number] Labs: Lab Results  Component Value Date   WBC 6.3 05/16/2022   HGB 8.1 (L) 05/16/2022   HCT 28.2 (L) 05/16/2022   MCV 77.3 (L)  05/16/2022   PLT 236 05/16/2022      Latest Ref Rng & Units 05/16/2022    1:24 PM  CMP  Glucose 70 - 99 mg/dL 121   BUN 6 - 20 mg/dL 6   Creatinine 0.44 - 1.00 mg/dL 0.57   Sodium 135 - 145 mmol/L 136   Potassium 3.5 - 5.1 mmol/L 3.7   Chloride 98 - 111 mmol/L 109   CO2 22 - 32 mmol/L 17   Calcium 8.9 - 10.3 mg/dL 8.5   Total Protein 6.5 - 8.1 g/dL 6.5   Total Bilirubin 0.3 - 1.2 mg/dL 0.9   Alkaline Phos 38 - 126 U/L 129   AST 15 - 41 U/L 38   ALT 0 - 44 U/L 14    Edinburgh Score:    04/25/2021   12:36 PM  Edinburgh Postnatal Depression Scale Screening Tool  I have been able to laugh and see the funny side of things. 0  I have looked forward with enjoyment to things. 0  I have blamed myself unnecessarily when things went wrong. 0  I have been anxious or worried for no good reason. 3  I have felt scared or panicky for no good reason. 0  Things have been getting on top of me. 0  I have been so unhappy that I have had difficulty sleeping. 0  I have felt sad or miserable. 0  I have been so unhappy that I have been crying. 0  The thought of harming myself has occurred to me. 0  Edinburgh Postnatal Depression Scale Total 3     After visit meds:  Allergies as of 05/16/2022   No Known Allergies   Med Rec must be completed prior to using this [facility]***        Discharge home in stable condition Infant Feeding: {Baby feeding:23562} Infant Disposition:{CHL IP OB HOME WITH DX:[id number] Discharge instruction: per After Visit Summary and Postpartum booklet. Activity: Advance as tolerated. Pelvic rest for 6 weeks.  Diet: {OB BY:630183 Future Appointments: Future Appointments  Date Time Provider [place]  05/17/2022  4:15 PM Griffin Basil, MD [facility]  05/18/2022 10:15 AM [facility] NURSE [facility]  05/18/2022 10:30 AM [facility] US2 [facility]  05/24/2022 12:30 PM [facility] NURSE [facility]  05/24/2022 12:45 PM [facility] US5 [facility]  05/31/2022 11:15 AM  [facility] NURSE [facility]  05/31/2022 11:30 AM [facility] US2 [facility]  06/07/2022 11:15 AM [facility] NURSE [facility]  06/07/2022 11:30 AM [facility] US3 [facility] [place]   Follow up Visit: Message sent  Please schedule this patient for a In person postpartum visit in 6 weeks with the following provider: Any provider. Additional Postpartum F/U:Incision check 1 week and BP check 1 week  High risk pregnancy complicated by: HTN and multifetal gestation  Delivery mode:     Demetriana, Cacciatore [id number]  C-Section,  Low Transverse    Loralee, Cacchione [id number]  C-Section, Low Transverse  Anticipated Birth Control:  BTL done [facility]   05/16/2022 Concepcion Living, MD

## 2022-05-16 NOTE — MAU Note (Signed)
Tonya Walls is a 32 y.o. at 73w4dhere in MAU reporting: contractions ongoing since previous MAU visit. Unsure about frequency but states they are often. Still seeing some mucus discharge but no ROM. +FM x2.  Onset of complaint: ongoing  Pain score: 5/10  Vitals:   05/16/22 1226  BP: 128/65  Pulse: (!) 136  Resp: 16  Temp: 99.9 F (37.7 C)  SpO2: 97%     FSN:[id number] 143, Baby B 164  Lab orders placed from triage: UA

## 2022-05-16 NOTE — Anesthesia Procedure Notes (Signed)
Epidural Patient location during procedure: OB Start time: 05/16/2022 4:17 PM End time: 05/16/2022 4:25 PM  Staffing Anesthesiologist: Nilda Simmer, MD Performed: anesthesiologist   Preanesthetic Checklist Completed: patient identified, IV checked, site marked, risks and benefits discussed, surgical consent, monitors and equipment checked, pre-op evaluation and timeout performed  Epidural Patient position: sitting Prep: DuraPrep and site prepped and draped Patient monitoring: continuous pulse ox and blood pressure Approach: midline Location: L3-L4 Injection technique: LOR saline  Needle:  Needle type: Tuohy  Needle gauge: 17 G Needle length: 9 cm and 9 Needle insertion depth: 8 cm Catheter type: closed end flexible Catheter size: 19 Gauge Catheter at skin depth: 13 cm Test dose: negative  Assessment Events: blood not aspirated, no cerebrospinal fluid, injection not painful, no injection resistance, no paresthesia and negative IV test  Additional Notes The patient has requested an epidural for labor pain management. Risks and benefits including, but not limited to, infection, bleeding, local anesthetic toxicity, headache, hypotension, back pain, block failure, etc. were discussed with the patient. The patient expressed understanding and consented to the procedure. I confirmed that the patient has no bleeding disorders and is not taking blood thinners. I confirmed the patient's last platelet count with the nurse. A time-out was performed immediately prior to the procedure. Please see nursing documentation for vital signs. Sterile technique was used throughout the whole procedure. Once LOR achieved, the epidural catheter threaded easily without resistance. Aspiration of the catheter was negative for blood and CSF. The epidural was dosed slowly and an infusion was started.  -1-- attempt(s)Reason for block:procedure for pain

## 2022-05-16 NOTE — H&P (Addendum)
OBSTETRIC ADMISSION HISTORY AND PHYSICAL  Tonya Walls is a 31 y.o. female [phone] with IUP at 63w4dby LMP presenting for SOL. She reports +FMs, No LOF, no VB, no blurry vision, headaches or peripheral edema, and RUQ pain.  She plans on bottle feeding. She requests BTL (consent signed 2/12) vs Nexplanon for birth control. She received her [facility] care at  [facility] --->  Estimated Date of Delivery: 06/30/22  Sono:    '@[redacted]w[redacted]d'$ , CWD, normal anatomy x2, Fetus A breech L w ant placenta EFW 1725g 55%, fetus B breech R with ant placenta EFW 1932g 86%   Prenatal History/Complications: Di/Di twin gestation, A1GDM, bicornate uterus, hx of HTN, short interval pregnancy (twins 2/23)  Past Medical History: Past Medical History:  Diagnosis Date   Avulsion fracture of tibial tuberosity    acute, left knee   Bicornuate uterus    Gestational diabetes    History of twin pregnancy in prior pregnancy 12/27/2020   Obesity, Class III, BMI 40-49.9 (morbid obesity) ([facility]    Preeclampsia    Pregnancy induced hypertension    Wears glasses     Past Surgical History: Past Surgical History:  Procedure Laterality Date   OPEN REDUCTION INTERNAL FIXATION (ORIF) TIBIAL TUBERCLE Left 03/01/2018   Procedure: OPEN REDUCTION INTERNAL FIXATION (ORIF)LEFT  TIBIAL TUBERCLE;  Surgeon: VHiram Gash MD;  [facility]  Service: Orthopedics;  Laterality: Left;   WISDOM TOOTH EXTRACTION      Obstetrical History: OB History     Gravida  3   Para  2   Term  1   Preterm  1   AB  0   Living  3      SAB  0   IAB  0   Ectopic  0   Multiple  1   Live Births  3           Social History Social History   Socioeconomic History   Marital status: Single    Spouse name: Not on file   Number of children: Not on file   Years of education: Not on file   Highest education level: Not on file  Occupational History   Not on file  Tobacco Use   Smoking status: Former    Types:  Cigarettes    Quit date: 11/13/2012    Years since quitting: 9.5   Smokeless tobacco: Never  Vaping Use   Vaping Use: Never used  Substance and Sexual Activity   Alcohol use: No   Drug use: No   Sexual activity: Yes    Birth control/protection: None  Other Topics Concern   Not on file  Social History Narrative   Not on file   Social Determinants of Health   Financial Resource Strain: Not on file  Food Insecurity: No Food Insecurity (02/01/2022)   Hunger Vital Sign    Worried About Running Out of Food in the Last Year: Never true    Ran Out of Food in the Last Year: Never true  Transportation Needs: No Transportation Needs (02/01/2022)   PRAPARE - THydrologist(Medical): No    Lack of Transportation (Non-Medical): No  Physical Activity: Not on file  Stress: Not on file  Social Connections: Not on file    Family History: Family History  Problem Relation Age of Onset   Healthy Mother    Diabetes Father     Allergies: No Known Allergies  Medications Prior to Admission  Medication Sig Dispense Refill Last Dose   aspirin EC 81 MG tablet Take 1 tablet (81 mg total) by mouth daily. Take after 12 weeks for prevention of preeclampsia later in pregnancy (Patient taking differently: Take 81 mg by mouth daily.) 300 tablet 2 05/15/2022   famotidine (PEPCID) 20 MG tablet Take 1 tablet (20 mg total) by mouth daily. 90 tablet 3 Past Week   ibuprofen (ADVIL) 800 MG tablet Take 800 mg by mouth every 8 (eight) hours as needed for moderate pain.   05/15/2022 at 0800   Prenatal Vit-Fe Fumarate-FA (PRENATAL PO) Take 1 tablet by mouth daily.   05/15/2022   pantoprazole (PROTONIX) 40 MG tablet Take 1 tablet (40 mg total) by mouth daily. (Patient not taking: Reported on 04/08/2022) 30 tablet 3      Review of Systems   All systems reviewed and negative except as stated in HPI  Blood pressure 123/62, pulse (!) 112, temperature 99.9 F (37.7 C), temperature source Oral,  resp. rate 16, height '5\' 3"'$  (1.6 m), weight (!) 153.1 kg, last menstrual period 09/23/2021, SpO2 97 %, not currently breastfeeding. General appearance: alert, cooperative, and mild distress Lungs: clear to auscultation bilaterally Heart: regular rate and rhythm Abdomen: soft, non-tender; bowel sounds normal Pelvic: performed by other provider Extremities: no edema, no sign of DVT DTR's Presentation: vertex, vertex Fetal monitoring A - Baseline: 150 bpm, Variability: Good {> 6 bpm), Accelerations: Reactive, and Decelerations: Variable: mild B- 145/moderate/accelerations present/variable decels presnt Uterine activityFrequency: Every 1.5-3 minutes Dilation: 5 Effacement (%): 50 Station: -3 Exam by:: Mercado-Ortiz DO   Prenatal labs: ABO, Rh: O/Positive/-- (11/08 [id number]) Antibody: Negative (11/08 0856) Rubella: 4.74 (11/08 0856) RPR: Non Reactive (01/25 0854)  HBsAg: Negative (11/08 0856)  HIV: Non Reactive (01/25 0854)  GBS:   unknown  1 hr Glucola abnormal Genetic screening  NIPS: LR M x 2, AFP: negative, Horizon: negative x 4 (11/12/2020) Anatomy US normal x2  Prenatal Transfer Tool  Maternal Diabetes: Yes:  Diabetes Type:  Diet controlled Genetic Screening: Normal Maternal Ultrasounds/Referrals: Normal Fetal Ultrasounds or other Referrals:  None Maternal Substance Abuse:  No Significant Maternal Medications:  Meds include: Protonix Significant Maternal Lab Results:  None Number of Prenatal Visits:greater than 3 verified prenatal visits Other Comments:  None  Results for orders placed or performed during the hospital encounter of 05/16/22 (from the past 24 hour(s))  Urinalysis, Routine w reflex microscopic -Urine, Clean Catch   Collection Time: 05/16/22  1:24 PM  Result Value Ref Range   Color, Urine AMBER (A) YELLOW   APPearance CLOUDY (A) CLEAR   Specific Gravity, Urine 1.027 1.005 - 1.030   pH 5.0 5.0 - 8.0   Glucose, UA NEGATIVE NEGATIVE mg/dL   Hgb urine dipstick  NEGATIVE NEGATIVE   Bilirubin Urine SMALL (A) NEGATIVE   Ketones, ur 20 (A) NEGATIVE mg/dL   Protein, ur 100 (A) NEGATIVE mg/dL   Nitrite NEGATIVE NEGATIVE   Leukocytes,Ua NEGATIVE NEGATIVE   RBC / HPF 0-5 0 - 5 RBC/hpf   WBC, UA 21-50 0 - 5 WBC/hpf   Bacteria, UA FEW (A) NONE SEEN   Squamous Epithelial / HPF 11-20 0 - 5 /HPF   Mucus PRESENT   Amnisure rupture of membrane (rom)not at [facility]   Collection Time: 05/16/22  1:24 PM  Result Value Ref Range   Amnisure ROM POSITIVE   CBC with Differential/Platelet   Collection Time: 05/16/22  1:24 PM  Result Value Ref Range  WBC 6.3 4.0 - 10.5 K/uL   RBC 3.65 (L) 3.87 - 5.11 MIL/uL   Hemoglobin 8.1 (L) 12.0 - 15.0 g/dL   HCT 28.2 (L) 36.0 - 46.0 %   MCV 77.3 (L) 80.0 - 100.0 fL   MCH 22.2 (L) 26.0 - 34.0 pg   MCHC 28.7 (L) 30.0 - 36.0 g/dL   RDW 17.0 (H) 11.5 - 15.5 %   Platelets 236 150 - 400 K/uL   nRBC 0.5 (H) 0.0 - 0.2 %   Neutrophils Relative % 66 %   Neutro Abs 4.1 1.7 - 7.7 K/uL   Lymphocytes Relative 23 %   Lymphs Abs 1.4 0.7 - 4.0 K/uL   Monocytes Relative 10 %   Monocytes Absolute 0.7 0.1 - 1.0 K/uL   Eosinophils Relative 1 %   Eosinophils Absolute 0.1 0.0 - 0.5 K/uL   Basophils Relative 0 %   Basophils Absolute 0.0 0.0 - 0.1 K/uL   Immature Granulocytes 0 %   Abs Immature Granulocytes 0.02 0.00 - 0.07 K/uL  Comprehensive metabolic panel   Collection Time: 05/16/22  1:24 PM  Result Value Ref Range   Sodium 136 135 - 145 mmol/L   Potassium 3.7 3.5 - 5.1 mmol/L   Chloride 109 98 - 111 mmol/L   CO2 17 (L) 22 - 32 mmol/L   Glucose, Bld 121 (H) 70 - 99 mg/dL   BUN 6 6 - 20 mg/dL   Creatinine, Ser 0.57 0.44 - 1.00 mg/dL   Calcium 8.5 (L) 8.9 - 10.3 mg/dL   Total Protein 6.5 6.5 - 8.1 g/dL   Albumin 2.3 (L) 3.5 - 5.0 g/dL   AST 38 15 - 41 U/L   ALT 14 0 - 44 U/L   Alkaline Phosphatase 129 (H) 38 - 126 U/L   Total Bilirubin 0.9 0.3 - 1.2 mg/dL   GFR, Estimated >60 >60 mL/min   Anion gap 10 5 - 15    Patient  Active Problem List   Diagnosis Date Noted   Short interval between pregnancies affecting pregnancy in third trimester, antepartum 04/26/2022   Dichorionic diamniotic twin pregnancy in third trimester 02/01/2022   Supervision of high risk pregnancy, antepartum 01/20/2022   Hypertension 07/27/2021   BMI 50.0-59.9, adult ([place]) 12/27/2020   Obesity in pregnancy 11/27/2015   Bicornate uterus 04/15/2015    Assessment/Plan:  Tonya Walls is a 32 y.o. G3P1103 at 54w4dhere for SOL.  #Labor: Onset of contractions 2/29, worsening with cervical dilation on exam. Unclear time of ROM but Amnisure positive. S/p IL IVF in MAU. Received one dose BMZ, 3 doses Procardia 10 mg q. 20 minutes x 3 doses for tocolysis.  #Pain: Epidural #FWB: Cat II #ID:  GBS unknown. Ruptured. Latency abx: ampicillin 2g q6h x48h, then amoxicillin '500mg'$  TID. S/p azithromycin 1g x1. #MOF: Bottle #MOC: BTL (consent signed 2/12) vs Nexplanon. Discussed in office that patient would likely only be candidate for postpartum BTL if C-section due to habitus. Pt would like Nexplanon in-hospital with planned interval BTL. #Circ:  Yes  LDarlen Round MD PGY-1 05/16/2022, 3:18 PM   Fellow Attestation  I saw and evaluated the patient, performing the key elements of the service.I  personally performed or re-performed the history, physical exam, and medical decision making activities of this service and have verified that the service and findings are accurately documented in the resident's note. I developed the management plan that is described in the resident's note, and I agree with the content, with my  edits above.    Gifford Shave, MD OB Fellow

## 2022-05-16 NOTE — MAU Provider Note (Signed)
MAU Provider Note  History  [id number]  Arrival date and time: 05/16/22 1210   Chief Complaint  Patient presents with   Contractions     HPI Tonya Walls is a 32 y.o. G3P1103 at 74w4dby LMP with PMHx notable for Di/Di twin gestation, A1GDM, who presents for contractions x 2/29.  She notes her contractions are getting worse and she has vomited throughout the night.  She sounds another twin vaginal delivery before approximately a year ago.  Her last growth scan was on 2/12 when she was 30 weeks and 5 days.  EFW for baby A was 55% and EFW for BPP was 86%.  11% discordant.  Her last blood sugar was in the 120s.  She notes she consistently stays in the 120s to 130s.   Vaginal bleeding: No LOF: No Fetal Movement: Yes Contractions: Yes  O/Positive/-- (11/08 0856)  OB History     Gravida  3   Para  2   Term  1   Preterm  1   AB  0   Living  3      SAB  0   IAB  0   Ectopic  0   Multiple  1   Live Births  3           Past Medical History:  Diagnosis Date   Avulsion fracture of tibial tuberosity    acute, left knee   Bicornuate uterus    Gestational diabetes    History of twin pregnancy in prior pregnancy 12/27/2020   Obesity, Class III, BMI 40-49.9 (morbid obesity) ([facility]    Preeclampsia    Pregnancy induced hypertension    Wears glasses     Past Surgical History:  Procedure Laterality Date   OPEN REDUCTION INTERNAL FIXATION (ORIF) TIBIAL TUBERCLE Left 03/01/2018   Procedure: OPEN REDUCTION INTERNAL FIXATION (ORIF)LEFT  TIBIAL TUBERCLE;  Surgeon: VHiram Gash MD;  [facility]  Service: Orthopedics;  Laterality: Left;   WISDOM TOOTH EXTRACTION      Family History  Problem Relation Age of Onset   Healthy Mother    Diabetes Father     Social History   Socioeconomic History   Marital status: Single    Spouse name: Not on file   Number of children: Not on file   Years of education: Not on file   Highest education level: Not on file   Occupational History   Not on file  Tobacco Use   Smoking status: Former    Types: Cigarettes    Quit date: 11/13/2012    Years since quitting: 9.5   Smokeless tobacco: Never  Vaping Use   Vaping Use: Never used  Substance and Sexual Activity   Alcohol use: No   Drug use: No   Sexual activity: Yes    Birth control/protection: None  Other Topics Concern   Not on file  Social History Narrative   Not on file   Social Determinants of Health   Financial Resource Strain: Not on file  Food Insecurity: No Food Insecurity (02/01/2022)   Hunger Vital Sign    Worried About Running Out of Food in the Last Year: Never true    Ran Out of Food in the Last Year: Never true  Transportation Needs: No Transportation Needs (02/01/2022)   PRAPARE - THydrologist(Medical): No    Lack of Transportation (Non-Medical): No  Physical Activity: Not on file  Stress: Not on file  Social  Connections: Not on file  Intimate Partner Violence: Not on file    No Known Allergies  No current facility-administered medications on file prior to encounter.   Current Outpatient Medications on File Prior to Encounter  Medication Sig Dispense Refill   aspirin EC 81 MG tablet Take 1 tablet (81 mg total) by mouth daily. Take after 12 weeks for prevention of preeclampsia later in pregnancy (Patient taking differently: Take 81 mg by mouth daily.) 300 tablet 2   famotidine (PEPCID) 20 MG tablet Take 1 tablet (20 mg total) by mouth daily. 90 tablet 3   ibuprofen (ADVIL) 800 MG tablet Take 800 mg by mouth every 8 (eight) hours as needed for moderate pain.     Prenatal Vit-Fe Fumarate-FA (PRENATAL PO) Take 1 tablet by mouth daily.     pantoprazole (PROTONIX) 40 MG tablet Take 1 tablet (40 mg total) by mouth daily. (Patient not taking: Reported on 04/08/2022) 30 tablet 3     Review of Systems  Constitutional:  Negative for appetite change, chills and fever.  HENT:  Negative for congestion,  facial swelling and postnasal drip.   Eyes:  Negative for photophobia.  Respiratory:  Negative for cough and shortness of breath.   Cardiovascular:  Negative for chest pain.  Gastrointestinal:  Positive for abdominal pain. Negative for nausea and vomiting.  Endocrine: Negative for polyuria.  Genitourinary:  Negative for flank pain and pelvic pain.  Musculoskeletal:  Negative for arthralgias.  Skin:  Negative for rash.  Neurological:  Negative for weakness and light-headedness.  Psychiatric/Behavioral:  Negative for confusion.     Pertinent positives and negative per HPI, all others reviewed and negative  Physical Exam   BP 123/62   Pulse (!) 112   Temp 99.9 F (37.7 C) (Oral)   Resp 16   Ht '5\' 3"'$  (1.6 m)   Wt (!) 153.1 kg   LMP 09/23/2021   SpO2 97% Comment: room air  BMI 59.80 kg/m   Patient Vitals for the past 24 hrs:  BP Temp Temp src Pulse Resp SpO2 Height Weight  05/16/22 1432 123/62 -- -- (!) 112 -- -- -- --  05/16/22 1431 123/62 -- -- -- -- -- -- --  05/16/22 1414 (!) 107/59 -- -- (!) 125 -- -- -- --  05/16/22 1412 (!) 107/59 -- -- -- -- -- -- --  05/16/22 1351 132/70 -- -- -- -- -- -- --  05/16/22 1226 128/65 99.9 F (37.7 C) Oral (!) 136 16 97 % -- --  05/16/22 1218 -- -- -- -- -- -- '5\' 3"'$  (1.6 m) (!) 153.1 kg    Physical Exam Vitals reviewed.  Constitutional:      Appearance: Normal appearance.  HENT:     Head: Normocephalic and atraumatic.     Right Ear: External ear normal.     Left Ear: External ear normal.     Nose: Nose normal.     Mouth/Throat:     Pharynx: Oropharynx is clear.  Eyes:     Conjunctiva/sclera: Conjunctivae normal.  Cardiovascular:     Rate and Rhythm: Normal rate and regular rhythm.  Pulmonary:     Effort: Pulmonary effort is normal.  Abdominal:     Palpations: Abdomen is soft.     Comments: Gravid  Genitourinary:    General: Normal vulva.     Vagina: No vaginal discharge.     Rectum: Normal.     Comments: SVE Dilation:  4 Effacement (%): 50, 60 Station: -3 Exam  by:: Clement Husbands DO  Update:  Dilation: 5 Effacement (%): 50 Station: -3 Presentation: Vertex (x2 per BSUS by Dr. Clement Husbands) Exam by:: Mercado-Ortiz DO   Skin:    General: Skin is warm.     Capillary Refill: Capillary refill takes less than 2 seconds.  Neurological:     Mental Status: Mental status is at baseline.  Psychiatric:        Mood and Affect: Mood normal.     Cervical Exam Dilation: 5 Effacement (%): 50 Station: -3 Presentation: Vertex (x2 per BSUS by Dr. Clement Husbands) Exam by:: Mercado-Ortiz DO  Bedside Ultrasound Pt informed that the ultrasound is considered a limited OB ultrasound and is not intended to be a complete ultrasound exam.  Patient also informed that the ultrasound is not being completed with the intent of assessing for fetal or placental anomalies or any pelvic abnormalities.  Explained that the purpose of today's ultrasound is to assess for  presentation.  Patient acknowledges the purpose of the exam and the limitations of the study.   Findings: baby A; cephalic, Baby B: cephalic  FHT A: [id number] variability/ 10x10 accels/ None decels Cat: 1 B: 150bpm/Moderate variability/ 10x10 accels/ None decels Cat: 1 Toco: q2-66mn  Labs Results for orders placed or performed during the hospital encounter of 05/16/22 (from the past 24 hour(s))  Urinalysis, Routine w reflex microscopic -Urine, Clean Catch     Status: Abnormal   Collection Time: 05/16/22  1:24 PM  Result Value Ref Range   Color, Urine AMBER (A) YELLOW   APPearance CLOUDY (A) CLEAR   Specific Gravity, Urine 1.027 1.005 - 1.030   pH 5.0 5.0 - 8.0   Glucose, UA NEGATIVE NEGATIVE mg/dL   Hgb urine dipstick NEGATIVE NEGATIVE   Bilirubin Urine SMALL (A) NEGATIVE   Ketones, ur 20 (A) NEGATIVE mg/dL   Protein, ur 100 (A) NEGATIVE mg/dL   Nitrite NEGATIVE NEGATIVE   Leukocytes,Ua NEGATIVE NEGATIVE   RBC / HPF 0-5 0 - 5 RBC/hpf   WBC, UA  21-50 0 - 5 WBC/hpf   Bacteria, UA FEW (A) NONE SEEN   Squamous Epithelial / HPF 11-20 0 - 5 /HPF   Mucus PRESENT   [facility]    Status: None   Collection Time: 05/16/22  1:24 PM  Result Value Ref Range   Amnisure ROM POSITIVE   CBC with Differential/Platelet     Status: Abnormal   Collection Time: 05/16/22  1:24 PM  Result Value Ref Range   WBC 6.3 4.0 - 10.5 K/uL   RBC 3.65 (L) 3.87 - 5.11 MIL/uL   Hemoglobin 8.1 (L) 12.0 - 15.0 g/dL   HCT 28.2 (L) 36.0 - 46.0 %   MCV 77.3 (L) 80.0 - 100.0 fL   MCH 22.2 (L) 26.0 - 34.0 pg   MCHC 28.7 (L) 30.0 - 36.0 g/dL   RDW 17.0 (H) 11.5 - 15.5 %   Platelets 236 150 - 400 K/uL   nRBC 0.5 (H) 0.0 - 0.2 %   Neutrophils Relative % 66 %   Neutro Abs 4.1 1.7 - 7.7 K/uL   Lymphocytes Relative 23 %   Lymphs Abs 1.4 0.7 - 4.0 K/uL   Monocytes Relative 10 %   Monocytes Absolute 0.7 0.1 - 1.0 K/uL   Eosinophils Relative 1 %   Eosinophils Absolute 0.1 0.0 - 0.5 K/uL   Basophils Relative 0 %   Basophils Absolute 0.0 0.0 - 0.1 K/uL   Immature Granulocytes  0 %   Abs Immature Granulocytes 0.02 0.00 - 0.07 K/uL  Comprehensive metabolic panel     Status: Abnormal   Collection Time: 05/16/22  1:24 PM  Result Value Ref Range   Sodium 136 135 - 145 mmol/L   Potassium 3.7 3.5 - 5.1 mmol/L   Chloride 109 98 - 111 mmol/L   CO2 17 (L) 22 - 32 mmol/L   Glucose, Bld 121 (H) 70 - 99 mg/dL   BUN 6 6 - 20 mg/dL   Creatinine, Ser 0.57 0.44 - 1.00 mg/dL   Calcium 8.5 (L) 8.9 - 10.3 mg/dL   Total Protein 6.5 6.5 - 8.1 g/dL   Albumin 2.3 (L) 3.5 - 5.0 g/dL   AST 38 15 - 41 U/L   ALT 14 0 - 44 U/L   Alkaline Phosphatase 129 (H) 38 - 126 U/L   Total Bilirubin 0.9 0.3 - 1.2 mg/dL   GFR, Estimated >60 >60 mL/min   Anion gap 10 5 - 15    Imaging No results found.  MAU Course  Procedures Lab Orders         Culture, beta strep (group b only)         Urinalysis, Routine w reflex microscopic -Urine, Clean Catch          Amnisure rupture of membrane (rom)not at [facility]         CBC with Differential/Platelet         Comprehensive metabolic panel         RPR    Meds ordered this encounter  Medications   betamethasone acetate-betamethasone sodium phosphate (CELESTONE) injection 12 mg   NIFEdipine (PROCARDIA) capsule 10 mg   lactated ringers bolus 1,000 mL   azithromycin (ZITHROMAX) tablet 1,000 mg   FOLLOWED BY Linked Order Group    ampicillin (OMNIPEN) 2 g in sodium chloride 0.9 % 100 mL IVPB     Order Specific Question:   Antibiotic Indication:     Answer:   PPROM    amoxicillin (AMOXIL) capsule 500 mg   NIFEdipine (PROCARDIA-XL/NIFEDICAL-XL) 24 hr tablet 30 mg   lactated ringers infusion   oxytocin (PITOCIN) IV BOLUS FROM BAG   oxytocin (PITOCIN) IV infusion 30 units in NS 500 mL - Premix   lactated ringers infusion 500-1,000 mL   acetaminophen (TYLENOL) tablet 650 mg   fentaNYL (SUBLIMAZE) injection 50-100 mcg   ondansetron (ZOFRAN) injection 4 mg   sodium citrate-citric acid (ORACIT) solution 30 mL   lidocaine (PF) (XYLOCAINE) 1 % injection 30 mL   ePHEDrine injection 10 mg   PHENYLephrine 80 mcg/ml in normal saline Adult IV Push Syringe (For Blood Pressure Support)   lactated ringers infusion 500 mL   fentaNYL 2 mcg/mL w/ bupivacaine 0.125% in NS 250 mL epidural infusion   diphenhydrAMINE (BENADRYL) injection 12.5 mg   ePHEDrine injection 10 mg   PHENYLephrine 80 mcg/ml in normal saline Adult IV Push Syringe (For Blood Pressure Support)   Imaging Orders  No imaging studies ordered today    MDM severe  Assessment and Plan  Preterm rupture of membranes, Preterm labor Kathi Der twin gestation at 35 weeks Fetal Well being As above Discussed patient with RN. NST reviewed.   Patient presenting for contractions, noted to be in labor since her SVE shows dilation.  Mucous membranes.  1 L IVB.  Started back betamethasone, Procardia 10 mg q. 20 minutes x 3 doses.  Recheck SVE showed change. Started  procardia 30 XL, abx.  Dispo: Admit to L&D   Shelda Pal, [place] Fellow, Faculty practice [place] for [place] 05/16/22  3:34 PM

## 2022-05-16 NOTE — Anesthesia Preprocedure Evaluation (Signed)
Anesthesia Evaluation  Patient identified by MRN, date of birth, ID band Patient awake    Reviewed: Allergy & Precautions, NPO status , Patient's Chart, lab work & pertinent test results  History of Anesthesia Complications Negative for: history of anesthetic complications  Airway Mallampati: III  TM Distance: >3 FB Neck ROM: Full    Dental   Pulmonary neg pulmonary ROS, former smoker   Pulmonary exam normal breath sounds clear to auscultation       Cardiovascular negative cardio ROS Normal cardiovascular exam Rhythm:Regular Rate:Normal     Neuro/Psych    GI/Hepatic Neg liver ROS,GERD  Medicated,,  Endo/Other  diabetes, Gestational  Morbid obesity  Renal/GU negative Renal ROS     Musculoskeletal   Abdominal  (+) + obese  Peds  Hematology  (+) Blood dyscrasia (Hgb 8.1), anemia   Anesthesia Other Findings [id number] with IUP at 80w4dby LMP presenting for SOL.   Prenatal History/Complications: Di/Di twin gestation, A1GDM, bicornate uterus, hx of HTN, short interval pregnancy (twins 2/23)  Reproductive/Obstetrics (+) Pregnancy Bicornuate uterus                              Anesthesia Physical Anesthesia Plan  ASA: 3  Anesthesia Plan: Epidural   Post-op Pain Management:    Induction:   PONV Risk Score and Plan:   Airway Management Planned:   Additional Equipment:   Intra-op Plan:   Post-operative Plan:   Informed Consent: I have reviewed the patients History and Physical, chart, labs and discussed the procedure including the risks, benefits and alternatives for the proposed anesthesia with the patient or authorized representative who has indicated his/her understanding and acceptance.       Plan Discussed with: Anesthesiologist  Anesthesia Plan Comments: (I have discussed risks of neuraxial anesthesia including but not limited to infection, bleeding, nerve injury, back  pain, headache, seizures, and failure of block. Patient denies bleeding disorders and is not currently anticoagulated. Labs have been reviewed. Risks and benefits discussed. All patient's questions answered.  )         Anesthesia Quick Evaluation

## 2022-05-16 NOTE — Transfer of Care (Signed)
Immediate Anesthesia Transfer of Care Note  Patient: Tonya Walls  Procedure(s) Performed: CESAREAN SECTION  Patient Location: PACU  Anesthesia Type:Epidural  Level of Consciousness: awake  Airway & Oxygen Therapy: Patient Spontanous Breathing  Post-op Assessment: Report given to RN and Post -op Vital signs reviewed and stable  Post vital signs: Reviewed and stable  Last Vitals:  Vitals Value Taken Time  BP 107/60 05/16/22 1845  Temp    Pulse 103 05/16/22 1846  Resp    SpO2 96 % 05/16/22 1846  Vitals shown include unvalidated device data.  Last Pain:  Vitals:   05/16/22 1548  TempSrc:   PainSc: 6          Complications: No notable events documented.

## 2022-05-16 NOTE — Op Note (Signed)
Operative Note   Patient: Tonya Walls  Date of Procedure: 05/16/2022  Procedure: Primary Low Transverse Cesarean   Indications: malpresentation: cord presentation on fetus A, undesired fertility, and multiple gestation: twins dichorionic/diamniotic  Pre-operative Diagnosis: malpresentation.   Post-operative Diagnosis: Same and Bilateral Tubal Sterilization via Bilateral salpingectomy  TOLAC Candidate: No  Surgeon: Surgeon(s) and Role:    * Radene Gunning, MD - Primary    * Derral Colucci, Angelyn Punt, MD - Fellow  Assistants: none  An experienced assistant was required given the standard of surgical care given the complexity of the case.  This assistant was needed for exposure, dissection, suctioning, retraction, instrument exchange, assisting with delivery with administration of fundal pressure, and for overall help during the procedure.   Anesthesia: epidural  Anesthesiologist: Nilda Simmer, MD   Antibiotics: Cefazolin and Azithromycin   Estimated Blood Loss: 1213 ml   Total IV Fluids: 1500 ml  Urine Output:  125 cc OF clear urine  Specimens: Bilateral tubes   Complications:  PPH    Indications: Tonya Walls is a 32 y.o. [id number] with an IUP 51w4dpresenting for unscheduled, urgent cesarean secondary to the indications listed above. Clinical course notable for admission to labor and delivery for preterm labor.  On arrival patient was 5 cm.  Progressed to 8 cm.  On cervical exam and appreciated as well as cord.  C-section was called.  The risks of cesarean section were discussed with the patient including but were not limited to: bleeding which may require transfusion or reoperation; infection which may require antibiotics; injury to bowel, bladder, ureters or other surrounding organs; injury to the fetus; need for additional procedures including hysterectomy in the event of a life-threatening hemorrhage; placental abnormalities wth subsequent pregnancies, incisional  problems, thromboembolic phenomenon and other postoperative/anesthesia complications.  Patient also desires permanent sterilization.  Other reversible forms of contraception were discussed with patient; she declines all other modalities. Risks of procedure discussed with patient including but not limited to: risk of regret, permanence of method, bleeding, infection, injury to surrounding organs and need for additional procedures.  Failure risk of about 1% with increased risk of ectopic gestation if pregnancy occurs was also discussed with patient.  Also discussed possibility of post-tubal pain syndrome. The patient concurred with the proposed plan, giving informed written consent for the procedures.  Patient NPO status waived given urgency of case. Anesthesia and OR aware.  Preoperative prophylactic antibiotics and SCDs ordered on call to the OR.   Findings: Viable infants, fetus A breech presentation, no nuchal cord present.  transverse back down presentation, no nuchal cord present.[address]   Tonya, Walls[[id number] 4    THser, Walls[[id number] 4  ,    Tonya, Walls[[id number] [address][id number] [address]   Tonya, Walls[[id number]    Tonya, Walls[[id number]  . Weight    Tonya, Walls[[id number] 2500 g    Tonya, Walls[[id number] . Clear amniotic fluid. Normal placenta, three vessel cords. Normal uterus, Normal bilateral fallopian tubes, Normal bilateral ovaries.  Procedure Details: A Time Out was held and the above information confirmed. The patient received intravenous antibiotics and had sequential compression devices applied to her lower extremities preoperatively. The patient was taken back to the operative suite where epidural anesthesia was administered. After induction of anesthesia, the patient was draped and prepped in the usual  sterile manner and placed in a dorsal supine position with a leftward tilt.  A low transverse skin incision was made with scalpel and carried down through the subcutaneous tissue to the fascia. Fascial incision was made and extended transversely. The fascia was separated from the underlying rectus tissue superiorly and inferiorly. The rectus muscles were separated in the midline bluntly and the peritoneum was entered bluntly. An Alexis retractor was placed to aid in visualization of the uterus. A bladder flap was not developed. A low transverse uterine incision was made. Infant A was successfully delivered from breech presentation, the umbilical cord was clamped immediately. Cord ph was not sent, and cord blood was obtained for evaluation.  Infant B was successfully delivered from transverse, back down presentation, the umbilical cord was clamped immediately. Cord ph was not sent, and cord blood was obtained for evaluation.  The placenta was removed Intact and appeared normal. The uterine incision was closed with a single layer running unlocked suture of 0-Vicryl. Overall, excellent hemostasis was noted.  Attention was then turned to the patient's fallopian tubes, and left fallopian tube was identified and followed out to the fimbriated end.  Ligasure device was used to cauterize and cut the mesosalpinx to proximal end of the fallopian tube, removing 6cm of tube. A similar process was carried out on the right side allowing for bilateral tubal sterilization. Good hemostasis was noted overall  The abdomen and pelvis were cleared of all clot and debris and the Ubaldo Glassing was removed. Hemostasis was confirmed on all surfaces.  The peritoneum was reapproximated using 2-0 vicryl . The fascia was then closed using 0 Vicryl in a running fashion. The subcutaneous layer was reapproximated with 2-0 plain gut suture. The skin was closed with a 4-0 vicryl subcuticular stitch. The patient tolerated the procedure well. Sponge, lap, instrument and needle counts were correct x 2. She was taken to the  recovery room in stable condition.  Disposition: PACU - hemodynamically stable.    Signed: Concepcion Living, MD [facility] Fellow  Center for [facility], [place]

## 2022-05-17 ENCOUNTER — Encounter (HOSPITAL_COMMUNITY): Payer: Self-pay | Admitting: Obstetrics and Gynecology

## 2022-05-17 ENCOUNTER — Encounter: Payer: Medicaid Other | Admitting: Obstetrics and Gynecology

## 2022-05-17 LAB — CBC
HCT: 26.8 % — ABNORMAL LOW (ref 36.0–46.0)
Hemoglobin: 8.1 g/dL — ABNORMAL LOW (ref 12.0–15.0)
MCH: 23.7 pg — ABNORMAL LOW (ref 26.0–34.0)
MCHC: 30.2 g/dL (ref 30.0–36.0)
MCV: 78.4 fL — ABNORMAL LOW (ref 80.0–100.0)
Platelets: 202 10*3/uL (ref 150–400)
RBC: 3.42 MIL/uL — ABNORMAL LOW (ref 3.87–5.11)
RDW: 16.6 % — ABNORMAL HIGH (ref 11.5–15.5)
WBC: 12.4 10*3/uL — ABNORMAL HIGH (ref 4.0–10.5)
nRBC: 0.2 % (ref 0.0–0.2)

## 2022-05-17 LAB — PREPARE RBC (CROSSMATCH)

## 2022-05-17 LAB — RPR: RPR Ser Ql: NONREACTIVE

## 2022-05-17 MED ORDER — LACTATED RINGERS IV BOLUS
1000.0000 mL | Freq: Once | INTRAVENOUS | Status: AC
  Administered 2022-05-17: 1000 mL via INTRAVENOUS

## 2022-05-17 MED ORDER — IRON SUCROSE 500 MG IVPB - SIMPLE MED
500.0000 mg | Freq: Once | INTRAVENOUS | Status: AC
  Administered 2022-05-17: 500 mg via INTRAVENOUS
  Filled 2022-05-17: qty 275

## 2022-05-17 MED ORDER — PANTOPRAZOLE SODIUM 40 MG PO TBEC
40.0000 mg | DELAYED_RELEASE_TABLET | Freq: Every day | ORAL | Status: DC
  Administered 2022-05-17 – 2022-05-18 (×2): 40 mg via ORAL
  Filled 2022-05-17 (×2): qty 1

## 2022-05-17 MED ORDER — ALUM & MAG HYDROXIDE-SIMETH 200-200-20 MG/5ML PO SUSP
30.0000 mL | Freq: Four times a day (QID) | ORAL | Status: DC | PRN
  Administered 2022-05-17: 30 mL via ORAL
  Filled 2022-05-17 (×2): qty 30

## 2022-05-17 NOTE — Progress Notes (Signed)
Patient screened out for psychosocial assessment since none of the following apply: °Psychosocial stressors documented in mother or baby's chart °Gestation less than 32 weeks °Code at delivery  °Infant with anomalies °Please contact the Clinical Social Worker if specific needs arise, by MOB's request, or if MOB scores greater than 9/yes to question 10 on Edinburgh Postpartum Depression Screen. ° °Treysean Petruzzi Boyd-Gilyard, MSW, LCSW °Clinical Social Work °[phone] °  °

## 2022-05-17 NOTE — Anesthesia Postprocedure Evaluation (Signed)
Anesthesia Post Note  Patient: Benigna L Portugal  Procedure(s) Performed: CESAREAN SECTION     Patient location during evaluation: PACU Anesthesia Type: Epidural Level of consciousness: awake Pain management: pain level controlled Vital Signs Assessment: post-procedure vital signs reviewed and stable Respiratory status: spontaneous breathing, nonlabored ventilation and respiratory function stable Cardiovascular status: stable Postop Assessment: no headache, no backache and epidural receding Anesthetic complications: no   No notable events documented.  Last Vitals:  Vitals:   05/17/22 0326 05/17/22 0330  BP: 133/74 123/70  Pulse: (!) 102 (!) 115  Resp:    Temp:    SpO2:  97%    Last Pain:  Vitals:   05/17/22 0606  TempSrc:   PainSc: Asleep   Pain Goal: Patients Stated Pain Goal: 3 (05/17/22 0011)                 Nilda Simmer

## 2022-05-17 NOTE — Progress Notes (Signed)
Subjective: Postpartum Day 1: Cesarean Delivery Patient reports tolerating PO.    Objective: Vital signs in last 24 hours: Temp:  [97.6 F (36.4 C)-99.9 F (37.7 C)] 98 F (36.7 C) (03/04 0746) Pulse Rate:  [85-138] 87 (03/04 0746) Resp:  [9-29] 17 (03/04 0746) BP: (90-133)/(42-80) 103/47 (03/04 0746) SpO2:  [92 %-100 %] 100 % (03/04 0746) Weight:  [153.1 kg] 153.1 kg (03/03 1218)  Physical Exam:  General: alert, cooperative, and no distress Lochia: appropriate Uterine Fundus: firm Incision: no significant drainage DVT Evaluation: No evidence of DVT seen on physical exam.  Recent Labs    05/16/22 1855 05/17/22 0634  HGB 7.8* 8.1*  HCT 27.3* 26.8*    Assessment/Plan: Status post Cesarean section. Postoperative course complicated by anemia   IV iron infusion.  Emeterio Reeve, MD 05/17/2022, 9:04 AM

## 2022-05-18 ENCOUNTER — Ambulatory Visit: Payer: Medicaid Other

## 2022-05-18 ENCOUNTER — Encounter (HOSPITAL_COMMUNITY): Payer: Self-pay | Admitting: Obstetrics and Gynecology

## 2022-05-18 LAB — CULTURE, BETA STREP (GROUP B ONLY)

## 2022-05-18 MED ORDER — OXYCODONE HCL 5 MG PO TABS: 5.0000 mg | ORAL_TABLET | ORAL | 0 refills | Status: DC | PRN

## 2022-05-18 MED ORDER — IBUPROFEN 600 MG PO TABS: 600.0000 mg | ORAL_TABLET | Freq: Four times a day (QID) | ORAL | 0 refills | Status: AC

## 2022-05-18 NOTE — Lactation Note (Signed)
This note was copied from a baby's chart. Lactation Consultation Note  Patient Name: Tonya Walls [id number] Date: 05/18/2022 Age:30 hours   LC in to visit with Mom to twin preterm babies in the NICU.  Mom states she prefers to formula feed and will not need lactation services.     Consult Status Consult Status: Complete ([place] spoke with Mom and she declined Lactation services and prefers to formula feed her babies)    Broadus John 05/18/2022, 9:56 AM

## 2022-05-18 NOTE — Progress Notes (Signed)
   05/18/22 1441  Departure Condition  Departure Condition Good  Mobility at [facility]  Patient/Caregiver Teaching Teach Back Method Used;Discharge instructions reviewed;Prescriptions reviewed;Follow-up care reviewed;Pain management discussed;Patient/caregiver verbalized understanding;Educated about hypertension in pregnancy  Departure Mode With significant other   Patient alert and oriented x4, VS and pain stable.

## 2022-05-19 LAB — TYPE AND SCREEN
ABO/RH(D): O POS
Antibody Screen: NEGATIVE
Unit division: 0
Unit division: 0
Unit division: 0

## 2022-05-19 LAB — BPAM RBC
Blood Product Expiration Date: 202403262359
Blood Product Expiration Date: 202403272359
Blood Product Expiration Date: 202403272359
ISSUE DATE / TIME: 202403031736
ISSUE DATE / TIME: 202403031736
Unit Type and Rh: 5100
Unit Type and Rh: 5100
Unit Type and Rh: 5100

## 2022-05-19 LAB — SURGICAL PATHOLOGY

## 2022-05-24 ENCOUNTER — Ambulatory Visit: Payer: Medicaid Other

## 2022-05-24 ENCOUNTER — Ambulatory Visit (INDEPENDENT_AMBULATORY_CARE_PROVIDER_SITE_OTHER): Payer: Medicaid Other

## 2022-05-24 VITALS — BP 125/76 | HR 67

## 2022-05-24 DIAGNOSIS — R600 Localized edema: Secondary | ICD-10-CM

## 2022-05-24 MED ORDER — FUROSEMIDE 20 MG PO TABS: 20.0000 mg | ORAL_TABLET | Freq: Every day | ORAL | 0 refills | Status: DC

## 2022-05-24 NOTE — Progress Notes (Signed)
Incision & BP Check Visit  Tonya Walls is here for incision & BP check following primary c-section on 05/16/22. Steri strips removed. Incision clean and dry. Right side of incision open, but well healing. Steri strips reapplied to this area. Pain is well controlled. Reviewed s/s of infection and good wound care.  BP today is 125/76, HR 67.  Bilateral lower leg pitting edema; remains for short time. Patient reports top of both feet very tender. Lasix 20 mg daily x 3 days per [facility] MD. Reviewed good hydration. Recommended compression stockings, put on first thing in AM.   Annabell Howells, RN 05/24/2022  2:17 PM

## 2022-05-31 ENCOUNTER — Ambulatory Visit: Payer: Medicaid Other

## 2022-06-07 ENCOUNTER — Ambulatory Visit: Payer: Medicaid Other

## 2022-06-18 ENCOUNTER — Encounter: Payer: Self-pay | Admitting: Obstetrics & Gynecology

## 2022-06-18 ENCOUNTER — Other Ambulatory Visit (INDEPENDENT_AMBULATORY_CARE_PROVIDER_SITE_OTHER): Payer: Self-pay | Admitting: Obstetrics & Gynecology

## 2022-06-18 ENCOUNTER — Encounter: Payer: Self-pay | Admitting: Obstetrics and Gynecology

## 2022-06-28 ENCOUNTER — Encounter: Payer: Self-pay | Admitting: Obstetrics and Gynecology

## 2022-06-28 ENCOUNTER — Other Ambulatory Visit: Payer: Self-pay

## 2022-06-28 ENCOUNTER — Ambulatory Visit: Payer: Medicaid Other | Admitting: Obstetrics and Gynecology

## 2022-06-28 DIAGNOSIS — R87613 High grade squamous intraepithelial lesion on cytologic smear of cervix (HGSIL): Secondary | ICD-10-CM | POA: Diagnosis not present

## 2022-06-28 DIAGNOSIS — O24419 Gestational diabetes mellitus in pregnancy, unspecified control: Secondary | ICD-10-CM

## 2022-06-28 HISTORY — DX: Gestational diabetes mellitus in pregnancy, unspecified control: O24.419

## 2022-06-28 NOTE — Progress Notes (Signed)
    Post Partum Visit Note  Tonya Walls is a 32 y.o. [id number] s/p 3/3 PLTCS and bilateral salpingectomy at 33wks for PTL, twin A breech at 38. Preg c/b BMI 50s, GDMA1, ?CHTN, bicornuate uterus, short interval pregnancy, HSIL pap  Anesthesia: epidural. Postpartum course has been good. Baby is doing well. Baby is feeding by bottle - Similac Neosure. Bleeding no bleeding. Bowel function is normal. Bladder function is normal. Patient is not sexually active. Contraception method is tubal ligation. Postpartum depression screening: negative.    Edinburgh Postnatal Depression Scale - 06/28/22 0820       Edinburgh Postnatal Depression Scale:  In the Past 7 Days   I have been able to laugh and see the funny side of things. 0    I have looked forward with enjoyment to things. 0    I have blamed myself unnecessarily when things went wrong. 0    I have been anxious or worried for no good reason. 0    I have felt scared or panicky for no good reason. 0    Things have been getting on top of me. 0    I have been so unhappy that I have had difficulty sleeping. 0    I have felt sad or miserable. 0    I have been so unhappy that I have been crying. 0    The thought of harming myself has occurred to me. 0    Edinburgh Postnatal Depression Scale Total 0            Review of Systems Pertinent items noted in HPI and remainder of comprehensive ROS otherwise negative.  Objective:  BP 120/63   Pulse 95   Wt (!) 302 lb 9.6 oz (137.3 kg)   LMP 09/23/2021   Breastfeeding No   BMI 53.60 kg/m    General: NAD Abdomen: soft, nttp, nd, incision looks great  Assessment:   Normal PP visit  Plan:   Essential components of care per ACOG recommendations:  *PP: routine care. Period hasn't started, she thinks, although she may have had one recently. Pt never pumped or breastfed and had qmonth period, non-painful, regular prior to pregnancy. D/w her to keep an eye out and definitive menses should start  soon *HSIL pap: pt to come back later this week for colpo and PP GTT. Colpo not done during the pregnancy and pt not aware of this. D/w her importance of colpo/biopsy now that she's PP to eval for pre-cancer cells.  *GDMA1: see above  Patient has PCP and d/w her to call them for a routine check up sometime this year.     Las Flores Bing, MD Center for [employer], [facility]

## 2022-07-01 ENCOUNTER — Encounter: Payer: Self-pay | Admitting: Obstetrics and Gynecology

## 2022-07-01 ENCOUNTER — Other Ambulatory Visit: Payer: Self-pay

## 2022-07-01 ENCOUNTER — Other Ambulatory Visit (HOSPITAL_COMMUNITY)
Admission: RE | Admit: 2022-07-01 | Discharge: 2022-07-01 | Disposition: A | Discharge status: Home / Self Care | Payer: Medicaid Other | Source: Ambulatory Visit | Attending: Obstetrics and Gynecology | Admitting: Obstetrics and Gynecology

## 2022-07-01 ENCOUNTER — Other Ambulatory Visit (INDEPENDENT_AMBULATORY_CARE_PROVIDER_SITE_OTHER): Payer: Medicaid Other

## 2022-07-01 ENCOUNTER — Ambulatory Visit (INDEPENDENT_AMBULATORY_CARE_PROVIDER_SITE_OTHER): Payer: Medicaid Other | Admitting: Obstetrics and Gynecology

## 2022-07-01 VITALS — BP 110/58 | HR 85 | Wt 308.0 lb

## 2022-07-01 DIAGNOSIS — R109 Unspecified abdominal pain: Secondary | ICD-10-CM

## 2022-07-01 DIAGNOSIS — R87613 High grade squamous intraepithelial lesion on cytologic smear of cervix (HGSIL): Secondary | ICD-10-CM | POA: Diagnosis not present

## 2022-07-01 DIAGNOSIS — O24419 Gestational diabetes mellitus in pregnancy, unspecified control: Secondary | ICD-10-CM

## 2022-07-01 DIAGNOSIS — Z01812 Encounter for preprocedural laboratory examination: Secondary | ICD-10-CM

## 2022-07-01 HISTORY — PX: COLPOSCOPY W/ BIOPSY / CURETTAGE: SUR283

## 2022-07-01 LAB — POCT PREGNANCY, URINE: Preg Test, Ur: NEGATIVE

## 2022-07-01 MED ORDER — IBUPROFEN 800 MG PO TABS
800.0000 mg | ORAL_TABLET | Freq: Once | ORAL | Status: AC
  Administered 2022-07-01: 800 mg via ORAL

## 2022-07-01 NOTE — Procedures (Signed)
Colposcopy Procedure Note  Pre-operative Diagnosis:  01/2022 new OB pap: HSIL No colposcopy done during the pregnancy 12/2018 pap: negative 05/2015 pap: negative  Post-operative Diagnosis: CIN 2  Procedure Details  Urine pregnancy test: negative. Cervical exam performed in the presence of a chaperone The risks (including infection, bleeding, pain) and benefits of the procedure were explained to the patient and written informed consent was obtained.  The patient was placed in the dorsal lithotomy position. A Graves was speculum inserted in the vagina, and the cervix was visualized.  Acetic acid staining was done and the cervix was viewed with green filter.  Biopsy from 12, 4 and 7 o'clock done and then single toothed tenaculum applied and endocervical curettage in all four quadrants done. There was no bleeding after procedure wit Monsels application.   Findings: diffuse AWE changes, particularly at 4-7 o'clock with increased vascularity there  Adequate: Yes  Specimens: 12, 4 and 7 o'clock (sent together). Endocervical curettage  Condition: Stable  Complications: None  Plan: The patient was advised to call for any fever or for prolonged or severe pain or bleeding. She was advised to use OTC analgesics as needed for mild to moderate pain. She was advised to avoid vaginal intercourse for 48 hours or until the bleeding has completely stopped.   Cornelia Copa MD Attending Center for [employer] Midwife)

## 2022-07-01 NOTE — Addendum Note (Signed)
Addended by: Maxwell Marion E on: 07/01/2022 10:13 AM   Modules accepted: Orders

## 2022-07-02 ENCOUNTER — Encounter: Payer: Self-pay | Admitting: Obstetrics and Gynecology

## 2022-07-02 LAB — GLUCOSE TOLERANCE, 2 HOURS
Glucose, 2 hour: 79 mg/dL (ref 70–139)
Glucose, GTT - Fasting: 90 mg/dL (ref 70–99)

## 2022-07-02 LAB — SURGICAL PATHOLOGY

## 2022-07-15 ENCOUNTER — Ambulatory Visit: Payer: Medicaid Other | Admitting: Obstetrics and Gynecology

## 2023-06-25 ENCOUNTER — Emergency Department (HOSPITAL_COMMUNITY)

## 2023-06-25 ENCOUNTER — Emergency Department (HOSPITAL_COMMUNITY)
Admission: EM | Admit: 2023-06-25 | Discharge: 2023-06-25 | Disposition: A | Discharge status: Home / Self Care | Attending: Emergency Medicine | Admitting: Emergency Medicine

## 2023-06-25 ENCOUNTER — Encounter (HOSPITAL_COMMUNITY): Payer: Self-pay

## 2023-06-25 ENCOUNTER — Other Ambulatory Visit: Payer: Self-pay

## 2023-06-25 DIAGNOSIS — M25571 Pain in right ankle and joints of right foot: Secondary | ICD-10-CM | POA: Diagnosis not present

## 2023-06-25 DIAGNOSIS — Y92410 Unspecified street and highway as the place of occurrence of the external cause: Secondary | ICD-10-CM | POA: Diagnosis not present

## 2023-06-25 DIAGNOSIS — M79674 Pain in right toe(s): Secondary | ICD-10-CM | POA: Diagnosis present

## 2023-06-25 MED ORDER — IBUPROFEN 400 MG PO TABS
400.0000 mg | ORAL_TABLET | Freq: Once | ORAL | Status: AC | PRN
  Administered 2023-06-25: 400 mg via ORAL
  Filled 2023-06-25: qty 1

## 2023-06-25 MED ORDER — ACETAMINOPHEN 500 MG PO TABS
1000.0000 mg | ORAL_TABLET | Freq: Once | ORAL | Status: AC
  Administered 2023-06-25: 1000 mg via ORAL
  Filled 2023-06-25: qty 2
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
